# Patient Record
Sex: Male | Born: 1945
Health system: Southern US, Community
[De-identification: ages and names within clinical notes are randomized; demographics above are authoritative.]

## PROBLEM LIST (undated history)

## (undated) DIAGNOSIS — M1990 Unspecified osteoarthritis, unspecified site: Secondary | ICD-10-CM

## (undated) DIAGNOSIS — F329 Major depressive disorder, single episode, unspecified: Secondary | ICD-10-CM

## (undated) DIAGNOSIS — F32A Depression, unspecified: Secondary | ICD-10-CM

## (undated) DIAGNOSIS — G4730 Sleep apnea, unspecified: Secondary | ICD-10-CM

## (undated) DIAGNOSIS — I10 Essential (primary) hypertension: Secondary | ICD-10-CM

## (undated) HISTORY — PX: CHOLECYSTECTOMY: SHX55

---

## 2005-03-21 ENCOUNTER — Ambulatory Visit: Payer: Self-pay | Admitting: Rheumatology

## 2005-05-16 ENCOUNTER — Ambulatory Visit: Payer: Self-pay | Admitting: Orthopaedic Surgery

## 2006-12-22 ENCOUNTER — Inpatient Hospital Stay: Payer: Self-pay | Admitting: General Surgery

## 2006-12-24 ENCOUNTER — Other Ambulatory Visit: Payer: Self-pay

## 2006-12-26 ENCOUNTER — Other Ambulatory Visit: Payer: Self-pay

## 2007-01-17 ENCOUNTER — Inpatient Hospital Stay: Payer: Self-pay | Admitting: Unknown Physician Specialty

## 2007-01-19 ENCOUNTER — Other Ambulatory Visit: Payer: Self-pay

## 2007-03-26 ENCOUNTER — Ambulatory Visit: Payer: Self-pay | Admitting: Unknown Physician Specialty

## 2008-03-13 ENCOUNTER — Ambulatory Visit: Payer: Self-pay | Admitting: Psychiatry

## 2008-03-15 ENCOUNTER — Inpatient Hospital Stay: Payer: Self-pay | Admitting: Unknown Physician Specialty

## 2008-03-19 ENCOUNTER — Other Ambulatory Visit: Payer: Self-pay

## 2008-04-10 ENCOUNTER — Ambulatory Visit: Payer: Self-pay | Admitting: Internal Medicine

## 2008-04-10 ENCOUNTER — Other Ambulatory Visit: Payer: Self-pay

## 2008-04-10 ENCOUNTER — Ambulatory Visit: Payer: Self-pay | Admitting: Psychiatry

## 2008-06-09 ENCOUNTER — Encounter: Payer: Self-pay | Admitting: Psychiatry

## 2008-06-29 ENCOUNTER — Emergency Department: Payer: Self-pay | Admitting: Emergency Medicine

## 2008-07-01 ENCOUNTER — Ambulatory Visit: Payer: Self-pay | Admitting: Urology

## 2008-07-07 ENCOUNTER — Ambulatory Visit: Payer: Self-pay | Admitting: Urology

## 2008-07-08 ENCOUNTER — Encounter: Payer: Self-pay | Admitting: Psychiatry

## 2008-07-15 ENCOUNTER — Ambulatory Visit: Payer: Self-pay | Admitting: Urology

## 2008-07-16 ENCOUNTER — Ambulatory Visit: Payer: Self-pay | Admitting: Urology

## 2008-07-16 ENCOUNTER — Other Ambulatory Visit: Payer: Self-pay

## 2008-07-17 ENCOUNTER — Ambulatory Visit: Payer: Self-pay | Admitting: Urology

## 2008-07-25 ENCOUNTER — Ambulatory Visit: Payer: Self-pay | Admitting: Urology

## 2008-09-08 ENCOUNTER — Ambulatory Visit: Payer: Self-pay | Admitting: Internal Medicine

## 2008-11-10 ENCOUNTER — Ambulatory Visit: Payer: Self-pay | Admitting: Psychiatry

## 2009-04-30 ENCOUNTER — Other Ambulatory Visit: Payer: Self-pay | Admitting: Psychiatry

## 2009-06-03 ENCOUNTER — Other Ambulatory Visit: Payer: Self-pay | Admitting: Psychiatry

## 2009-06-23 ENCOUNTER — Ambulatory Visit: Payer: Self-pay | Admitting: Internal Medicine

## 2009-07-09 ENCOUNTER — Other Ambulatory Visit: Payer: Self-pay | Admitting: Psychiatry

## 2009-07-17 ENCOUNTER — Ambulatory Visit: Payer: Self-pay | Admitting: Cardiology

## 2009-08-18 ENCOUNTER — Encounter: Payer: Self-pay | Admitting: Orthopedic Surgery

## 2009-09-07 ENCOUNTER — Encounter: Payer: Self-pay | Admitting: Orthopedic Surgery

## 2009-09-15 ENCOUNTER — Ambulatory Visit: Payer: Self-pay | Admitting: Orthopedic Surgery

## 2009-10-21 ENCOUNTER — Inpatient Hospital Stay: Payer: Self-pay | Admitting: Psychiatry

## 2009-10-21 ENCOUNTER — Ambulatory Visit: Payer: Self-pay | Admitting: Cardiology

## 2010-02-03 IMAGING — CT CT STONE STUDY
1 of 2 series · 15 of 32 positions shown, 19 images · non-contrast
Comparison: none

REASON FOR EXAM: flank pain        rm 17
COMMENTS:

PROCEDURE:     CT  - CT ABDOMEN /PELVIS WO (STONE)  - June 29, 2008  [DATE]
RESULT:     Indication: Renal stone.
TECHNIQUE: A standard renal stone protocol was performed with sequential 5
mm noncontrasted axial images from the lung bases to the symphysis pubis
with the patient in a supine position.

[Series 2: stone · axial · 0.84mm/px · z∈[-921,-453]mm · 15 of 172 slices shown, 19 images]
[im 8/172  soft-tissue]
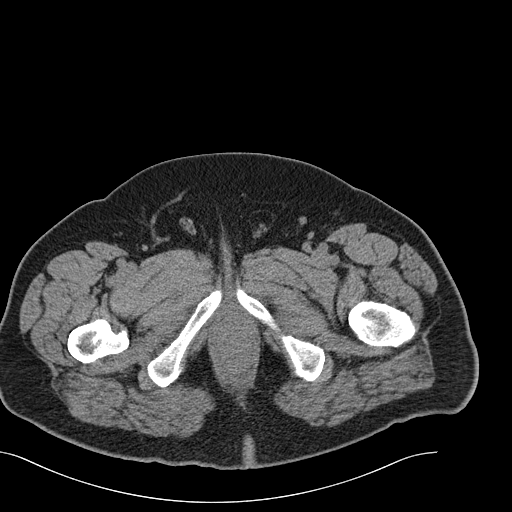
[im 8/172  bone]
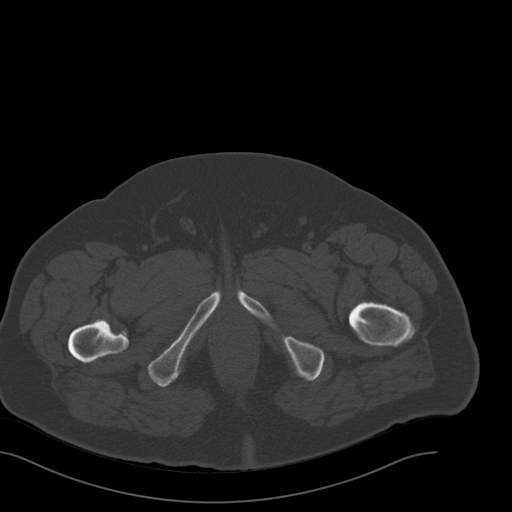
[im 22/172  soft-tissue]
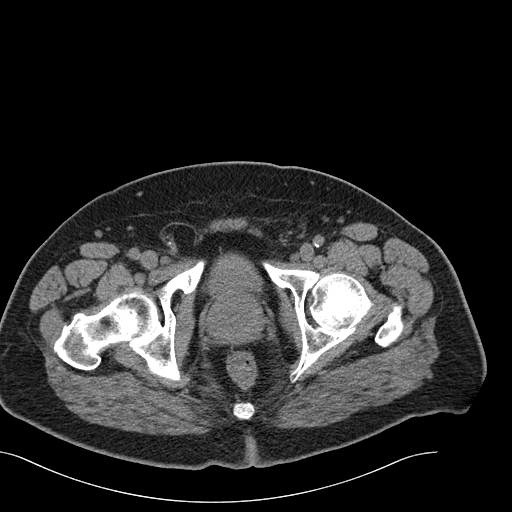
[im 36/172  soft-tissue]
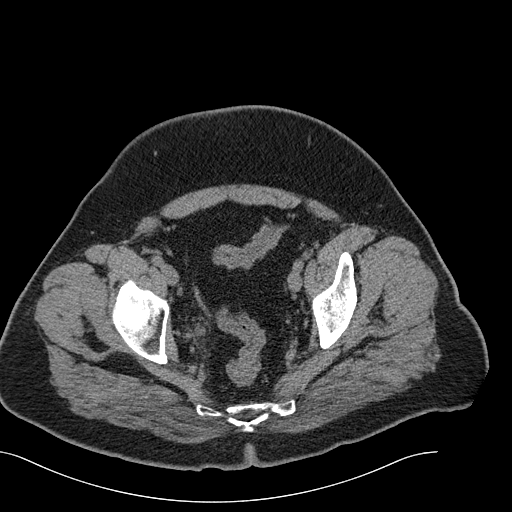
[im 50/172  soft-tissue]
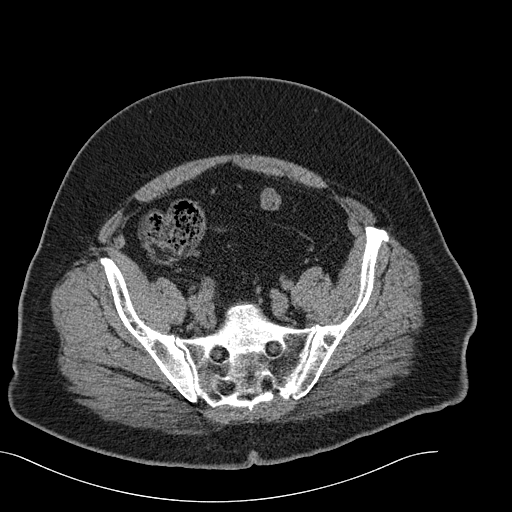
[im 58/172  soft-tissue]
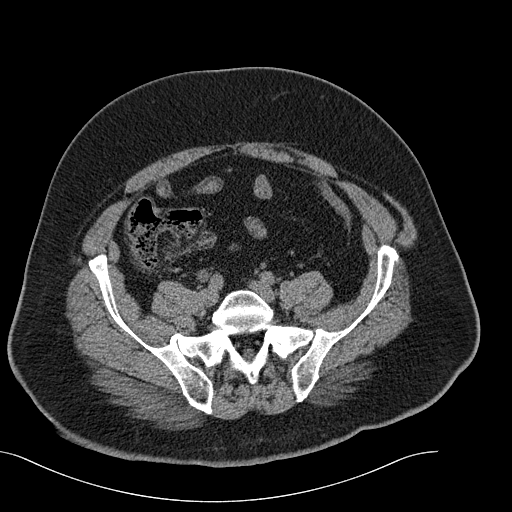
[im 72/172  soft-tissue]
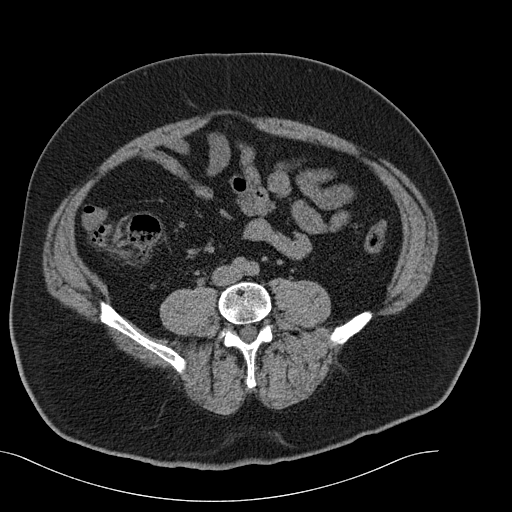
[im 86/172  soft-tissue]
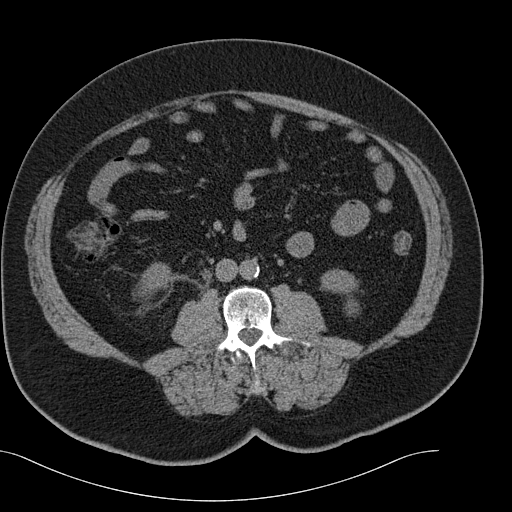
[im 100/172  soft-tissue]
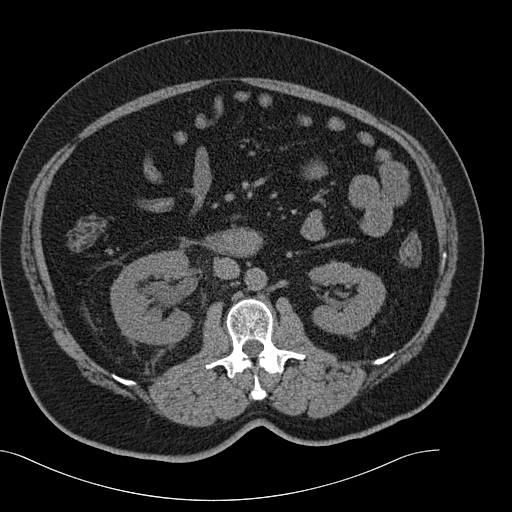
[im 115/172  soft-tissue]
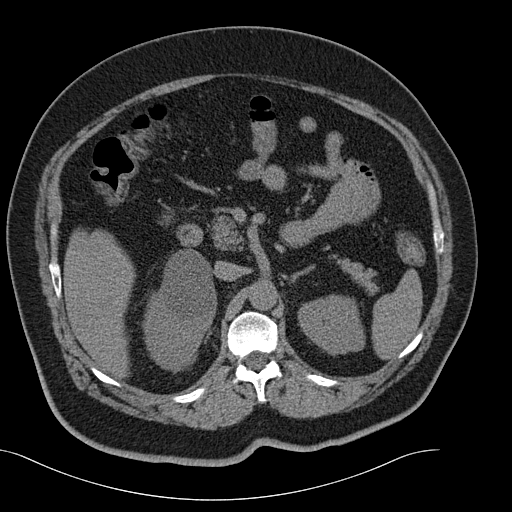
[im 115/172  bone]
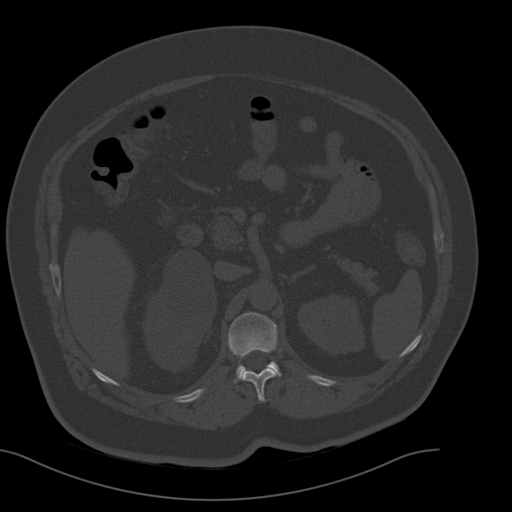
[im 122/172  soft-tissue]
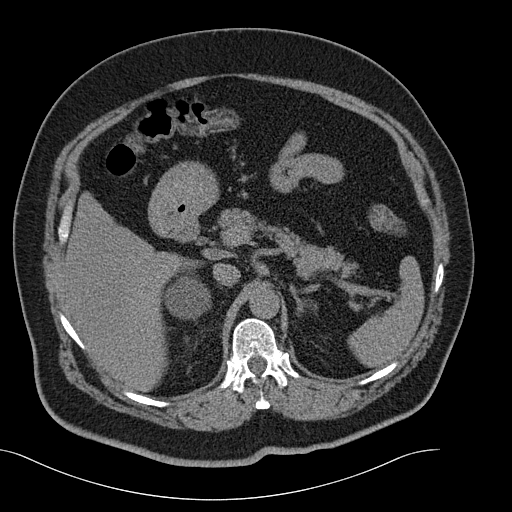
[im 136/172  soft-tissue]
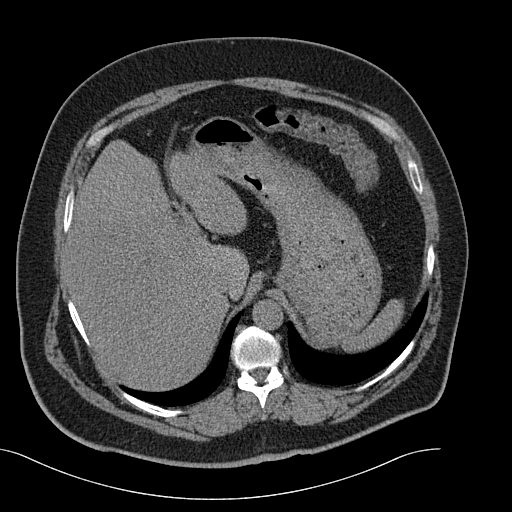
[im 143/172  lung]
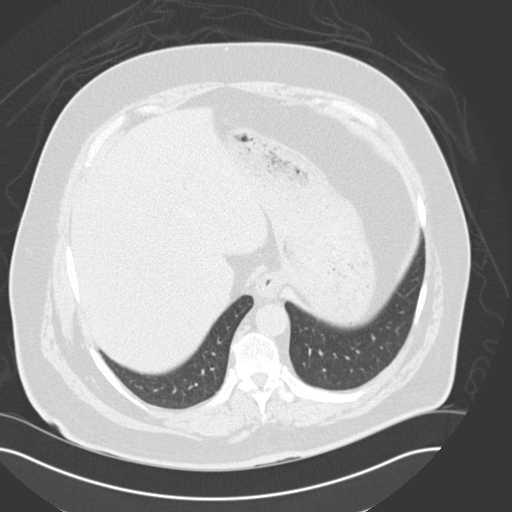
[im 150/172  soft-tissue]
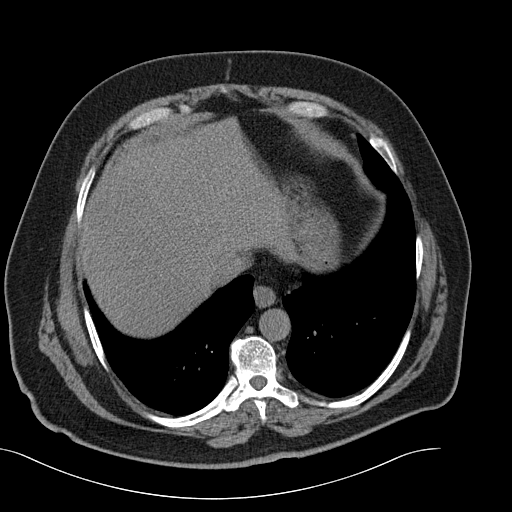
[im 150/172  lung]
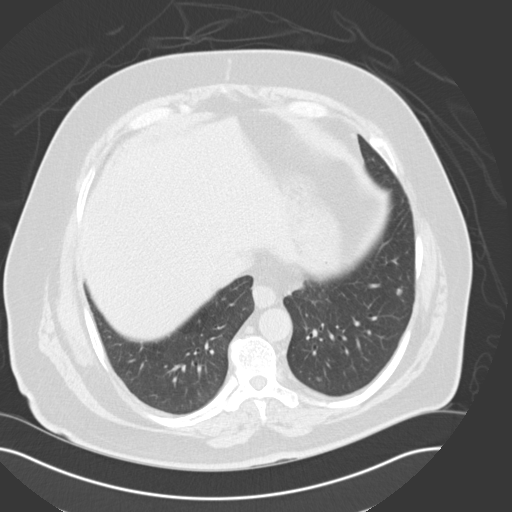
[im 157/172  lung]
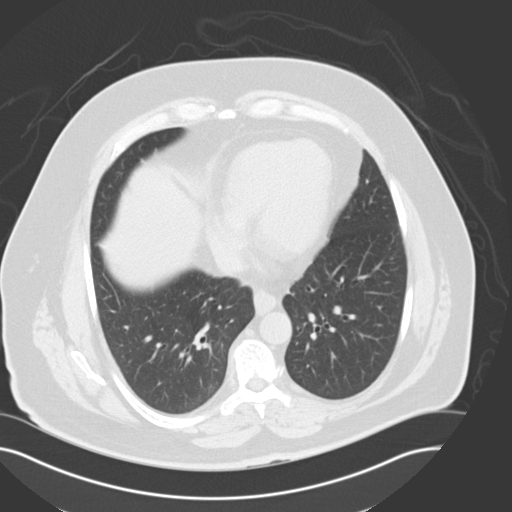
[im 164/172  soft-tissue]
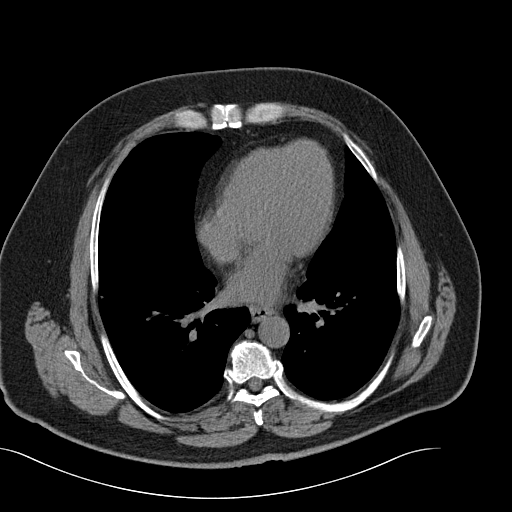
[im 164/172  lung]
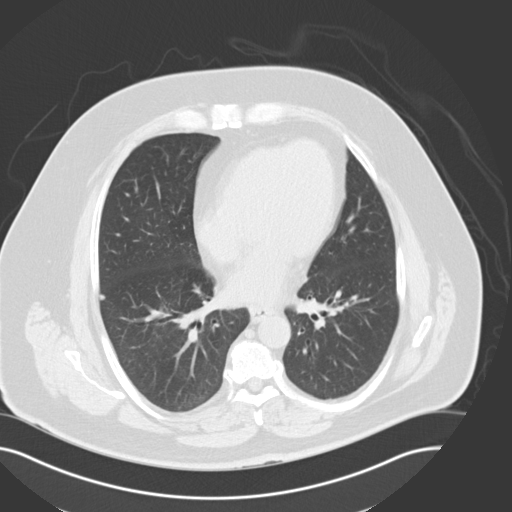

[15 of 32 positions shown; findings below may reference images not displayed]

FINDINGS: Limited views of the lung bases fail to demonstrate abnormal parenchymal
nodules or pleural or pericardial effusions.

There is a 6 mm distal right ureteral obstructing calculus with mild
hydroureter and mild hydronephrosis. The right kidney is mildly enlarged
with perinephric stranding. There are no other renal, ureteral, or bladder
calculi. There is a 6 x 4.7 cm hypodense, fluid attenuating mass arising
from the upper pole of the right kidney likely representing a cyst. There is
a hypodense, fluid attenuating, 2.3 x 2.2 cm left inferior pole renal mass
most consistent with a cyst. The bladder is unremarkable.

The liver demonstrates no focal abnormality. Prior cholecystectomy. The
spleen demonstrates no focal abnormality. The adrenal glands and pancreas
are normal. Mild prostatic enlargement.

The unopacified stomach, duodenum, small intestine, and large intestine are
unremarkable, but evaluation is limited by lack of oral contrast. There is
no pneumoperitoneum, pneumatosis, or portal venous gas. There is no
abdominal or pelvic free fluid. There is no lymphadenopathy.

The abdominal aorta is normal in caliber.

The osseous structures are unremarkable.
IMPRESSION: 1. There is a 6 mm distal right ureteral obstructing calculus with mild
hydroureter and mild hydronephrosis. The right kidney is mildly enlarged
with perinephric stranding.

2. Bilateral renal cysts.

A preliminary interpretation was provided by [REDACTED] on 06/29/2008
at 7534 hours.

## 2010-04-20 ENCOUNTER — Other Ambulatory Visit: Payer: Self-pay | Admitting: Psychiatry

## 2010-11-09 ENCOUNTER — Other Ambulatory Visit: Payer: Self-pay | Admitting: Psychiatry

## 2011-03-01 ENCOUNTER — Other Ambulatory Visit: Payer: Self-pay | Admitting: Psychiatry

## 2011-12-22 ENCOUNTER — Other Ambulatory Visit: Payer: Self-pay | Admitting: Psychiatry

## 2011-12-22 LAB — LIPID PANEL
HDL Cholesterol: 31 mg/dL — ABNORMAL LOW (ref 40–60)
Ldl Cholesterol, Calc: 118 mg/dL — ABNORMAL HIGH (ref 0–100)

## 2012-06-19 ENCOUNTER — Ambulatory Visit: Payer: Self-pay | Admitting: Cardiology

## 2012-06-28 ENCOUNTER — Other Ambulatory Visit: Payer: Self-pay | Admitting: Psychiatry

## 2012-06-28 LAB — LIPID PANEL
Cholesterol: 213 mg/dL — ABNORMAL HIGH (ref 0–200)
Triglycerides: 227 mg/dL — ABNORMAL HIGH (ref 0–200)

## 2012-06-28 LAB — GLUCOSE, RANDOM: Glucose: 111 mg/dL — ABNORMAL HIGH (ref 65–99)

## 2014-06-11 DIAGNOSIS — I10 Essential (primary) hypertension: Secondary | ICD-10-CM | POA: Insufficient documentation

## 2014-06-11 DIAGNOSIS — M1990 Unspecified osteoarthritis, unspecified site: Secondary | ICD-10-CM | POA: Insufficient documentation

## 2014-06-11 DIAGNOSIS — E785 Hyperlipidemia, unspecified: Secondary | ICD-10-CM | POA: Insufficient documentation

## 2014-07-01 DIAGNOSIS — F32A Depression, unspecified: Secondary | ICD-10-CM | POA: Insufficient documentation

## 2014-07-01 DIAGNOSIS — F329 Major depressive disorder, single episode, unspecified: Secondary | ICD-10-CM | POA: Insufficient documentation

## 2015-12-30 DIAGNOSIS — E782 Mixed hyperlipidemia: Secondary | ICD-10-CM | POA: Diagnosis not present

## 2015-12-30 DIAGNOSIS — F329 Major depressive disorder, single episode, unspecified: Secondary | ICD-10-CM | POA: Diagnosis not present

## 2015-12-30 DIAGNOSIS — Z8679 Personal history of other diseases of the circulatory system: Secondary | ICD-10-CM | POA: Diagnosis not present

## 2015-12-30 DIAGNOSIS — G4733 Obstructive sleep apnea (adult) (pediatric): Secondary | ICD-10-CM | POA: Diagnosis not present

## 2015-12-30 DIAGNOSIS — I10 Essential (primary) hypertension: Secondary | ICD-10-CM | POA: Diagnosis not present

## 2016-01-19 DIAGNOSIS — G4733 Obstructive sleep apnea (adult) (pediatric): Secondary | ICD-10-CM | POA: Diagnosis not present

## 2016-01-19 DIAGNOSIS — I10 Essential (primary) hypertension: Secondary | ICD-10-CM | POA: Diagnosis not present

## 2016-01-19 DIAGNOSIS — E6609 Other obesity due to excess calories: Secondary | ICD-10-CM | POA: Diagnosis not present

## 2016-01-19 DIAGNOSIS — F329 Major depressive disorder, single episode, unspecified: Secondary | ICD-10-CM | POA: Diagnosis not present

## 2016-01-19 DIAGNOSIS — E782 Mixed hyperlipidemia: Secondary | ICD-10-CM | POA: Diagnosis not present

## 2016-02-15 ENCOUNTER — Emergency Department
Admission: EM | Admit: 2016-02-15 | Discharge: 2016-02-16 | Disposition: A | Discharge status: Other Acute Inpatient Hospital | Payer: PPO | Attending: Emergency Medicine | Admitting: Emergency Medicine

## 2016-02-15 DIAGNOSIS — R45851 Suicidal ideations: Secondary | ICD-10-CM | POA: Insufficient documentation

## 2016-02-15 DIAGNOSIS — N400 Enlarged prostate without lower urinary tract symptoms: Secondary | ICD-10-CM

## 2016-02-15 DIAGNOSIS — F332 Major depressive disorder, recurrent severe without psychotic features: Secondary | ICD-10-CM | POA: Diagnosis not present

## 2016-02-15 DIAGNOSIS — F329 Major depressive disorder, single episode, unspecified: Secondary | ICD-10-CM | POA: Diagnosis not present

## 2016-02-15 DIAGNOSIS — F32A Depression, unspecified: Secondary | ICD-10-CM

## 2016-02-15 DIAGNOSIS — Z87891 Personal history of nicotine dependence: Secondary | ICD-10-CM | POA: Diagnosis not present

## 2016-02-15 HISTORY — DX: Major depressive disorder, single episode, unspecified: F32.9

## 2016-02-15 HISTORY — DX: Depression, unspecified: F32.A

## 2016-02-15 LAB — CBC
HEMATOCRIT: 42.4 % (ref 40.0–52.0)
Hemoglobin: 14.5 g/dL (ref 13.0–18.0)
MCH: 30.3 pg (ref 26.0–34.0)
MCHC: 34.2 g/dL (ref 32.0–36.0)
MCV: 88.7 fL (ref 80.0–100.0)
Platelets: 249 10*3/uL (ref 150–440)
RBC: 4.78 MIL/uL (ref 4.40–5.90)
RDW: 14.6 % — AB (ref 11.5–14.5)
WBC: 9.5 10*3/uL (ref 3.8–10.6)

## 2016-02-15 LAB — COMPREHENSIVE METABOLIC PANEL
ALBUMIN: 4.5 g/dL (ref 3.5–5.0)
ALK PHOS: 52 U/L (ref 38–126)
ALT: 26 U/L (ref 17–63)
AST: 21 U/L (ref 15–41)
Anion gap: 6 (ref 5–15)
BILIRUBIN TOTAL: 0.4 mg/dL (ref 0.3–1.2)
BUN: 10 mg/dL (ref 6–20)
CALCIUM: 9.2 mg/dL (ref 8.9–10.3)
CO2: 25 mmol/L (ref 22–32)
CREATININE: 0.68 mg/dL (ref 0.61–1.24)
Chloride: 105 mmol/L (ref 101–111)
GFR calc Af Amer: >60 mL/min (ref >60)
GFR calc non Af Amer: >60 mL/min (ref >60)
GLUCOSE: 94 mg/dL (ref 65–99)
Potassium: 4.1 mmol/L (ref 3.5–5.1)
Sodium: 136 mmol/L (ref 135–145)
TOTAL PROTEIN: 7.2 g/dL (ref 6.5–8.1)

## 2016-02-15 LAB — URINE DRUG SCREEN, QUALITATIVE (ARMC ONLY)
AMPHETAMINES, UR SCREEN: NOT DETECTED
BENZODIAZEPINE, UR SCRN: NOT DETECTED
Barbiturates, Ur Screen: NOT DETECTED
Cannabinoid 50 Ng, Ur ~~LOC~~: NOT DETECTED
Cocaine Metabolite,Ur ~~LOC~~: NOT DETECTED
MDMA (Ecstasy)Ur Screen: NOT DETECTED
Methadone Scn, Ur: NOT DETECTED
OPIATE, UR SCREEN: NOT DETECTED
PHENCYCLIDINE (PCP) UR S: NOT DETECTED
Tricyclic, Ur Screen: NOT DETECTED

## 2016-02-15 LAB — ACETAMINOPHEN LEVEL: Acetaminophen (Tylenol), Serum: <10 ug/mL — ABNORMAL LOW (ref 10–30)

## 2016-02-15 LAB — SALICYLATE LEVEL: Salicylate Lvl: <4 mg/dL (ref 2.8–30.0)

## 2016-02-15 LAB — ETHANOL: Alcohol, Ethyl (B): <5 mg/dL (ref <5)

## 2016-02-15 NOTE — ED Notes (Signed)
TTS consult now 

## 2016-02-15 NOTE — ED Notes (Signed)
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
## 2016-02-15 NOTE — ED Notes (Signed)
Pt is here with his wife and daughter, states he is having suicidal thoughts without intent, states he has a hx of depression and was seeing Dr. Claudie Fishermanhin, states he no longer practices and has not been able to establish care since..Marland Kitchen

## 2016-02-15 NOTE — ED Notes (Signed)
Supper provided along with an extra drink   

## 2016-02-15 NOTE — ED Provider Notes (Signed)
[facility] Emergency Department Provider Note  ____________________________________________  Time seen: ~1640  I have reviewed the triage vital signs and the nursing notes.   HISTORY  Chief Complaint Depression and Suicidal   History limited by: Not Limited   HPI Mario Knapp is a 70 y.o. male with history of depression who presents to the emergency department today because of concern for worsening depression. The patient states that he has had depression for years. He states that it has gotten worse recently. States that he is on medication for depression. Had been seeing a psychiatrist who has now retired. Denies any alcohol or drug use.     Past Medical History  Diagnosis Date  . Depression     There are no active problems to display for this patient.   Past Surgical History  Procedure Laterality Date  . Cholecystectomy      No current outpatient prescriptions on file.  Allergies Review of patient's allergies indicates no known allergies.  No family history on file.  Social History Social History  Substance Use Topics  . Smoking status: Former Games developer  . Smokeless tobacco: None  . Alcohol Use: No    Review of Systems  Constitutional: Negative for fever. Cardiovascular: Negative for chest pain. Respiratory: Negative for shortness of breath. Gastrointestinal: Negative for abdominal pain, vomiting and diarrhea. Neurological: Negative for headaches, focal weakness or numbness.  10-point ROS otherwise negative.  ____________________________________________   PHYSICAL EXAM:  VITAL SIGNS: ED Triage Vitals  Enc Vitals Group     BP 02/15/16 1526 133/76 mmHg     Pulse Rate 02/15/16 1526 79     Resp 02/15/16 1526 17     Temp 02/15/16 1526 97.7 F (36.5 C)     Temp Source 02/15/16 1526 Oral     SpO2 02/15/16 1526 95 %     Weight 02/15/16 1526 240 lb (108.863 kg)     Height 02/15/16 1526  (1.727 m)    Constitutional: Alert and oriented. Appears depressed.  Eyes: Conjunctivae are normal. PERRL. Normal extraocular movements. ENT   Head: Normocephalic and atraumatic.   Nose: No congestion/rhinnorhea.   Mouth/Throat: Mucous membranes are moist.   Neck: No stridor. Hematological/Lymphatic/Immunilogical: No cervical lymphadenopathy. Cardiovascular: Normal rate, regular rhythm.  No murmurs, rubs, or gallops. Respiratory: Normal respiratory effort without tachypnea nor retractions. Breath sounds are clear and equal bilaterally. No wheezes/rales/rhonchi. Gastrointestinal: Soft and nontender. No distention.  Genitourinary: Deferred Musculoskeletal: Normal range of motion in all extremities. No joint effusions.  No lower extremity tenderness nor edema. Neurologic:  Normal speech and language. No gross focal neurologic deficits are appreciated.  Skin:  Skin is warm, dry and intact. No rash noted. Psychiatric: Depressed. Tearful.   ____________________________________________    LABS (pertinent positives/negatives)  Labs Reviewed  ACETAMINOPHEN LEVEL - Abnormal; Notable for the following:    Acetaminophen (Tylenol), Serum <10 (*)    All other components within normal limits  CBC - Abnormal; Notable for the following:    RDW 14.6 (*)    All other components within normal limits  COMPREHENSIVE METABOLIC PANEL  ETHANOL  SALICYLATE LEVEL  URINE DRUG SCREEN, QUALITATIVE ([facility] ONLY)     ____________________________________________   EKG  None  ____________________________________________    RADIOLOGY  None  ____________________________________________   PROCEDURES  Procedure(s) performed: None  Critical Care performed: No  ____________________________________________   INITIAL IMPRESSION / ASSESSMENT AND PLAN / ED COURSE  Pertinent labs & imaging results that were available during my  care of the patient were reviewed by me and considered in my medical  decision making (see chart for details).  Patient presented to the emergency department today because of concerns for depression. Does state he has had some suicidal thoughts. Patient is here voluntarily. Patient without a plan. Did not feel that patient warranted IVC at this time. Will have psychiatry see patient.  ____________________________________________   FINAL CLINICAL IMPRESSION(S) / ED DIAGNOSES  Final diagnoses:  Depression     Phineas SemenGraydon Shayana Hornstein, MD 02/15/16 2344

## 2016-02-15 NOTE — ED Notes (Signed)
Pt watching tv.  Pt calm and cooperative.  Alert.

## 2016-02-15 NOTE — ED Notes (Signed)
BEHAVIORAL HEALTH ROUNDING Patient sleeping: No. Patient alert and oriented: yes Behavior appropriate: Yes.  ; If no, describe:  Nutrition and fluids offered: yes Toileting and hygiene offered: Yes  Sitter present: q15 minute observations and security  monitoring Law enforcement present: Yes  ODS  

## 2016-02-15 NOTE — ED Notes (Signed)
Wife visiting pt at this time.

## 2016-02-15 NOTE — ED Notes (Signed)
Pt is on cpap at home and has his machine.  Security and md aware.  Pt has now placed mask on his face to go to bed.  Security and sitter/rover aware.

## 2016-02-15 NOTE — ED Notes (Signed)
Pt observed lying in bed - watching TV   Pt visualized with NAD  No verbalized needs or concerns at this time  Continue to monitor 

## 2016-02-15 NOTE — ED Notes (Signed)
Resumed care from Sophia Cubero t rn.  Pt resting quietly   Calm and cooperative.

## 2016-02-16 ENCOUNTER — Inpatient Hospital Stay
Admission: EM | Admit: 2016-02-16 | Discharge: 2016-02-23 | DRG: 885 | Disposition: A | Discharge status: Home / Self Care | Payer: PPO | Source: Intra-hospital | Attending: Psychiatry | Admitting: Psychiatry

## 2016-02-16 DIAGNOSIS — F332 Major depressive disorder, recurrent severe without psychotic features: Secondary | ICD-10-CM | POA: Diagnosis present

## 2016-02-16 DIAGNOSIS — G4730 Sleep apnea, unspecified: Secondary | ICD-10-CM | POA: Diagnosis present

## 2016-02-16 DIAGNOSIS — R45851 Suicidal ideations: Secondary | ICD-10-CM | POA: Diagnosis present

## 2016-02-16 DIAGNOSIS — N400 Enlarged prostate without lower urinary tract symptoms: Secondary | ICD-10-CM

## 2016-02-16 DIAGNOSIS — M1990 Unspecified osteoarthritis, unspecified site: Secondary | ICD-10-CM | POA: Diagnosis not present

## 2016-02-16 DIAGNOSIS — Z9049 Acquired absence of other specified parts of digestive tract: Secondary | ICD-10-CM

## 2016-02-16 DIAGNOSIS — Z87891 Personal history of nicotine dependence: Secondary | ICD-10-CM

## 2016-02-16 DIAGNOSIS — I499 Cardiac arrhythmia, unspecified: Secondary | ICD-10-CM | POA: Diagnosis not present

## 2016-02-16 DIAGNOSIS — G4700 Insomnia, unspecified: Secondary | ICD-10-CM | POA: Diagnosis present

## 2016-02-16 DIAGNOSIS — E538 Deficiency of other specified B group vitamins: Secondary | ICD-10-CM | POA: Diagnosis not present

## 2016-02-16 DIAGNOSIS — G4733 Obstructive sleep apnea (adult) (pediatric): Secondary | ICD-10-CM | POA: Diagnosis not present

## 2016-02-16 DIAGNOSIS — F419 Anxiety disorder, unspecified: Secondary | ICD-10-CM | POA: Diagnosis not present

## 2016-02-16 DIAGNOSIS — F329 Major depressive disorder, single episode, unspecified: Secondary | ICD-10-CM | POA: Diagnosis not present

## 2016-02-16 HISTORY — DX: Unspecified osteoarthritis, unspecified site: M19.90

## 2016-02-16 HISTORY — DX: Sleep apnea, unspecified: G47.30

## 2016-02-16 MED ORDER — ADULT MULTIVITAMIN W/MINERALS CH
1.0000 | ORAL_TABLET | Freq: Every day | ORAL | Status: DC
  Administered 2016-02-17 – 2016-02-22 (×6): 1 via ORAL
  Filled 2016-02-16 (×6): qty 1

## 2016-02-16 MED ORDER — ESCITALOPRAM OXALATE 10 MG PO TABS
20.0000 mg | ORAL_TABLET | Freq: Two times a day (BID) | ORAL | Status: DC
  Administered 2016-02-16: 20 mg via ORAL
  Filled 2016-02-16: qty 2

## 2016-02-16 MED ORDER — IBUPROFEN 600 MG PO TABS
600.0000 mg | ORAL_TABLET | Freq: Once | ORAL | Status: AC
  Administered 2016-02-16: 600 mg via ORAL

## 2016-02-16 MED ORDER — ALUM & MAG HYDROXIDE-SIMETH 200-200-20 MG/5ML PO SUSP
30.0000 mL | ORAL | Status: DC | PRN
Start: 2016-02-16 — End: 2016-02-23

## 2016-02-16 MED ORDER — MIRTAZAPINE 15 MG PO TABS
45.0000 mg | ORAL_TABLET | Freq: Every day | ORAL | Status: DC
Start: 2016-02-16 — End: 2016-02-22
  Administered 2016-02-16 – 2016-02-21 (×6): 45 mg via ORAL
  Filled 2016-02-16 (×6): qty 3

## 2016-02-16 MED ORDER — MIRTAZAPINE 15 MG PO TABS: 45.0000 mg | ORAL_TABLET | Freq: Every day | ORAL | Status: DC

## 2016-02-16 MED ORDER — ACETAMINOPHEN 325 MG PO TABS
650.0000 mg | ORAL_TABLET | Freq: Four times a day (QID) | ORAL | Status: DC | PRN
  Administered 2016-02-20: 650 mg via ORAL
  Filled 2016-02-16: qty 2

## 2016-02-16 MED ORDER — MIRTAZAPINE 15 MG PO TABS: 30.0000 mg | ORAL_TABLET | Freq: Every day | ORAL | Status: DC

## 2016-02-16 MED ORDER — CLONAZEPAM 0.5 MG PO TABS: 0.5000 mg | ORAL_TABLET | Freq: Two times a day (BID) | ORAL | Status: DC | PRN

## 2016-02-16 MED ORDER — TAMSULOSIN HCL 0.4 MG PO CAPS
0.4000 mg | ORAL_CAPSULE | Freq: Every day | ORAL | Status: DC
  Administered 2016-02-16: 0.4 mg via ORAL
  Filled 2016-02-16: qty 1

## 2016-02-16 MED ORDER — TAMSULOSIN HCL 0.4 MG PO CAPS
0.4000 mg | ORAL_CAPSULE | Freq: Every day | ORAL | Status: DC
  Administered 2016-02-17 – 2016-02-22 (×6): 0.4 mg via ORAL
  Filled 2016-02-16 (×6): qty 1

## 2016-02-16 MED ORDER — ACETAMINOPHEN 325 MG PO TABS
650.0000 mg | ORAL_TABLET | Freq: Once | ORAL | Status: AC
  Administered 2016-02-16: 650 mg via ORAL

## 2016-02-16 MED ORDER — ADULT MULTIVITAMIN W/MINERALS CH
1.0000 | ORAL_TABLET | Freq: Every day | ORAL | Status: DC
  Administered 2016-02-16: 1 via ORAL
  Filled 2016-02-16: qty 1

## 2016-02-16 MED ORDER — IBUPROFEN 600 MG PO TABS
ORAL_TABLET | ORAL | Status: AC
  Administered 2016-02-16: 600 mg via ORAL
  Filled 2016-02-16: qty 1

## 2016-02-16 MED ORDER — ESCITALOPRAM OXALATE 10 MG PO TABS
20.0000 mg | ORAL_TABLET | Freq: Two times a day (BID) | ORAL | Status: DC
  Administered 2016-02-16 – 2016-02-17 (×2): 20 mg via ORAL
  Filled 2016-02-16 (×2): qty 2

## 2016-02-16 MED ORDER — CLONAZEPAM 0.5 MG PO TABS
0.5000 mg | ORAL_TABLET | Freq: Two times a day (BID) | ORAL | Status: DC | PRN
  Administered 2016-02-16: 0.5 mg via ORAL
  Filled 2016-02-16: qty 1

## 2016-02-16 MED ORDER — ACETAMINOPHEN 325 MG PO TABS
ORAL_TABLET | ORAL | Status: AC
  Administered 2016-02-16: 650 mg via ORAL
  Filled 2016-02-16: qty 2

## 2016-02-16 MED ORDER — MAGNESIUM HYDROXIDE 400 MG/5ML PO SUSP: 30.0000 mL | Freq: Every day | ORAL | Status: DC | PRN

## 2016-02-16 NOTE — ED Notes (Signed)
Pt sleeping  cpap in place.

## 2016-02-16 NOTE — Plan of Care (Signed)
Problem: Diagnosis: Increased Risk For Suicide Attempt Goal: LTG-Patient Will Show Positive Response to Medication LTG (by discharge) : Patient will show positive response to medication and will participate in the development of the discharge plan.  Outcome: Progressing AAOX3. Anxious and cooperative. Med compliant. No voiced thoughts of hurting himself. Pt remains free from injury.

## 2016-02-16 NOTE — ED Notes (Signed)
Pt switching his laying positions in bed, tv on. Pt calm and cooperative. No distress noted at this time.

## 2016-02-16 NOTE — ED Notes (Signed)
Psychiatrist at bedside

## 2016-02-16 NOTE — ED Notes (Signed)
Pt given breakfast tray with OJ and Cpap packaged by pt and removed from room. Pt is eating breakfast. [facility].

## 2016-02-16 NOTE — Consult Note (Signed)
[facility] Face-to-Face Psychiatry Consult   Reason for Consult:  Consult for this 70 year old man with a history of major depression came to the emergency room voluntarily because of suicidal threats. Referring Physician:  Cinda Quest Patient Identification: Fredy Gladu MRN:  [id number] Principal Diagnosis: Severe recurrent major depression without psychotic features [facility]) Diagnosis:   Patient Active Problem List   Diagnosis Date Noted  . Severe recurrent major depression without psychotic features ([place]) [F33.2] 02/16/2016  . Suicidal ideation [R45.851] 02/16/2016  . Benign hypertrophy of prostate [N40.0] 02/16/2016    Total Time spent with patient: 1 hour  Subjective:   Damone Fancher is a 70 y.o. male patient admitted with "I just got overwhelmed and threatened to harm myself".  HPI:  Patient interviewed. Chart reviewed. Labs reviewed case discussed with emergency room doctor and TTS. 70 year old man says that he is been feeling more and more overwhelmed recently. Probably been going on for months but it's escalating over the last several days. He feels like he is under a huge amount of stress. He has to care for his elderly mother in his home and finds that to be very stressful. He says that the last straw was this weekend when his daughter yelled at him over something. Patient then admits that he said that he was going to kill himself. He got in his car and went to drive to "talk to my son". The son in question is deceased and apparently the patient had made some comments about joining him. He also made a comment about how if he had a gun he would shoot himself. Has been compliant with his psychiatric medicine but has not had a psychiatric provider since his doctor retired a few months ago. He denies that he's been drinking or abusing any drugs.  Social history: Patient lives with his wife. Sounds like he doesn't talk to her much about what is stressing him. He has 4 children one of whom is  deceased. Sounds like the others often get on his nerves and fuss with him or at least that's how he sees it. His elderly mother lives with him. He complains of taking care of her because he says that she never cared about him when he was a child anyway.  Substance abuse history: Patient denies any alcohol or drug abuse now or in the past.  Medical history: History of enlarged prostate. Takes Flomax.. Otherwise denies any ongoing medical problems    Past Psychiatric History: Patient admits that he's had 2 prior psychiatric hospitalizations. They predate the records in our computer but he says both of them happened at [facility]. He denies that he has tried to kill himself in the past. Admits that he's made suicidal statements before. He was seeing Dr. Ulis Rias in the community for years for outpatient treatment but Dr. Bridgett Larsson retired and moved away a few months ago and the patient has not been able to find a doctor to substitute. He takes a combination of Lexapro and Remeron.  Risk to Self: Suicidal Ideation: Yes-Currently Present Suicidal Intent: Yes-Currently Present Is patient at risk for suicide?: Yes (Currently in the hospital) Suicidal Plan?: Yes-Currently Present Specify Current Suicidal Plan: vehicular suicide Access to Means: No (Currently in the hospital) What has been your use of drugs/alcohol within the last 12 months?: denied use How many times?: 0 Other Self Harm Risks: denied Triggers for Past Attempts: None known Intentional Self Injurious Behavior: None Risk to Others: Homicidal Ideation: No Thoughts of Harm  to Others: No Current Homicidal Intent: No Current Homicidal Plan: No Access to Homicidal Means: No Identified Victim: none identified History of harm to others?: No Assessment of Violence: None Noted Violent Behavior Description: denied Does patient have access to weapons?: No Criminal Charges Pending?: No Does patient have a court date:  No Prior Inpatient Therapy: Prior Inpatient Therapy: Yes Prior Therapy Dates: 2009 Prior Therapy Facilty/Provider(s): [facility] Reason for Treatment: Depression Prior Outpatient Therapy: Prior Outpatient Therapy: Yes Prior Therapy Dates: 2016 Prior Therapy Facilty/Provider(s): Dr. Bridgett Larsson Reason for Treatment: Depression Does patient have an ACCT team?: No Does patient have Intensive In-House Services?  : No Does patient have [facility] services? : No Does patient have P4CC services?: No  Past Medical History:  Past Medical History  Diagnosis Date  . Depression     Past Surgical History  Procedure Laterality Date  . Cholecystectomy     Family History: No family history on file. Family Psychiatric  History: Patient says that both of his daughters have major depression known in his family has killed themselves and there is no family history of alcohol or drug abuse Social History:  History  Alcohol Use No     History  Drug Use No    Social History   Social History  . Marital Status: Married    Spouse Name: N/A  . Number of Children: N/A  . Years of Education: N/A   Social History Main Topics  . Smoking status: Former Research scientist (life sciences)  . Smokeless tobacco: None  . Alcohol Use: No  . Drug Use: No  . Sexual Activity: Not Asked   Other Topics Concern  . None   Social History Narrative  . None   Additional Social History:    Allergies:  No Known Allergies  Labs:  Results for orders placed or performed during the hospital encounter of 02/15/16 (from the past 48 hour(s))  Comprehensive metabolic panel     Status: None   Collection Time: 02/15/16  3:33 PM  Result Value Ref Range   Sodium 136 135 - 145 mmol/L   Potassium 4.1 3.5 - 5.1 mmol/L   Chloride 105 101 - 111 mmol/L   CO2 25 22 - 32 mmol/L   Glucose, Bld 94 65 - 99 mg/dL   BUN 10 6 - 20 mg/dL   Creatinine, Ser 0.68 0.61 - 1.24 mg/dL   Calcium 9.2 8.9 - 10.3 mg/dL   Total Protein 7.2 6.5 - 8.1 g/dL   Albumin 4.5 3.5 -  5.0 g/dL   AST 21 15 - 41 U/L   ALT 26 17 - 63 U/L   Alkaline Phosphatase 52 38 - 126 U/L   Total Bilirubin 0.4 0.3 - 1.2 mg/dL   GFR calc non Af Amer >60 >60 mL/min   GFR calc Af Amer >60 >60 mL/min    Comment: (NOTE) The eGFR has been calculated using the CKD EPI equation. This calculation has not been validated in all clinical situations. eGFR's persistently <60 mL/min signify possible Chronic Kidney Disease.    Anion gap 6 5 - 15  Ethanol (ETOH)     Status: None   Collection Time: 02/15/16  3:33 PM  Result Value Ref Range   Alcohol, Ethyl (B) <5 <5 mg/dL    Comment:        LOWEST DETECTABLE LIMIT FOR SERUM ALCOHOL IS 5 mg/dL FOR MEDICAL PURPOSES ONLY   Salicylate level     Status: None   Collection Time: 02/15/16  3:33 PM  Result Value Ref Range   Salicylate Lvl <7.8 2.8 - 30.0 mg/dL  Acetaminophen level     Status: Abnormal   Collection Time: 02/15/16  3:33 PM  Result Value Ref Range   Acetaminophen (Tylenol), Serum <10 (L) 10 - 30 ug/mL    Comment:        THERAPEUTIC CONCENTRATIONS VARY SIGNIFICANTLY. A RANGE OF 10-30 ug/mL MAY BE AN EFFECTIVE CONCENTRATION FOR MANY PATIENTS. HOWEVER, SOME ARE BEST TREATED AT CONCENTRATIONS OUTSIDE THIS RANGE. ACETAMINOPHEN CONCENTRATIONS >150 ug/mL AT 4 HOURS AFTER INGESTION AND >50 ug/mL AT 12 HOURS AFTER INGESTION ARE OFTEN ASSOCIATED WITH TOXIC REACTIONS.   CBC     Status: Abnormal   Collection Time: 02/15/16  3:33 PM  Result Value Ref Range   WBC 9.5 3.8 - 10.6 K/uL   RBC 4.78 4.40 - 5.90 MIL/uL   Hemoglobin 14.5 13.0 - 18.0 g/dL   HCT 42.4 40.0 - 52.0 %   MCV 88.7 80.0 - 100.0 fL   MCH 30.3 26.0 - 34.0 pg   MCHC 34.2 32.0 - 36.0 g/dL   RDW 14.6 (H) 11.5 - 14.5 %   Platelets 249 150 - 440 K/uL  Urine Drug Screen, Qualitative ([facility] only)     Status: None   Collection Time: 02/15/16  3:33 PM  Result Value Ref Range   Tricyclic, Ur Screen NONE DETECTED NONE DETECTED   Amphetamines, Ur Screen NONE DETECTED NONE  DETECTED   MDMA (Ecstasy)Ur Screen NONE DETECTED NONE DETECTED   Cocaine Metabolite,Ur [place] NONE DETECTED NONE DETECTED   Opiate, Ur Screen NONE DETECTED NONE DETECTED   Phencyclidine (PCP) Ur S NONE DETECTED NONE DETECTED   Cannabinoid 50 Ng, Ur [place] NONE DETECTED NONE DETECTED   Barbiturates, Ur Screen NONE DETECTED NONE DETECTED   Benzodiazepine, Ur Scrn NONE DETECTED NONE DETECTED   Methadone Scn, Ur NONE DETECTED NONE DETECTED    Comment: (NOTE) 469  Tricyclics, urine               Cutoff 1000 ng/mL 200  Amphetamines, urine             Cutoff 1000 ng/mL 300  MDMA (Ecstasy), urine           Cutoff 500 ng/mL 400  Cocaine Metabolite, urine       Cutoff 300 ng/mL 500  Opiate, urine                   Cutoff 300 ng/mL 600  Phencyclidine (PCP), urine      Cutoff 25 ng/mL 700  Cannabinoid, urine              Cutoff 50 ng/mL 800  Barbiturates, urine             Cutoff 200 ng/mL 900  Benzodiazepine, urine           Cutoff 200 ng/mL 1000 Methadone, urine                Cutoff 300 ng/mL 1100 1200 The urine drug screen provides only a preliminary, unconfirmed 1300 analytical test result and should not be used for non-medical 1400 purposes. Clinical consideration and professional judgment should 1500 be applied to any positive drug screen result due to possible 1600 interfering substances. A more specific alternate chemical method 1700 must be used in order to obtain a confirmed analytical result.  1800 Gas chromato graphy / mass spectrometry (GC/MS) is the preferred 1900 confirmatory method.     Current Facility-Administered Medications  Medication Dose Route Frequency Provider Last Rate Last Dose  . clonazePAM (KLONOPIN) tablet 0.5 mg  0.5 mg Oral BID PRN Nena Polio, MD      . escitalopram (LEXAPRO) tablet 20 mg  20 mg Oral BID Nena Polio, MD   20 mg at 02/16/16 1058  . mirtazapine (REMERON) tablet 45 mg  45 mg Oral QHS Gonzella Lex, MD      . multivitamin with minerals tablet 1  tablet  1 tablet Oral Daily Nena Polio, MD   1 tablet at 02/16/16 1057  . tamsulosin (FLOMAX) capsule 0.4 mg  0.4 mg Oral QPC supper Nena Polio, MD       Current Outpatient Prescriptions  Medication Sig Dispense Refill  . clonazePAM (KLONOPIN) 0.5 MG tablet Take 0.5 mg by mouth 2 (two) times daily as needed for anxiety.    Marland Kitchen escitalopram (LEXAPRO) 20 MG tablet Take 20 mg by mouth 2 (two) times daily.    . mirtazapine (REMERON) 30 MG tablet Take 30 mg by mouth at bedtime.    . Multiple Vitamin (MULTIVITAMIN WITH MINERALS) TABS tablet Take 1 tablet by mouth daily.    . tamsulosin (FLOMAX) 0.4 MG CAPS capsule Take 0.4 mg by mouth daily after supper.      Musculoskeletal: Strength & Muscle Tone: within normal limits Gait & Station: normal Patient leans: N/A  Psychiatric Specialty Exam: Review of Systems  Constitutional: Negative.   HENT: Negative.   Eyes: Negative.   Respiratory: Negative.   Cardiovascular: Negative.   Gastrointestinal: Negative.   Musculoskeletal: Negative.   Skin: Negative.   Neurological: Negative.   Psychiatric/Behavioral: Positive for depression and suicidal ideas. Negative for hallucinations, memory loss and substance abuse. The patient is nervous/anxious and has insomnia.     Blood pressure 126/74, pulse 62, temperature 98.4 F (36.9 C), temperature source Oral, resp. rate 17, height '5\' 8"'$  (1.727 m), weight 108.863 kg (240 lb), SpO2 94 %.Body mass index is 36.5 kg/(m^2).  General Appearance: Fairly Groomed  Engineer, water::  Fair  Speech:  Slow  Volume:  Decreased  Mood:  Dysphoric  Affect:  Depressed  Thought Process:  Goal Directed  Orientation:  Full (Time, Place, and Person)  Thought Content:  Negative  Suicidal Thoughts:  Yes.  with intent/plan  Homicidal Thoughts:  No  Memory:  Immediate;   Good Recent;   Fair Remote;   Good  Judgement:  Fair  Insight:  Fair  Psychomotor Activity:  Decreased  Concentration:  Fair  Recall:  [employer]  of Knowledge:Fair  Language: Fair  Akathisia:  No  Handed:  Right  AIMS (if indicated):     Assets:  Communication Skills Desire for Improvement Financial Resources/Insurance Housing Physical Health Resilience Social Support  ADL's:  Intact  Cognition: WNL  Sleep:      Treatment Plan Summary: Daily contact with patient to assess and evaluate symptoms and progress in treatment, Medication management and Plan 70 year old man with a history of recurrent severe major depression and had made suicidal threats yesterday and today. He remains very depressed and down with poor sleep or appetite generally poor functioning. This despite the fact that he's been compliant with his medicine. Doesn't really have an outpatient provider in place. Patient is not under commitment but I think that he does require inpatient treatment for safety. Patient was agreeable to the idea of voluntarily being admitted to the hospital. Although he is 70 years old he is in relatively good  health and I think it's appropriate to admit him to our facility he is perfectly ambulatory. Patient is agreeable to the plan. Restart an antidepressant medicine. Admission orders will be completed.  Disposition: Recommend psychiatric Inpatient admission when medically cleared. Supportive therapy provided about ongoing stressors.  Alethia Berthold, MD 02/16/2016 4:34 PM

## 2016-02-16 NOTE — Progress Notes (Signed)
[facility] by Dr. Toni Amendlapacs.  Attending Physician will be Dr. Jenne CampusMcQueen.   Patient [facility] room 309[facility] Gwen.   Intake Paper Work has been signed and placed on patient chart.  ER staff is aware of the admission ( [facility].; ER MD; Corrie DandyMary Patient's Nurse & Byrd HesselbachMaria Patient Access).  Please call report after 7 PM    02/16/2016 Cheryl FlashNicole Journei Thomassen, MS, NCC, LPCA Therapeutic Triage Specialist

## 2016-02-16 NOTE — ED Notes (Signed)
Pt up to bathroom.

## 2016-02-16 NOTE — ED Notes (Signed)
Daughter in to visit.

## 2016-02-16 NOTE — ED Notes (Signed)
Pt provided water per pt request. Pt sitting on bed drinking water. Pt calm, cooperative at this time, no distress noted.

## 2016-02-16 NOTE — ED Notes (Signed)
Pt lying in bed watching tv 

## 2016-02-16 NOTE — ED Notes (Signed)
Pt lying in bed watching TV. Talked briefly about his grandchildren.

## 2016-02-16 NOTE — ED Notes (Signed)
Pt wife, Christie Beckerseggy Raulerson, states he takes clonazepam 2 times a day not 2 times a day PRN.

## 2016-02-16 NOTE — ED Notes (Signed)
Pt family into visit.

## 2016-02-16 NOTE — ED Notes (Signed)
Pt given lunch. Pt has eaten and is lying in bed watching TV.

## 2016-02-16 NOTE — ED Provider Notes (Signed)
-----------------------------------------   6:41 AM on 02/16/2016 -----------------------------------------   Blood pressure 104/54, pulse 55, temperature 97.7 F (36.5 C), temperature source Oral, resp. rate 20, height 5\' 8"  (1.727 m), weight 240 lb (108.863 kg), SpO2 96 %.  The patient had no acute events since last update.  Calm and cooperative at this time.  Disposition is pending per Psychiatry/Behavioral Medicine team recommendations.     Irean HongJade J Adeola Dennen, MD 02/16/16 [phone]

## 2016-02-16 NOTE — ED Notes (Signed)
[facility]. Will be able to call report after 7PM

## 2016-02-16 NOTE — Progress Notes (Signed)
Pt being reviewed for possible admission to [facility]. H&P and Assessment have been faxed to the BHU for the charge nurse to review and provide bed assignment.      Nicole Jams Trickett, MS, NCC, LPCA Therapeutic Triage Specialist    

## 2016-02-16 NOTE — Tx Team (Signed)
Initial Interdisciplinary Treatment Plan   PATIENT STRESSORS: Marital or family conflict Medication change or noncompliance   PATIENT STRENGTHS: Ability for insight Communication skills   PROBLEM LIST: Problem List/Patient Goals Date to be addressed Date deferred Reason deferred Estimated date of resolution  depression 04/[id number]     suicidal ideation                                                 DISCHARGE CRITERIA:  Improved stabilization in mood, thinking, and/or behavior Verbal commitment to aftercare and medication compliance  PRELIMINARY DISCHARGE PLAN: Return to previous living arrangement  PATIENT/FAMIILY INVOLVEMENT: This treatment plan has been presented to and reviewed with the patient, Windell HummingbirdJames Alvin Googe, and/or family member.  The patient and family have been given the opportunity to ask questions and make suggestions.  Foster Simpsonrina Zayveon Raschke 02/16/2016, 10:58 PM

## 2016-02-16 NOTE — ED Notes (Signed)
Pt ambulated to bathroom and ask for something for headache and wanted info about the meds he normally takes. RN notified and working on it.

## 2016-02-16 NOTE — Progress Notes (Addendum)
AAOX3. Patient is 70 year old WM admitted to the unit for depression w/SI, no plan. H/O Depression, Sleep apnea and arthritis in both hands. Reports feeling overwhelmed dt multiple family issues. States he stopped taking his medications because of the cost. Pt is anxious. cooperative. Per RN, Blenda MountsJeanina, pt takes Klonopin 0.5 mg 2 times daily instead of PRN. Reports taking Flomax daily unsure of the dosage. multiple moles across torso,back and buttock. No open areas noted. Med compliant. CPAP at bedside. Respiratory aware. Denies SI/HI/AV/H. Pt searched for contraband non found, skin checked with another nurse. Skin warm, dry and intact. No c/o pain/discomfort noted.

## 2016-02-16 NOTE — ED Notes (Signed)
Pt resting and watching TV °

## 2016-02-16 NOTE — ED Notes (Signed)
Supper tray given to pt. Pt is eating.

## 2016-02-16 NOTE — ED Notes (Signed)
Pt being discharged from ED and admitted to Behavioral Medicine.

## 2016-02-17 DIAGNOSIS — F332 Major depressive disorder, recurrent severe without psychotic features: Secondary | ICD-10-CM | POA: Diagnosis not present

## 2016-02-17 DIAGNOSIS — I499 Cardiac arrhythmia, unspecified: Secondary | ICD-10-CM | POA: Diagnosis not present

## 2016-02-17 LAB — FOLATE: Folate: 19.5 ng/mL (ref >5.9)

## 2016-02-17 LAB — TSH: TSH: 2.997 u[IU]/mL (ref 0.350–4.500)

## 2016-02-17 MED ORDER — ESCITALOPRAM OXALATE 10 MG PO TABS
20.0000 mg | ORAL_TABLET | Freq: Every day | ORAL | Status: DC
  Administered 2016-02-18 – 2016-02-23 (×6): 20 mg via ORAL
  Filled 2016-02-17 (×6): qty 2

## 2016-02-17 MED ORDER — ARIPIPRAZOLE 5 MG PO TABS
2.5000 mg | ORAL_TABLET | Freq: Every day | ORAL | Status: DC
  Administered 2016-02-17 – 2016-02-18 (×2): 2.5 mg via ORAL
  Filled 2016-02-17 (×3): qty 1

## 2016-02-17 NOTE — [facility] Group Notes (Signed)
[facility]  02/17/2016 3:54 PM  Type of Therapy:  Group Therapy  Participation Level:  Did Not Attend    Glennon MacLaws, Latha Staunton P, MSW, LCSW 02/17/2016, 3:54 PM

## 2016-02-17 NOTE — [facility] Group Notes (Signed)
[facility] Group Notes:  (Nursing/MHT/Case Management/Adjunct)  Date:  02/17/2016  Time:  3:52 PM  Type of Therapy:  Psychoeducational Skills  Participation Level:  Did Not Attend  Maybelline Kolarik C Adison Reifsteck 02/17/2016, 3:52 PM 

## 2016-02-17 NOTE — Progress Notes (Addendum)
Patient with appropriate affect, very talkative, cooperative behavior with meals, meds and plan of care. No SI/HI/AVH at this time. Patient calls wife from phone to request she bring clothes for him. Patient tells all staff that his wife works in the pharmacy. Writer informs patient of HIPPA law and how he may not want to tell everyone about his wife working here and he verbalizes understanding. Patient is excited he will have a new grandchild in August. Goal today is to attend therapy groups. Safety maintained. Reports he showered today and that his mood is improving.
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
## 2016-02-17 NOTE — [facility] Group Notes (Signed)
[facility] LCSW Group Therapy  02/17/2016 3:53 PM  Type of Therapy:  Group Therapy  Participation Level:  Minimal  Participation Quality:  Appropriate  Affect:  Appropriate  Cognitive:  Appropriate  Insight:  Limited  Engagement in Therapy:  Improving  Modes of Intervention:  Activity, Discussion, Socialization and Support  Summary of Progress/Problems:Pt attended and participated in morning activity where group discussed incongruence of the way we allow ourselves to be perceived versus the real us on the inside.  Group completed Inside/outside activity decorating outside of paper bag with other's perceptions or what we allow to show and filled the inside with images and words that describe the true inside.  Group discussed the discomfort these incongruent parts can cause and strategies to begin to make them match.  Glennon MacLaws, Antuane Eastridge P, MSW, LCSW 02/17/2016, 3:53 PM

## 2016-02-18 DIAGNOSIS — F332 Major depressive disorder, recurrent severe without psychotic features: Secondary | ICD-10-CM | POA: Diagnosis not present

## 2016-02-18 LAB — HEMOGLOBIN A1C: HEMOGLOBIN A1C: 5.4 % (ref 4.0–6.0)

## 2016-02-18 LAB — VITAMIN B12: VITAMIN B 12: 224 pg/mL (ref 180–914)

## 2016-02-18 MED ORDER — ARIPIPRAZOLE 5 MG PO TABS
5.0000 mg | ORAL_TABLET | Freq: Every day | ORAL | Status: DC
  Administered 2016-02-19 – 2016-02-23 (×5): 5 mg via ORAL
  Filled 2016-02-18 (×5): qty 1

## 2016-02-18 NOTE — Plan of Care (Signed)
Problem: Diagnosis: Increased Risk For Suicide Attempt Goal: LTG-Patient Will Show Positive Response to Medication LTG (by discharge) : Patient will show positive response to medication and will participate in the development of the discharge plan.  Outcome: Progressing Pt progressing towards accomplishing this goal aeb denying any suicide ideations.

## 2016-02-18 NOTE — Progress Notes (Signed)
Pt is alert and oriented this shift, attended HS group with good participation. Pt is in a pleasant mood; denies any SI/HI/VAH; started on Abilify 2.5mg  this HS; no sign of adverse effect noted. Pt remains complaint with medications, will monitor closely for safety.
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
## 2016-02-18 NOTE — Progress Notes (Signed)
[facility] MD Progress Note  02/18/2016 11:08 AM Mario Knapp  MRN:  [id number]   Subjective:  Pt admitted for worsening depression. Nursing notes   Today on interview, pt is resting in bed and is easily awakened. He speaks readily with this provider. He rates his depression as a 4/10 with 10 being the worst depression. He rated admission depression as 10/10 on the same scale. He also rates excessive worry as a 5-6/10 today with admission worry at 12/10.   He agreed to take aripiprazole yesterday. Pt denies side effects from this medication. He denies TD, akathisia and EPS. He did notes some issues sleeping last night with 4 nighttime awakenings but with 3 awakenings, he returned to sleep fairly quickly. The 4th awakening, he was awake for a while. Pt shared frequent nighttime awakenings are rare.   Pt shared his past therapist was [place] [facility] [place], [place]. He would like to see him again if possible.   Pt continues to endorses worthlessness. He felt hopeless at times today. He denies feeling helpless. He feels is appetite is stable. Mood is reactive. Pt denies SI, HI and AVH.   Principal Problem: Severe recurrent major depression without psychotic features (HCC) Diagnosis:   Patient Active Problem List   Diagnosis Date Noted  . Severe recurrent major depression without psychotic features (HCC) [F33.2] 02/16/2016  . Suicidal ideation [R45.851] 02/16/2016  . Benign hypertrophy of prostate [N40.0] 02/16/2016   Total Time spent with patient: 30 minutes   Past Medical History:  Past Medical History  Diagnosis Date  . Depression   . Sleep apnea   . Arthritis     Past Surgical History  Procedure Laterality Date  . Cholecystectomy     Family History: History reviewed. No pertinent family history. Social History:  History  Alcohol Use No     History  Drug Use No    Social History   Social History  . Marital Status: Married    Spouse Name: N/A  .  Number of Children: N/A  . Years of Education: N/A   Social History Main Topics  . Smoking status: Former Smoker    Types: Cigarettes  . Smokeless tobacco: Never Used  . Alcohol Use: No  . Drug Use: No  . Sexual Activity: Not Currently   Other Topics Concern  . None   Social History Narrative   Additional History:    Sleep: Fair  Appetite:  Good   Assessment:   Musculoskeletal: Strength & Muscle Tone: within normal limits Gait & Station: normal Patient leans: N/A   Psychiatric Specialty Exam: Physical Exam  ROS  Blood pressure 114/77, pulse 84, temperature 97.9 F (36.6 C), temperature source Oral, resp. rate 20, height 5\' 8"  (1.727 m), weight 108.863 kg (240 lb).Body mass index is 36.5 kg/(m^2).  General Appearance: Fairly Groomed and wearing hospital scrubs  Eye Contact::  Good  Speech:  Clear and Coherent and Normal Rate  Volume:  Normal  Mood:  Depressed and but reactive  Affect:  Constricted  Thought Process:  Coherent, Goal Directed, Intact, Linear and Logical  Orientation:  Full (Time, Place, and Person)  Thought Content:  Rumination  Suicidal Thoughts:  No  Homicidal Thoughts:  No  Memory:  Immediate;   Good Recent;   Good Remote;   Good  Judgement:  Fair  Insight:  Fair  Psychomotor Activity:  Normal  Concentration:  Good  Recall:  Good  Fund of Knowledge:Good  Language:  Good  Cognition: WNL    Akathisia:  No             Current Medications: Current Facility-Administered Medications  Medication Dose Route Frequency Provider Last Rate Last Dose  . acetaminophen (TYLENOL) tablet 650 mg  650 mg Oral Q6H PRN Audery Amel, MD      . alum & mag hydroxide-simeth (MAALOX/MYLANTA) 200-200-20 MG/5ML suspension 30 mL  30 mL Oral Q4H PRN Audery Amel, MD      . ARIPiprazole (ABILIFY) tablet 2.5 mg  2.5 mg Oral Daily Gena Fray, MD   2.5 mg at 02/18/16 0909  . clonazePAM (KLONOPIN) tablet 0.5 mg  0.5 mg Oral BID PRN Audery Amel, MD   0.5  mg at 02/16/16 2124  . escitalopram (LEXAPRO) tablet 20 mg  20 mg Oral Daily Gena Fray, MD   20 mg at 02/18/16 0909  . magnesium hydroxide (MILK OF MAGNESIA) suspension 30 mL  30 mL Oral Daily PRN Audery Amel, MD      . mirtazapine (REMERON) tablet 45 mg  45 mg Oral QHS Audery Amel, MD   45 mg at 02/17/16 2144  . multivitamin with minerals tablet 1 tablet  1 tablet Oral Daily Audery Amel, MD   1 tablet at 02/18/16 0909  . tamsulosin (FLOMAX) capsule 0.4 mg  0.4 mg Oral QPC supper Audery Amel, MD   0.4 mg at 02/17/16 1800    Lab Results:  Results for orders placed or performed during the hospital encounter of 02/16/16 (from the past 48 hour(s))  TSH     Status: None   Collection Time: 02/17/16  9:14 PM  Result Value Ref Range   TSH 2.997 0.350 - 4.500 uIU/mL  Folate     Status: None   Collection Time: 02/17/16  9:14 PM  Result Value Ref Range   Folate 19.5 >5.9 ng/mL  Hemoglobin A1c     Status: None   Collection Time: 02/17/16  9:14 PM  Result Value Ref Range   Hgb A1c MFr Bld 5.4 4.0 - 6.0 %    Physical Findings: AIMS: Facial and Oral Movements Muscles of Facial Expression: None, normal Lips and Perioral Area: None, normal Jaw: None, normal Tongue: None, normal,Extremity Movements Upper (arms, wrists, hands, fingers): None, normal Lower (legs, knees, ankles, toes): None, normal, Trunk Movements Neck, shoulders, hips: None, normal, Overall Severity Severity of abnormal movements (highest score from questions above): None, normal Incapacitation due to abnormal movements: None, normal Patient's awareness of abnormal movements (rate only patient's report): No Awareness, Dental Status Current problems with teeth and/or dentures?: No Does patient usually wear dentures?: No  CIWA:  CIWA-Ar Total: 0  Treatment Plan Summary: Daily contact with patient to assess and evaluate symptoms and progress in treatment and Medication management  1. MDD, recurrent, severe and  unspecified anxiety disorder: - Continue mirtazapine  po QHS. If it causes interruption in sleep, will change dosing to Qam.  - Continue escitalopram to  po QD - increase aripiprazole to  po QD on 02-19-16.  - Pt will need psychotherapy and psychiatric appts at discharge.   2. Diet: regular  3. Observation: 15 minute checks  4. Precautions: Suicide  Medical Decision Making:  Established Problem, Stable/Improving (1), Review of Psycho-Social Stressors (1), Review or order clinical lab tests (1), Review of Medication Regimen & Side Effects (2) and Review of New Medication or Change in Dosage (2)   Gena Fray 02/18/2016, 11:08  AM

## 2016-02-18 NOTE — Plan of Care (Signed)
Problem: Diagnosis: Increased Risk For Suicide Attempt Goal: LTG-Patient Will Show Positive Response to Medication LTG (by discharge) : Patient will show positive response to medication and will participate in the development of the discharge plan.  Outcome: Progressing Patient complient with medications and showing less depression since medications started. [place] RN

## 2016-02-18 NOTE — [facility] Group Notes (Signed)
[facility] Group Notes:  (Nursing/MHT/Case Management/Adjunct)  Date:  02/18/2016  Time:  4:52 PM  Type of Therapy:  Psychoeducational Skills  Participation Level:  Did Not Attend  Twanna Hymanda C Deniel Mcquiston 02/18/2016, 4:52 PM

## 2016-02-18 NOTE — Plan of Care (Signed)
Problem: Diagnosis: Increased Risk For Suicide Attempt Goal: STG-Patient Will Report Suicidal Feelings to Staff Outcome: Progressing Denies SI and contracts for safety.

## 2016-02-18 NOTE — Progress Notes (Signed)
Patient described his mood as "fair." He stated that he was depressed but denied SI. He contracted for safety. He denied AH/VH. He remained medication compliant and attended some groups. He however did state to Clinical research associatewriter that he did not like groups because he couldn't write. He said that he is embarrassed that he cannot write. Patient encouraged to attend even if it was just to listen. He said he would try.

## 2016-02-18 NOTE — Progress Notes (Signed)
D: Patient is alert and oriented on the unit this shift. Patient attended and actively participated in groups today. Patient denies suicidal ideation, homicidal ideation, auditory or visual hallucinations at the present time.  A: Scheduled medications are administered to patient as per MD orders. Emotional support and encouragement are provided. Patient is maintained on q.15 minute safety checks. Patient is informed to notify staff with questions or concerns. R: No adverse medication reactions are noted. Patient is cooperative with medication administration and treatment plan today. Patient is receptive, some depression and cooperative on the unit at this time. Patient interacts well with others on the unit this shift. Patient contracts for safety at this time. Patient remains safe at this time.

## 2016-02-18 NOTE — [facility] Group Notes (Signed)
[facility] LCSW Group Therapy  02/18/2016 9:17 AM  Type of Therapy:  Group Therapy  Participation Level:  Did Not Attend  Modes of Intervention:  Discussion, Education, Socialization and Support  Summary of Progress/Problems: Balance in life: Patients will discuss the concept of balance and how it looks and feels to be unbalanced. Pt will identify areas in their life that is unbalanced and ways to become more balanced.    Mirna Sutcliffe L Tametha Banning MSW, LCSWA  02/18/2016, 9:17 AM  

## 2016-02-19 DIAGNOSIS — F332 Major depressive disorder, recurrent severe without psychotic features: Secondary | ICD-10-CM | POA: Diagnosis not present

## 2016-02-19 LAB — CALCITRIOL (1,25 DI-OH VIT D): VIT D 1 25 DIHYDROXY: 43.5 pg/mL (ref 19.9–79.3)

## 2016-02-19 NOTE — Plan of Care (Signed)
Problem: Alteration in mood Goal: STG-Patient is able to discuss feelings and issues (Patient is able to discuss feelings and issues leading to depression)  Outcome: Progressing Patient able to express feelings when asked questions

## 2016-02-19 NOTE — Plan of Care (Signed)
Problem: Diagnosis: Increased Risk For Suicide Attempt Goal: LTG-Patient Will Show Positive Response to Medication LTG (by discharge) : Patient will show positive response to medication and will participate in the development of the discharge plan.  Outcome: Progressing Patient progressing by verbalizing medication is effective as he has no SI/HI/AVH

## 2016-02-19 NOTE — [facility] Group Notes (Signed)
[facility] Group Notes:  (Nursing/MHT/Case Management/Adjunct)  Date:  02/19/2016  Time:  2:26 AM  Type of Therapy:  Group Therapy  Participation Level:  Did Not Attend    Summary of Progress/Problems:  Veva Holesshley Imani Gisell Buehrle 02/19/2016, 2:26 AM

## 2016-02-19 NOTE — [facility] Suicide Risk Assessment (Signed)
[facility] INPATIENT:  Family/Significant Other Suicide Prevention Education  Suicide Prevention Education:  Patient Refusal for Family/Significant Other Suicide Prevention Education: The patient Mario Knapp has refused to provide written consent for family/significant other to be provided Family/Significant Other Suicide Prevention Education during admission and/or prior to discharge.  Physician notified.  CSW refused SPE from the pt.  Dorothe PeaJonathan F Michalle Rademaker 02/19/2016, 9:37 AM

## 2016-02-19 NOTE — Progress Notes (Signed)
D: Patient is alert and oriented on the unit this shift. Patient attended and actively participated in groups today. Patient denies suicidal ideation, homicidal ideation, auditory or visual hallucinations at the present time.  A: Scheduled medications are administered to patient as per MD orders. Emotional support and encouragement are provided. Patient is maintained on q.15 minute safety checks. Patient is informed to notify staff with questions or concerns. R: No adverse medication reactions are noted. Patient is cooperative with medication administration and treatment plan today. Patient is receptive, calm,sullen at times and cooperative on the unit at this time. Patient interacts well with others on the unit this shift. Patient contracts for safety at this time. Patient remains safe at this time.

## 2016-02-19 NOTE — Plan of Care (Signed)
Problem: Diagnosis: Increased Risk For Suicide Attempt Goal: STG-Patient Will Attend All Groups On The Unit Outcome: Progressing Patient progressing well and interacting well in group.

## 2016-02-19 NOTE — [facility] Group Notes (Addendum)
[facility]   02/19/2016 11:51 AM  Participation Quality:  Active   Mood/Affect:  Appropriate  Depression Rating:  4  Anxiety Rating:  0  Thoughts of Suicide:  No Will you contract for safety?   NA  Current AVH:  NA  Plan for Discharge/Comments: Pt plans to return home and follow up with outpatient. Pt reports he needs a outpatient psychiatrist.   Transportation Means: Wife   Supports: family   Garment/textile technologistCandace L Alizeh Madril MSW, [address]CSWA

## 2016-02-19 NOTE — Plan of Care (Signed)
Problem: Diagnosis: Increased Risk For Suicide Attempt Goal: STG-Patient Will Report Suicidal Feelings to Staff Outcome: Progressing Patient is able to verbalize whether he is having SI

## 2016-02-19 NOTE — Progress Notes (Signed)
[facility]  02/19/2016 11:49 AM Mario Knapp  MRN:  [id number]   Subjective:  Pt admitted for worsening depression. Nursing notes   Today on interview, pt is up and about the unit. He is attending groups and enjoyed attending groups. He feels he is learning a lot. Pt feels medication is helpful. He denies medication side effects including TD, EPS and akathisia. He is hopeful aripiprazole will help to improve mood.   Pt visited with his wife yesterday which went well. He is sleeping and eating well. Appears sullen at times. Denies SI, HI and AVH.   Principal Problem: Severe recurrent major depression without psychotic features (HCC) Diagnosis:   Patient Active Problem List   Diagnosis Date Noted  . Severe recurrent major depression without psychotic features (HCC) [F33.2] 02/16/2016  . Suicidal ideation [R45.851] 02/16/2016  . Benign hypertrophy of prostate [N40.0] 02/16/2016   Total Time spent with patient: 15 minutes   Past Medical History:  Past Medical History  Diagnosis Date  . Depression   . Sleep apnea   . Arthritis     Past Surgical History  Procedure Laterality Date  . Cholecystectomy     Family History: History reviewed. No pertinent family history. Social History:  History  Alcohol Use No     History  Drug Use No    Social History   Social History  . Marital Status: Married    Spouse Name: N/A  . Number of Children: N/A  . Years of Education: N/A   Social History Main Topics  . Smoking status: Former Smoker    Types: Cigarettes  . Smokeless tobacco: Never Used  . Alcohol Use: No  . Drug Use: No  . Sexual Activity: Not Currently   Other Topics Concern  . None   Social History Narrative   Additional History:    Sleep: Fair  Appetite:  Good   Assessment:   Musculoskeletal: Strength & Muscle Tone: within normal limits Gait & Station: normal Patient leans: N/A   Psychiatric Specialty Exam: Physical Exam  ROS  Blood  pressure 114/77, pulse 84, temperature 97.9 F (36.6 C), temperature source Oral, resp. rate 20, height 5\' 8"  (1.727 m), weight 108.863 kg (240 lb).Body mass index is 36.5 kg/(m^2).  General Appearance: Fairly Groomed and wearing hospital scrubs  Eye Contact::  Good  Speech:  Clear and Coherent and Normal Rate  Volume:  Normal  Mood:  Depressed and but reactive  Affect:  Constricted  Thought Process:  Coherent, Goal Directed, Intact, Linear and Logical  Orientation:  Full (Time, Place, and Person)  Thought Content:  Rumination  Suicidal Thoughts:  No  Homicidal Thoughts:  No  Memory:  Immediate;   Good Recent;   Good Remote;   Good  Judgement:  Fair  Insight:  Fair  Psychomotor Activity:  Normal  Concentration:  Good  Recall:  Good  Fund of Knowledge:Good  Language: Good  Cognition: WNL    Akathisia:  No             Current Medications: Current Facility-Administered Medications  Medication Dose Route Frequency Provider Last Rate Last Dose  . acetaminophen (TYLENOL) tablet 650 mg  650 mg Oral Q6H PRN Audery AmelJohn T Clapacs, MD      . alum & mag hydroxide-simeth (MAALOX/MYLANTA) 200-200-20 MG/5ML suspension 30 mL  30 mL Oral Q4H PRN Audery AmelJohn T Clapacs, MD      . ARIPiprazole (ABILIFY) tablet 5 mg  5 mg Oral Daily Johnella Moloneyyan G  Jenne Campus, MD   5 mg at 02/19/16 0850  . clonazePAM (KLONOPIN) tablet 0.5 mg  0.5 mg Oral BID PRN Audery Amel, MD   0.5 mg at 02/16/16 2124  . escitalopram (LEXAPRO) tablet 20 mg  20 mg Oral Daily Gena Fray, MD   20 mg at 02/19/16 0850  . magnesium hydroxide (MILK OF MAGNESIA) suspension 30 mL  30 mL Oral Daily PRN Audery Amel, MD      . mirtazapine (REMERON) tablet 45 mg  45 mg Oral QHS Audery Amel, MD   45 mg at 02/18/16 2155  . multivitamin with minerals tablet 1 tablet  1 tablet Oral Daily Audery Amel, MD   1 tablet at 02/19/16 0850  . tamsulosin (FLOMAX) capsule 0.4 mg  0.4 mg Oral QPC supper Audery Amel, MD   0.4 mg at 02/18/16 1901    Lab  Results:  Results for orders placed or performed during the hospital encounter of 02/16/16 (from the past 48 hour(s))  TSH     Status: None   Collection Time: 02/17/16  9:14 PM  Result Value Ref Range   TSH 2.997 0.350 - 4.500 uIU/mL  Folate     Status: None   Collection Time: 02/17/16  9:14 PM  Result Value Ref Range   Folate 19.5 >5.9 ng/mL  Vitamin B12     Status: None   Collection Time: 02/17/16  9:14 PM  Result Value Ref Range   Vitamin B-12 224 180 - 914 pg/mL    Comment: (NOTE) This assay is not validated for testing neonatal or myeloproliferative syndrome specimens for Vitamin B12 levels. Performed at [facility]   Hemoglobin A1c     Status: None   Collection Time: 02/17/16  9:14 PM  Result Value Ref Range   Hgb A1c MFr Bld 5.4 4.0 - 6.0 %    Physical Findings: AIMS: Facial and Oral Movements Muscles of Facial Expression: None, normal Lips and Perioral Area: None, normal Jaw: None, normal Tongue: None, normal,Extremity Movements Upper (arms, wrists, hands, fingers): None, normal Lower (legs, knees, ankles, toes): None, normal, Trunk Movements Neck, shoulders, hips: None, normal, Overall Severity Severity of abnormal movements (highest score from questions above): None, normal Incapacitation due to abnormal movements: None, normal Patient's awareness of abnormal movements (rate only patient's report): No Awareness, Dental Status Current problems with teeth and/or dentures?: No Does patient usually wear dentures?: No  CIWA:  CIWA-Ar Total: 0  Treatment Plan Summary: Daily contact with patient to assess and evaluate symptoms and progress in treatment and Medication management  1. MDD, recurrent, severe and unspecified anxiety disorder: - Continue mirtazapine  po QHS. If it causes interruption in sleep, will change dosing to Qam.  - Continue escitalopram to  po QD - Continue aripiprazole  po QD.  -  Pt will need psychotherapy and psychiatric appts at discharge.   2. Diet: regular  3. Observation: 15 minute checks  4. Precautions: Suicide  Medical Decision Making:  Established Problem, Stable/Improving (1), Review of Psycho-Social Stressors (1), Review or order clinical lab tests (1), Review of Medication Regimen & Side Effects (2) and Review of New Medication or Change in Dosage (2)   Gena Fray 02/19/2016, 11:49 AM

## 2016-02-19 NOTE — Tx Team (Signed)
Interdisciplinary Treatment Plan Update (Adult)         Date: 02/19/2016   Time Reviewed: 9:30 AM   Progress in Treatment: Improving Attending groups: Yes  Participating in groups: Yes  Taking medication as prescribed: Yes  Tolerating medication: Yes  Family/Significant other contact made: No, CSW still assessing for appropriate contacts Patient understands diagnosis: Yes  Discussing patient identified problems/goals with staff: Yes  Medical problems stabilized or resolved: Yes  Denies suicidal/homicidal ideation: Yes  Issues/concerns per patient self-inventory: Yes  Other:   New problem(s) identified: N/A   Discharge Plan or Barriers: CSW still assessing for appropriate contacts   Reason for Continuation of Hospitalization:   Depression   Anxiety   Medication Stabilization   Comments: N/A   Estimated length of stay: 3-5 days    Pt is a 70 year old male who was admitted for . Marland Kitchen The pt shared he cares for his 64yo mother who is not ill but unable to remain in the home by herself. His sisters are not much help in caring for his mother but they want to be in charge of issues.  On 12/22/03, the pt's 85yo son died in a motor vehicle accident. The pt initially had reunion fantasies of being with his son but denies these fantasies currently. He denies having these fantasies for some time. He shared this time of year is usually difficult for him. Pt also shared "I have always wanted to be dead since I was a child."  He reports episodes of tearfulness and hopelessness. He reports variable worthlessness. He denies symptoms consistent with depression, mania and psychosis at the time of this interview. He denies helplessness and unintentional weight loss.  He is currently taking mirtazapine at home. He is not certain if he is currently taking escitalopram at home. He was taking armodafanil at one time but had to discontinue the medication due to insurance issues. He is not certain  antidepressant therapy is currently helpful. He does not believe he has taken aripiprazole or fluoxetine in the past.  He endorses SI but feels this is improving. He denies a plan to kill himself. He denies HI and AVH.  Pt provided me permission to speak with his wife Mario Knapp at [phone]. Other information:  02/17/2016 4:32 PM:  I contacted the pt's wife who noted the pt is taking escitalopram 88m BID; clonazepam 0.57mBID/prn; mirtazapine 45103mo QHS; daily MVI and tamsulosin 0.4mg39m QHS.  Mrs. BrooLangilleed the pt was doing well until about 1 month ago then declined more over the past week. She shared this is normally a difficult time of year for the patient due to the death of their son. She also noted holidays are difficult for the patient as well.  [place] will benefit from crisis stabilization, medication evaluation, group therapy, and psycho education in addition to case management for discharge planning. Patient and CSW reviewed pt's identified goals and treatment plan. Pt verbalized understanding and agreed to treatment plan.    Review of initial/current patient goals per problem list:  1. Goal(s): Patient will participate in aftercare plan   Met: No  Target date: 3-5 days post admission date   As evidenced by: Patient will participate within aftercare plan AEB aftercare provider and housing plan at discharge being identified.   4/14: CSW still assessing for appropriate contacts    2. Goal (s): Patient will exhibit decreased depressive symptoms and suicidal ideations.   Met:  No  Target date: 3-5 days post admission date   As evidenced by: Patient will utilize self-rating of depression at 3 or below and demonstrate decreased signs of depression or be deemed stable for discharge by MD.   4/14: Goal progressing.    3. Goal(s): Patient will demonstrate decreased signs and symptoms of anxiety.   Met: No  Target date: 3-5 days post admission date    As evidenced by: Patient will utilize self-rating of anxiety at 3 or below and demonstrated decreased signs of anxiety, or be deemed stable for discharge by MD  4/14: Goal progressing.     Attendees:  Patient: Mario Knapp. Rolena Infante Family:  Physician: Dr. Tami Ribas, MD    02/19/2016 9:30 AM  Nursing: Elige Radon, RN     02/19/2016 9:30 AM  Clinical Social Worker: Marylou Flesher, [place]  02/19/2016 9:30 AM  Clinical Social Worker: [place], [place]  02/19/2016 9:30 AM  Psychologist: Consuella Lose   02/19/2016 9:30 AM  Nursing: Floyde Parkins, RN    02/19/2016 9:30 AM  Other:        02/19/2016 9:30 AM   Alphonse Guild. Aleece Loyd, LCSWA, LCAS  02/22/16

## 2016-02-19 NOTE — Progress Notes (Signed)
Pt pleasant and cooperative with care. Denies SI, HI, AVH. Endorses depression, but reports feeling better. Med and group compliant. Encouragement and support offered. Pt receptive and remains safe on unit with q 15 min checks
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
## 2016-02-20 DIAGNOSIS — F332 Major depressive disorder, recurrent severe without psychotic features: Principal | ICD-10-CM

## 2016-02-20 NOTE — Progress Notes (Signed)
Patient ID: Mario Knapp, male   DOB: 05/03/1946, 70 y.o.   MRN: [id number] [facility]  02/20/2016 10:19 AM Mario HummingbirdJames Alvin Knapp  MRN:  [id number]   Subjective:  Pt admitted for worsening depression.   Today on interview, patient is ambulating comfortably on the unit. Reports doing ok, denies suicidal thoughts. Presents with flat affect. He is attending groups and enjoyed attending groups. He feels he is learning a lot. Pt feels medication is helpful. He denies medication side effects including TD, EPS and akathisia.   Per staff, he has been cooperative on the unit.  Principal Problem: Severe recurrent major depression without psychotic features (HCC) Diagnosis:   Patient Active Problem List   Diagnosis Date Noted  . Severe recurrent major depression without psychotic features (HCC) [F33.2] 02/16/2016  . Suicidal ideation [R45.851] 02/16/2016  . Benign hypertrophy of prostate [N40.0] 02/16/2016   Total Time spent with patient: 15 minutes   Past Medical History:  Past Medical History  Diagnosis Date  . Depression   . Sleep apnea   . Arthritis     Past Surgical History  Procedure Laterality Date  . Cholecystectomy     Family History: History reviewed. No pertinent family history. Social History:  History  Alcohol Use No     History  Drug Use No    Social History   Social History  . Marital Status: Married    Spouse Name: N/A  . Number of Children: N/A  . Years of Education: N/A   Social History Main Topics  . Smoking status: Former Smoker    Types: Cigarettes  . Smokeless tobacco: Never Used  . Alcohol Use: No  . Drug Use: No  . Sexual Activity: Not Currently   Other Topics Concern  . None   Social History Narrative   Additional History:    Sleep: Fair  Appetite:  Good   Assessment:   Musculoskeletal: Strength & Muscle Tone: within normal limits Gait & Station: normal Patient leans: N/A   Psychiatric Specialty Exam: Physical Exam   ROS  Blood pressure 132/72, pulse 89, temperature 98 F (36.7 C), temperature source Oral, resp. rate 20, height 5\' 8"  (1.727 m), weight 240 lb (108.863 kg).Body mass index is 36.5 kg/(m^2).  General Appearance: Fairly Groomed and wearing hospital scrubs  Eye Contact::  Good  Speech:  Clear and Coherent and Normal Rate  Volume:  Normal  Mood:  Depressed and but reactive  Affect:  Constricted  Thought Process:  Coherent, Goal Directed, Intact, Linear and Logical  Orientation:  Full (Time, Place, and Person)  Thought Content:  Rumination  Suicidal Thoughts:  No  Homicidal Thoughts:  No  Memory:  Immediate;   Good Recent;   Good Remote;   Good  Judgement:  Fair  Insight:  Fair  Psychomotor Activity:  Normal  Concentration:  Good  Recall:  Good  Fund of Knowledge:Good  Language: Good  Cognition: WNL    Akathisia:  No             Current Medications: Current Facility-Administered Medications  Medication Dose Route Frequency Provider Last Rate Last Dose  . acetaminophen (TYLENOL) tablet 650 mg  650 mg Oral Q6H PRN Audery AmelJohn T Clapacs, MD      . alum & mag hydroxide-simeth (MAALOX/MYLANTA) 200-200-20 MG/5ML suspension 30 mL  30 mL Oral Q4H PRN Audery AmelJohn T Clapacs, MD      . ARIPiprazole (ABILIFY) tablet 5 mg  5 mg Oral Daily Gena Frayyan G McQueen,  MD   5 mg at 02/20/16 1001  . clonazePAM (KLONOPIN) tablet 0.5 mg  0.5 mg Oral BID PRN Audery Amel, MD   0.5 mg at 02/16/16 2124  . escitalopram (LEXAPRO) tablet 20 mg  20 mg Oral Daily Gena Fray, MD   20 mg at 02/20/16 1000  . magnesium hydroxide (MILK OF MAGNESIA) suspension 30 mL  30 mL Oral Daily PRN Audery Amel, MD      . mirtazapine (REMERON) tablet 45 mg  45 mg Oral QHS Audery Amel, MD   45 mg at 02/19/16 2142  . multivitamin with minerals tablet 1 tablet  1 tablet Oral Daily Audery Amel, MD   1 tablet at 02/20/16 1000  . tamsulosin (FLOMAX) capsule 0.4 mg  0.4 mg Oral QPC supper Audery Amel, MD   0.4 mg at 02/19/16 1642     Lab Results:  No results found for this or any previous visit (from the past 48 hour(s)).  Physical Findings: AIMS: Facial and Oral Movements Muscles of Facial Expression: None, normal Lips and Perioral Area: None, normal Jaw: None, normal Tongue: None, normal,Extremity Movements Upper (arms, wrists, hands, fingers): None, normal Lower (legs, knees, ankles, toes): None, normal, Trunk Movements Neck, shoulders, hips: None, normal, Overall Severity Severity of abnormal movements (highest score from questions above): None, normal Incapacitation due to abnormal movements: None, normal Patient's awareness of abnormal movements (rate only patient's report): No Awareness, Dental Status Current problems with teeth and/or dentures?: No Does patient usually wear dentures?: No  CIWA:  CIWA-Ar Total: 0  Treatment Plan Summary: Daily contact with patient to assess and evaluate symptoms and progress in treatment and Medication management  1. MDD, recurrent, severe and unspecified anxiety disorder: - Continue mirtazapine  po QHS. If it causes interruption in sleep, will change dosing to Qam.  - Continue escitalopram to  po QD - Continue aripiprazole  po QD.  - Pt will need psychotherapy and psychiatric appts at discharge.   2. Diet: regular  3. Observation: 15 minute checks  4. Precautions: Suicide  Medical Decision Making:  Established Problem, Stable/Improving (1), Review of Psycho-Social Stressors (1), Review or order clinical lab tests (1), Review of Medication Regimen & Side Effects (2) and Review of New Medication or Change in Dosage (2)   Minor Iden 02/20/2016, 10:19 AM

## 2016-02-20 NOTE — [facility] Group Notes (Signed)
[facility] LCSW Group Therapy  02/20/2016 2:57 PM  Type of Therapy:  Group Therapy  Participation Level:  Active  Participation Quality:  Attentive  Affect:  Appropriate  Cognitive:  Alert  Insight:  Limited  Engagement in Therapy:  Limited  Modes of Intervention:  Discussion, Education, Socialization and Support  Summary of Progress/Problems: Boundaries: Patients defined boundaries and discussed the importance of them. Patients identified their own boundaries and how they feel when they are crossed. Patients discussed ways to create and/ or improve their personal boundaries. Pt attended group and stayed the entire time. Pt sat quietly and listened to other group members share.   Nghia Mcentee L Ivelis Norgard MSW, LCSWA  02/20/2016, 2:57 PM  

## 2016-02-20 NOTE — Progress Notes (Signed)
Patient with appropriate affect, cooperative behavior with meals, meds and plan of care. No SI/HI at this time. Good adls, attends am therapy group outside and social with peers. Verbalizes needs appropriately with peers. Safety maintained.

## 2016-02-20 NOTE — Progress Notes (Signed)
Patient with sad affect, cooperative behavior with meals, meds and plan of care. No SI/HI a this time Quiet with peers, verbalizes needs appropriately to staff. Remains on falls protocol, ambulates with walker and slow and steady gait. Safety maintained.

## 2016-02-20 NOTE — Plan of Care (Signed)
Problem: Alteration in mood Goal: LTG-Patient reports reduction in suicidal thoughts (Patient reports reduction in suicidal thoughts and is able to verbalize a safety plan for whenever patient is feeling suicidal)  Outcome: Progressing No SI/HI at this time.      

## 2016-02-21 DIAGNOSIS — F332 Major depressive disorder, recurrent severe without psychotic features: Secondary | ICD-10-CM | POA: Diagnosis not present

## 2016-02-21 NOTE — Progress Notes (Signed)
Patient calm, pleasant and interacting with peers and staff. Lightened up a lot when he talked about his grandson and Easter. Joked that he was promised to be discharged before his birthday. No concerns verbalized by patient. Attended groups.

## 2016-02-21 NOTE — [facility] Group Notes (Signed)
[facility] LCSW Group Therapy  02/21/2016 11:04 AM  Type of Therapy:  Group Therapy  Participation Level:  Active  Participation Quality:  Attentive  Affect:  Appropriate  Cognitive:  Alert  Insight:  Limited  Engagement in Therapy:  Limited  Modes of Intervention:  Discussion, Education, Socialization and Support  Summary of Progress/Problems: Mindfulness: Patient discussed mindfulness and relaxing techniques and why they are beneficial. Pt discussed ways to incorporate mindfulness in their lives. Pt practiced a mindfulness techique and discussed how it made them feel. Pt attended group and stayed the entire time. Pt participated in activity well.   Demetrias Goodbar L Dian Laprade MSW, LCSWA  02/21/2016, 11:04 AM   

## 2016-02-21 NOTE — Progress Notes (Signed)
Patient ID: Mario Knapp, male   DOB: 05/24/1946, 70 y.o.   MRN: [id number]  [facility]  02/21/2016 11:06 AM Mario Knapp  MRN:  [id number]   Subjective:  Pt admitted for worsening depression.   He shouldn't seen in his room this morning. He is lying in bed and states that he is not sleeping. States he slept quite well last night and the medications he is on are preventing him from taking daytime naps. Overall reports that his mood has significantly improved since being in the hospital. Denies any side effects from the medication. He is attending groups and enjoyed attending groups. Denies medication side effects including TD, EPS and akathisia.   Per staff, he has been cooperative on the unit.  Principal Problem: Severe recurrent major depression without psychotic features (HCC) Diagnosis:   Patient Active Problem List   Diagnosis Date Noted  . Severe recurrent major depression without psychotic features (HCC) [F33.2] 02/16/2016  . Suicidal ideation [R45.851] 02/16/2016  . Benign hypertrophy of prostate [N40.0] 02/16/2016   Total Time spent with patient: 15 minutes   Past Medical History:  Past Medical History  Diagnosis Date  . Depression   . Sleep apnea   . Arthritis     Past Surgical History  Procedure Laterality Date  . Cholecystectomy     Family History: History reviewed. No pertinent family history. Social History:  History  Alcohol Use No     History  Drug Use No    Social History   Social History  . Marital Status: Married    Spouse Name: N/A  . Number of Children: N/A  . Years of Education: N/A   Social History Main Topics  . Smoking status: Former Smoker    Types: Cigarettes  . Smokeless tobacco: Never Used  . Alcohol Use: No  . Drug Use: No  . Sexual Activity: Not Currently   Other Topics Concern  . None   Social History Narrative   Additional History:    Sleep: Fair  Appetite:  Good   Assessment:    Musculoskeletal: Strength & Muscle Tone: within normal limits Gait & Station: normal Patient leans: N/A   Psychiatric Specialty Exam: Physical Exam  ROS  Blood pressure 137/75, pulse 91, temperature 98.2 F (36.8 C), temperature source Oral, resp. rate 18, height 5\' 8"  (1.727 m), weight 240 lb (108.863 kg).Body mass index is 36.5 kg/(m^2).  General Appearance: Fairly Groomed and wearing hospital scrubs  Eye Contact::  Good  Speech:  Clear and Coherent and Normal Rate  Volume:  Normal  Mood:  Improved   Affect: Pleasant today   Thought Process:  Coherent, Goal Directed, Intact, Linear and Logical  Orientation:  Full (Time, Place, and Person)  Thought Content:  Normal   Suicidal Thoughts:  No  Homicidal Thoughts:  No  Memory:  Immediate;   Good Recent;   Good Remote;   Good  Judgement:  Fair  Insight:  Fair  Psychomotor Activity:  Normal  Concentration:  Good  Recall:  Good  Fund of Knowledge:Good  Language: Good  Cognition: WNL    Akathisia:  No             Current Medications: Current Facility-Administered Medications  Medication Dose Route Frequency Provider Last Rate Last Dose  . acetaminophen (TYLENOL) tablet 650 mg  650 mg Oral Q6H PRN Audery AmelJohn T Clapacs, MD   650 mg at 02/20/16 2212  . alum & mag hydroxide-simeth (MAALOX/MYLANTA) 200-200-20 MG/5ML suspension  30 mL  30 mL Oral Q4H PRN Audery Amel, MD      . ARIPiprazole (ABILIFY) tablet 5 mg  5 mg Oral Daily Gena Fray, MD   5 mg at 02/21/16 1610  . clonazePAM (KLONOPIN) tablet 0.5 mg  0.5 mg Oral BID PRN Audery Amel, MD   0.5 mg at 02/16/16 2124  . escitalopram (LEXAPRO) tablet 20 mg  20 mg Oral Daily Gena Fray, MD   20 mg at 02/21/16 0829  . magnesium hydroxide (MILK OF MAGNESIA) suspension 30 mL  30 mL Oral Daily PRN Audery Amel, MD      . mirtazapine (REMERON) tablet 45 mg  45 mg Oral QHS Audery Amel, MD   45 mg at 02/20/16 2213  . multivitamin with minerals tablet 1 tablet  1 tablet  Oral Daily Audery Amel, MD   1 tablet at 02/21/16 [id number]  . tamsulosin (FLOMAX) capsule 0.4 mg  0.4 mg Oral QPC supper Audery Amel, MD   0.4 mg at 02/20/16 1800    Lab Results:  No results found for this or any previous visit (from the past 48 hour(s)).  Physical Findings: AIMS: Facial and Oral Movements Muscles of Facial Expression: None, normal Lips and Perioral Area: None, normal Jaw: None, normal Tongue: None, normal,Extremity Movements Upper (arms, wrists, hands, fingers): None, normal Lower (legs, knees, ankles, toes): None, normal, Trunk Movements Neck, shoulders, hips: None, normal, Overall Severity Severity of abnormal movements (highest score from questions above): None, normal Incapacitation due to abnormal movements: None, normal Patient's awareness of abnormal movements (rate only patient's report): No Awareness, Dental Status Current problems with teeth and/or dentures?: No Does patient usually wear dentures?: No  CIWA:  CIWA-Ar Total: 0  Treatment Plan Summary: Daily contact with patient to assess and evaluate symptoms and progress in treatment and Medication management  1. MDD, recurrent, severe and unspecified anxiety disorder: - Continue mirtazapine  po QHS. If it causes interruption in sleep, will change dosing to Qam.  - Continue escitalopram to  po QD - Continue aripiprazole  po QD.  - Pt will need psychotherapy and psychiatric appts at discharge.   2. Diet: regular  3. Observation: 15 minute checks  4. Precautions: Suicide  Medical Decision Making:  Established Problem, Stable/Improving (1), Review of Psycho-Social Stressors (1), Review or order clinical lab tests (1), Review of Medication Regimen & Side Effects (2) and Review of New Medication or Change in Dosage (2)   Dareon Nunziato 02/21/2016, 11:06 AM

## 2016-02-21 NOTE — Progress Notes (Signed)
D: Pt denies SI. Remained in room majority of shift as Pt report of headache. Cooperative and pleasant during interaction.  A: Encouragement and support provided. Q15 minute checks maintained for safety. Medications given as prescribed. PRN tylenol for headache, effective.  R: Remains safe on unit. Voices no additional concerns at this time.

## 2016-02-21 NOTE — Plan of Care (Signed)
Problem: Alteration in mood Goal: STG-Patient is able to discuss feelings and issues (Patient is able to discuss feelings and issues leading to depression)  Outcome: Progressing Patient quite interactive with peers and staff. Able to adequately verbalize feelings/concerns to nursing staff.

## 2016-02-22 DIAGNOSIS — F332 Major depressive disorder, recurrent severe without psychotic features: Secondary | ICD-10-CM | POA: Diagnosis not present

## 2016-02-22 MED ORDER — MIRTAZAPINE 15 MG PO TABS
30.0000 mg | ORAL_TABLET | Freq: Every day | ORAL | Status: DC
  Administered 2016-02-22: 30 mg via ORAL
  Filled 2016-02-22: qty 2

## 2016-02-22 MED ORDER — CYANOCOBALAMIN 1000 MCG/ML IJ SOLN
1000.0000 ug | Freq: Every day | INTRAMUSCULAR | Status: DC
  Administered 2016-02-22 – 2016-02-23 (×2): 1000 ug via INTRAMUSCULAR
  Filled 2016-02-22 (×3): qty 1

## 2016-02-22 MED ORDER — CLONAZEPAM 0.5 MG PO TABS
0.5000 mg | ORAL_TABLET | Freq: Two times a day (BID) | ORAL | Status: DC
  Administered 2016-02-22 – 2016-02-23 (×2): 0.5 mg via ORAL
  Filled 2016-02-22 (×2): qty 1

## 2016-02-22 NOTE — Progress Notes (Signed)
Patient is pleasant & cooperative.Stated that his depression is better now.Denies suicidal ideations.Appropriate with staff & peers.Attended groups.Visible in the milieu.Compliant with medications.Appetite & energy level good.

## 2016-02-22 NOTE — [facility] Group Notes (Signed)
[facility] Group Notes:  (Nursing/MHT/Case Management/Adjunct)  Date:  02/22/2016  Time:  9:17 PM  Type of Therapy:  Group Therapy  Participation Level:  Active  Participation Quality:  Appropriate  Affect:  Appropriate  Cognitive:  Appropriate  Insight:  Appropriate  Engagement in Group:  Engaged  Modes of Intervention:  Discussion  Summary of Progress/Problems:  Burt EkJanice Marie Amias Hutchinson 02/22/2016, 9:17 PM

## 2016-02-22 NOTE — Progress Notes (Signed)
D: Pt denies SI. Bright and pleasant during interaction. Visible in milieu. Denies pain. States " Today was a good day. I am feeling better".  Med compliant.  A: Encouragement and support provided. Medications given as prescribed. Q15 minute checks maintained for safety. R: Remains safe on unit. Voices no additional concerns at this time.

## 2016-02-22 NOTE — Progress Notes (Signed)
Patient ID: Mario Knapp, male   DOB: 01-08-1946, 70 y.o.   MRN: [id number]  [facility] MD Progress Note  02/22/2016 3:40 PM Mario Knapp  MRN:  [id number]   Subjective:  Pt admitted for worsening depression.   Patient reports doing better since he was restarted on Abilify. His major concern denies that he is not sleeping well in the evening patient tells me he has a long history of depression. He was treated by one of the local psychiatrists onto the psychiatrist retired. Since then the patient has been without psychiatric care. He has attempted to follow up and find a new psychiatrist without success.  Patient also reported he is not receiving any therapy.    I spoke with the patient's wife reports that the patient seems to be better since the Abilify was added. His prior psychiatrist had used individual in order to augment his antidepressants but the patient never took it because of cost.    I reviewed test results patient has vitamin B12 low levels which will need to be replaced.   Per nursing: D: Pt denies SI. Bright and pleasant during interaction. Visible in milieu. Denies pain. States " Today was a good day. I am feeling better". Med compliant.  A: Encouragement and support provided. Medications given as prescribed. Q15 minute checks maintained for safety. R: Remains safe on unit. Voices no additional concerns at this time.   Principal Problem: Severe recurrent major depression without psychotic features (HCC) Diagnosis:   Patient Active Problem List   Diagnosis Date Noted  . Severe recurrent major depression without psychotic features (HCC) [F33.2] 02/16/2016  . Benign hypertrophy of prostate [N40.0] 02/16/2016   Total Time spent with patient: 30 minutes   Past Medical History:  Past Medical History  Diagnosis Date  . Depression   . Sleep apnea   . Arthritis     Past Surgical History  Procedure Laterality Date  . Cholecystectomy     Family History: History  reviewed. No pertinent family history. Social History:  History  Alcohol Use No     History  Drug Use No    Social History   Social History  . Marital Status: Married    Spouse Name: N/A  . Number of Children: N/A  . Years of Education: N/A   Social History Main Topics  . Smoking status: Former Smoker    Types: Cigarettes  . Smokeless tobacco: Never Used  . Alcohol Use: No  . Drug Use: No  . Sexual Activity: Not Currently   Other Topics Concern  . None   Social History Narrative   Additional History:    Sleep: Fair  Appetite:  Good   Assessment:   Musculoskeletal: Strength & Muscle Tone: within normal limits Gait & Station: normal Patient leans: N/A   Psychiatric Specialty Exam: Physical Exam  Review of Systems  Constitutional: Negative.   HENT: Negative.   Eyes: Negative.   Respiratory: Negative.   Cardiovascular: Negative.   Gastrointestinal: Negative.   Genitourinary: Negative.   Musculoskeletal: Negative.   Skin: Negative.   Neurological: Negative.   Endo/Heme/Allergies: Negative.   Psychiatric/Behavioral: Positive for depression. Negative for suicidal ideas. The patient has insomnia.     Blood pressure 150/82, pulse 74, temperature 97.8 F (36.6 C), temperature source Oral, resp. rate 18, height  (1.727 m), weight 108.863 kg (240 lb).Body mass index is 36.5 kg/(m^2).  General Appearance: Fairly Groomed and wearing hospital scrubs  Eye Contact::  Good  Speech:  Clear and Coherent and Normal Rate  Volume:  Normal  Mood:  Improved   Affect: Pleasant today   Thought Process:  Coherent, Goal Directed, Intact, Linear and Logical  Orientation:  Full (Time, Place, and Person)  Thought Content:  Normal   Suicidal Thoughts:  No  Homicidal Thoughts:  No  Memory:  Immediate;   Good Recent;   Good Remote;   Good  Judgement:  Fair  Insight:  Fair  Psychomotor Activity:  Normal  Concentration:  Good  Recall:  Good  Fund of Knowledge:Good   Language: Good  Cognition: WNL    Akathisia:  No             Current Medications: Current Facility-Administered Medications  Medication Dose Route Frequency Provider Last Rate Last Dose  . acetaminophen (TYLENOL) tablet 650 mg  650 mg Oral Q6H PRN Audery AmelJohn T Clapacs, MD   650 mg at 02/20/16 2212  . alum & mag hydroxide-simeth (MAALOX/MYLANTA) 200-200-20 MG/5ML suspension 30 mL  30 mL Oral Q4H PRN Audery AmelJohn T Clapacs, MD      . ARIPiprazole (ABILIFY) tablet 5 mg  5 mg Oral Daily Gena Frayyan G McQueen, MD   5 mg at 02/22/16 1020  . clonazePAM (KLONOPIN) tablet 0.5 mg  0.5 mg Oral BID Jimmy FootmanAndrea Hernandez-Gonzalez, MD      . cyanocobalamin ((VITAMIN B-12)) injection 1,000 mcg  1,000 mcg Intramuscular Daily Jimmy FootmanAndrea Hernandez-Gonzalez, MD      . escitalopram (LEXAPRO) tablet 20 mg  20 mg Oral Daily Gena Frayyan G McQueen, MD   20 mg at 02/22/16 1020  . magnesium hydroxide (MILK OF MAGNESIA) suspension 30 mL  30 mL Oral Daily PRN Audery AmelJohn T Clapacs, MD      . mirtazapine (REMERON) tablet 30 mg  30 mg Oral QHS Jimmy FootmanAndrea Hernandez-Gonzalez, MD      . tamsulosin (FLOMAX) capsule 0.4 mg  0.4 mg Oral QPC supper Audery AmelJohn T Clapacs, MD   0.4 mg at 02/21/16 1742    Lab Results:  No results found for this or any previous visit (from the past 48 hour(s)).  Physical Findings: AIMS: Facial and Oral Movements Muscles of Facial Expression: None, normal Lips and Perioral Area: None, normal Jaw: None, normal Tongue: None, normal,Extremity Movements Upper (arms, wrists, hands, fingers): None, normal Lower (legs, knees, ankles, toes): None, normal, Trunk Movements Neck, shoulders, hips: None, normal, Overall Severity Severity of abnormal movements (highest score from questions above): None, normal Incapacitation due to abnormal movements: None, normal Patient's awareness of abnormal movements (rate only patient's report): No Awareness, Dental Status Current problems with teeth and/or dentures?: No Does patient usually wear dentures?: No   CIWA:  CIWA-Ar Total: 0  Treatment Plan Summary: Daily contact with patient to assess and evaluate symptoms and progress in treatment and Medication management  MDD, recurrent, severe and unspecified anxiety disorder: -Continue Lexapro 20 mg daily  -Continue mirtazapine however I will decrease the dose from 45 mg to 30 mg at bedtime as patient is not sleeping well. Lower dose of Remeron are more sedating than higher doses.-patient will be continued on Abilify 5 mg by mouth daily in order to augment his antidepressant  Vitamin B12 deficiency patient will be started on vitamin B12 injections  Anxiety the patient will be continued on clonazepam however I will change it from when necessary to schedule as he reports that he is having difficulties falling asleep  BPH continue Flomax daily  Precautions every 15 minute checks  Diet regular  Hospitalization status continue with  voluntary hospitalization  Disposition the patient will be discharged home once stable  Discharge follow-up: Social worker plans to refer the patient to our outpatient clinic. We'll also plan to schedule the patient with a therapist  Potential discharge in the next 24-48 hours.   Medical Decision Making:  Established Problem, Stable/Improving (1), Review of Psycho-Social Stressors (1), Review or order clinical lab tests (1), Review of Medication Regimen & Side Effects (2) and Review of New Medication or Change in Dosage (2)   Jimmy Footman 02/22/2016, 3:40 PM

## 2016-02-22 NOTE — Progress Notes (Signed)
Recreation Therapy Notes  Date: 04.17.17 Time: 1:00 pm Location: Craft Room  Group Topic: Self-expression  Goal Area(s) Addresses:  Patient will be able to identify a color that represents each emotion. Patient will verbalize benefit of using art as a means of self-expression. Patient will verbalize one emotion experienced while participating in activity.  Behavioral Response: Attentive, Interactive  Intervention: The Colors Within Me  Activity: Patients were given a blank face worksheet and instructed to pick a color for each emotion they were experiencing and to show on the face how much of that emotion they are experiencing.  Education: LRT educated patients on other forms of self-expression.  Education Outcome: Acknowledges education/In group clarification offered  Clinical Observations/Feedback: Patient completed activity by picking a color for each emotion he was experiencing and showing on the face worksheet how much of that emotion he was experiencing. Patient contributed to group discussion by stating that how his emotions affect his treatment in the hospital, he thought his emotions were dynamic, some things that can make his emotions change, how he sees his emotions changing once he is about to d/c, and that it was helpful to see his emotions on paper and why.  Jacquelynn CreeGreene,Damonique Brunelle M, LRT/CTRS 02/22/2016 2:28 PM

## 2016-02-22 NOTE — Plan of Care (Signed)
Problem: Alteration in mood Goal: LTG-Patient reports reduction in suicidal thoughts (Patient reports reduction in suicidal thoughts and is able to verbalize a safety plan for whenever patient is feeling suicidal)  Outcome: Progressing Denies suicidal ideations     

## 2016-02-23 DIAGNOSIS — G4733 Obstructive sleep apnea (adult) (pediatric): Secondary | ICD-10-CM

## 2016-02-23 DIAGNOSIS — F332 Major depressive disorder, recurrent severe without psychotic features: Secondary | ICD-10-CM | POA: Diagnosis not present

## 2016-02-23 MED ORDER — MIRTAZAPINE 30 MG PO TABS: 30.0000 mg | ORAL_TABLET | Freq: Every day | ORAL | Status: DC

## 2016-02-23 MED ORDER — ARIPIPRAZOLE 5 MG PO TABS: 5.0000 mg | ORAL_TABLET | Freq: Every day | ORAL | Status: DC

## 2016-02-23 MED ORDER — CYANOCOBALAMIN 1000 MCG/ML IJ SOLN: 1000.0000 ug | Freq: Every day | INTRAMUSCULAR | Status: DC

## 2016-02-23 MED ORDER — CLONAZEPAM 0.5 MG PO TABS: 0.5000 mg | ORAL_TABLET | Freq: Two times a day (BID) | ORAL | Status: DC

## 2016-02-23 MED ORDER — ESCITALOPRAM OXALATE 20 MG PO TABS: 20.0000 mg | ORAL_TABLET | Freq: Every day | ORAL | Status: DC

## 2016-02-23 NOTE — [facility] Suicide Risk Assessment (Signed)
[facility]   Principal Problem: Severe recurrent major depression without psychotic features [facility](HCC) Discharge Diagnoses:  Patient Active Problem List   Diagnosis Date Noted  . Severe recurrent major depression without psychotic features (HCC) [F33.2] 02/16/2016  . Benign hypertrophy of prostate [N40.0] 02/16/2016       Psychiatric Specialty Exam: ROS                                                         Mental Status Per Nursing Assessment::   On Admission:     Demographic Factors:  Male and Caucasian  Loss Factors: NA  Historical Factors: NA  Risk Reduction Factors:   Sense of responsibility to family, Living with another person, especially a relative, Positive social support and Positive coping skills or problem solving skills  Continued Clinical Symptoms:  Previous Psychiatric Diagnoses and Treatments  Cognitive Features That Contribute To Risk:  None    Suicide Risk:  Minimal: No identifiable suicidal ideation.  Patients presenting with no risk factors but with morbid ruminations; may be classified as minimal risk based on the severity of the depressive symptoms  Follow-up Information    Follow up with [facility].   Why:  Please arrive on Tuesday April 25th at 1pm too see Coolidge BreezeMary Bowman for a therapy assessment and to see Dr. Garnetta BuddyFaheem for medication managment.  Please arrive 30 minutes early and bring your hospital discharge summary paperwork with you.   Contact information:   [address] [place], [place] [address] Ph: [phone] Fax: [phone]      Schedule an appointment as soon as possible [facility]   Why:  Please arrive for your hospital follow up on April 21st Friday at 10:30am with your primary care physician Dr. Clydie Braunavid Fitzgerald,   Contact information:   [address] [place] [address] Ph: [phone] Fax: [phone]  Attn: Gearldine BienenstockBrandy       Please follow up.   Why:  Eddie DibblesJames Brookes      Hernandez-Gonzalez,  Alvah Lagrow, MD 02/23/2016, 10:23 AM

## 2016-02-23 NOTE — Progress Notes (Signed)
D:Patient aware of discharge this shift . Patient returning home . Patient received all belonging locked up . Patient denies  Suicidal  And homicidal ideations  .  A: Writer instructed on discharge criteria  . and prescriptions  Given  To patient . Aware  Of follow up appointment . Patient received CPap  Machine thatwas brought in  R: Patient left unit with no questions  Or concerns  With wife

## 2016-02-23 NOTE — Progress Notes (Addendum)
  [facility] :  Will you be returning to the same living situation after discharge:  Yes,  pt will be returning home to [place] to live with his wife At discharge, do you have transportation home?: Yes,  pt will be picked up by his wife Do you have the ability to pay for your medications: Yes,  pt will be provided with prescriptions at discharge  Release of information consent forms completed and in the chart;  Patient's signature needed at discharge.  Patient to Follow up at: Follow-up Information    [facility]   Why:  Please arrive on Tuesday April 25th at 1pm too see Mario BreezeMary Knapp for a therapy assessment and to see Dr. Garnetta Knapp for medication managment.  Please arrive 30 minutes early and bring your hospital discharge summary paperwork with you.   Contact information:   [address] [place], [place] [address] Ph: [phone] Fax: [phone]      [facility]   Why:  Please arrive for your hospital follow up on April 21st Friday at 10:30am with your primary care physician Dr. Clydie Braunavid Knapp,   Contact information:   [address] [place] [address] Ph: [phone] Fax: [phone] Attn: Mario BienenstockBrandy       Next level of care provider has access to [facility] Link:no  Safety Planning and Suicide Prevention discussed? No, pt refused SPE from the CSW.,  Have you used any form of tobacco in the last 30 days? (Cigarettes, Smokeless Tobacco, Cigars, and/or Pipes): No  Has patient been referred to the Quitline?: N/A patient is not a smoker  Patient has been referred for addiction treatment: N/A  Mercy RidingJonathan Knapp Mario Knapp 02/23/2016, 11:49 AM
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
## 2016-02-23 NOTE — Tx Team (Signed)
Interdisciplinary Treatment Plan Update (Adult)         Date: 02/23/2016   Time Reviewed: 9:30 AM   Progress in Treatment: Improving Attending groups: Yes  Participating in groups: Yes  Taking medication as prescribed: Yes  Tolerating medication: Yes  Family/Significant other contact made: No, CSW attempted to contact the pt's wife Patient understands diagnosis: Yes  Discussing patient identified problems/goals with staff: Yes  Medical problems stabilized or resolved: Yes  Denies suicidal/homicidal ideation: Yes  Issues/concerns per patient self-inventory: Yes  Other:   New problem(s) identified: N/A   Discharge Plan or Barriers: Pt will discharge home to [place] to live with his wife and follow up with [facility] for medication management and therapy and with [facility] for his primary care hospital follow up  Reason for Continuation of Hospitalization:   Depression   Anxiety   Medication Stabilization   Comments: N/A   Estimated date of discharge: 02/23/16    Pt is a 70 year old male who was admitted for . Marland Kitchen The pt shared he cares for his 38yo mother who is not ill but unable to remain in the home by herself. His sisters are not much help in caring for his mother but they want to be in charge of issues.  On 01/08/2004, the pt's 59yo son died in a motor vehicle accident. The pt initially had reunion fantasies of being with his son but denies these fantasies currently. He denies having these fantasies for some time. He shared this time of year is usually difficult for him. Pt also shared "I have always wanted to be dead since I was a child."  He reports episodes of tearfulness and hopelessness. He reports variable worthlessness. He denies symptoms consistent with depression, mania and psychosis at the time of this interview. He denies helplessness and unintentional weight loss.  He is currently taking mirtazapine at home. He is not certain if he is  currently taking escitalopram at home. He was taking armodafanil at one time but had to discontinue the medication due to insurance issues. He is not certain antidepressant therapy is currently helpful. He does not believe he has taken aripiprazole or fluoxetine in the past.  He endorses SI but feels this is improving. He denies a plan to kill himself. He denies HI and AVH.  Pt provided me permission to speak with his wife Clenton Esper at [phone]. Other information:  02/17/2016 4:32 PM:  I contacted the pt's wife who noted the pt is taking escitalopram 18m BID; clonazepam 0.580mBID/prn; mirtazapine 4558mo QHS; daily MVI and tamsulosin 0.4mg51m QHS.  Mrs. BrooPasseroed the pt was doing well until about 1 month ago then declined more over the past week. She shared this is normally a difficult time of year for the patient due to the death of their son. She also noted holidays are difficult for the patient as well.  Patient [place] in [place] will benefit from crisis stabilization, medication evaluation, group therapy, and psycho education in addition to case management for discharge planning. Patient and CSW reviewed pt's identified goals and treatment plan. Pt verbalized understanding and agreed to treatment plan.   [place] and will follow up with [facility] medication management and therapy and [facility] his primary care medical treatment.  Review of initial/current patient goals per problem list:  1. Goal(s): Patient will participate in aftercare plan  Met: Yes  Target date: 3-5 days post admission date   As evidenced by: Patient will participate within aftercare plan AEB aftercare provider and housing plan at discharge being identified.   4/14: CSW still assessing for appropriate contacts  4/18: Pt will discharge home to [place] to live with his wife and follow up with [facility] for medication management and therapy and with [facility] for his primary care hospital follow up   2. Goal (s): Patient will exhibit decreased depressive symptoms and suicidal ideations.   Met:  N.adequ  Target date: 3-5 days post admission date   As evidenced by: Patient will utilize self-rating of depression at 3 or below and demonstrate decreased signs of depression or be deemed stable for discharge by MD.   4/14: Goal progressing.  4/18: Adequate for discharge per MD.    3. Goal(s): Patient will demonstrate decreased signs and symptoms of anxiety.   Met: Adequate for discharge per MD.  Target date: 3-5 days post admission date   As evidenced by: Patient will utilize self-rating of anxiety at 3 or below and demonstrated decreased signs of anxiety, or be deemed stable for discharge by MD  4/14: Goal progressing.  4/18: Adequate for discharge per MD.  Pt reports baseline symptoms of anxiety     Attendees:  Patient: Family:  Physician: Dr. Jerilee Hoh, MD    02/23/2016 9:30 AM  Nursing: Polly Cobia, RN     02/23/2016 9:30 AM  Clinical Social Worker: Marylou Flesher, [facility]  02/23/2016 9:30 AM  Clinical Social Worker:  LCSWA    02/23/2016 9:30 AM  Nursing:      02/23/2016 9:30 AM  Nursing: Geanie Berlin, RN    02/23/2016 9:30 AM  Other:        02/23/2016 9:30 AM   Alphonse Guild. Rexton Greulich, LCSWA, LCAS  02/22/16

## 2016-02-23 NOTE — Progress Notes (Signed)
D: Patient denies SI/HI/AVH.  Patient affect and mood are depressed.  Patient did attend evening group. Patient visible on the milieu. No distress noted. A: Support and encouragement offered. Scheduled medications given to pt. Q 15 min checks continued for patient safety. R: Patient receptive. Patient remains safe on the unit.   

## 2016-02-26 DIAGNOSIS — F332 Major depressive disorder, recurrent severe without psychotic features: Secondary | ICD-10-CM | POA: Diagnosis not present

## 2016-02-26 DIAGNOSIS — E538 Deficiency of other specified B group vitamins: Secondary | ICD-10-CM | POA: Diagnosis not present

## 2016-03-01 ENCOUNTER — Ambulatory Visit (INDEPENDENT_AMBULATORY_CARE_PROVIDER_SITE_OTHER): Payer: PPO | Admitting: Licensed Clinical Social Worker

## 2016-03-01 ENCOUNTER — Encounter: Payer: Self-pay | Admitting: Licensed Clinical Social Worker

## 2016-03-01 DIAGNOSIS — F4010 Social phobia, unspecified: Secondary | ICD-10-CM | POA: Diagnosis not present

## 2016-03-01 DIAGNOSIS — F332 Major depressive disorder, recurrent severe without psychotic features: Secondary | ICD-10-CM

## 2016-03-01 DIAGNOSIS — F339 Major depressive disorder, recurrent, unspecified: Secondary | ICD-10-CM | POA: Insufficient documentation
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
## 2016-03-17 ENCOUNTER — Ambulatory Visit: Payer: PPO | Admitting: Psychiatry

## 2016-03-21 ENCOUNTER — Ambulatory Visit: Payer: PPO | Admitting: Psychiatry

## 2016-03-23 ENCOUNTER — Other Ambulatory Visit: Payer: Self-pay | Admitting: Psychiatry

## 2016-03-23 ENCOUNTER — Other Ambulatory Visit: Payer: Self-pay | Admitting: *Deleted

## 2016-03-23 DIAGNOSIS — E669 Obesity, unspecified: Secondary | ICD-10-CM | POA: Diagnosis not present

## 2016-03-23 DIAGNOSIS — G4733 Obstructive sleep apnea (adult) (pediatric): Secondary | ICD-10-CM | POA: Diagnosis not present

## 2016-03-23 MED ORDER — ARIPIPRAZOLE 5 MG PO TABS: 5.0000 mg | ORAL_TABLET | Freq: Every day | ORAL | Status: DC

## 2016-03-23 NOTE — Patient Outreach (Signed)
[facility]) Care Management  03/23/2016  Mario Knapp 05/06/1946 [id number]  Subjective: Telephone call to patient's home number, no answer, left HIPAA compliant voice mail message, and requested call back.  Objective: Per chart review: Patient hospitalized  02/16/16 - 02/23/16 for Sucidial Ideations and worsening depression.    Patient also has a history of OSA, BPH, and depression.   Assessment: Received [facility] referral on 03/21/16.   Referral source: Mario Knapp.   Referral reason: Major depressive disorder recurrent severe without psych features, length of stay is 7, no prior history.   Disease and symptom management.  Patient refused LCSW follow up due to history with psychologist and upcoming appointment with psychiatrist on 03/22/16.    Services [facility].    Plan: RNCM will call patient for 2nd telephone outreach attempt, within 10 business days if no return call from patient.   Mario Befort H. Gardiner Barefootooper RN, BSN, CCM [facility] Telephonic CM Phone: [phone] Fax: [phone]

## 2016-03-28 DIAGNOSIS — G4733 Obstructive sleep apnea (adult) (pediatric): Secondary | ICD-10-CM | POA: Diagnosis not present

## 2016-03-28 DIAGNOSIS — E669 Obesity, unspecified: Secondary | ICD-10-CM | POA: Diagnosis not present

## 2016-03-29 ENCOUNTER — Encounter: Payer: Self-pay | Admitting: Psychiatry

## 2016-03-29 ENCOUNTER — Ambulatory Visit (INDEPENDENT_AMBULATORY_CARE_PROVIDER_SITE_OTHER): Payer: PPO | Admitting: Psychiatry

## 2016-03-29 VITALS — BP 122/64 | HR 100 | Temp 97.4°F | Ht 68.0 in | Wt 245.0 lb

## 2016-03-29 DIAGNOSIS — F332 Major depressive disorder, recurrent severe without psychotic features: Secondary | ICD-10-CM

## 2016-03-29 DIAGNOSIS — E669 Obesity, unspecified: Secondary | ICD-10-CM | POA: Insufficient documentation

## 2016-03-29 MED ORDER — CLONAZEPAM 0.5 MG PO TABS: 0.5000 mg | ORAL_TABLET | Freq: Every day | ORAL | Status: DC

## 2016-03-29 MED ORDER — ESCITALOPRAM OXALATE 20 MG PO TABS: 20.0000 mg | ORAL_TABLET | Freq: Every day | ORAL | Status: DC

## 2016-03-29 MED ORDER — TRAZODONE HCL 100 MG PO TABS: 100.0000 mg | ORAL_TABLET | Freq: Every day | ORAL | Status: DC

## 2016-03-29 MED ORDER — ARIPIPRAZOLE 10 MG PO TABS: 10.0000 mg | ORAL_TABLET | Freq: Every day | ORAL | Status: DC
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
## 2016-04-05 ENCOUNTER — Encounter: Payer: Self-pay | Admitting: *Deleted

## 2016-04-05 ENCOUNTER — Other Ambulatory Visit: Payer: Self-pay | Admitting: *Deleted

## 2016-04-05 NOTE — Patient Outreach (Addendum)
[facility] Care Management  04/05/2016  Mario Knapp 09/11/1946 [id number]  Subjective: Telephone call to patient's home number, no answer, left HIPAA compliant voice mail message, and requested call back. Telephone call to patient's 2nd home number [phone]), male answering phone, states this is the wrong number.   Objective: Per chart review: Patient hospitalized 02/16/16 - 02/23/16 for Sucidial Ideations and worsening depression. Patient also has a history of OSA, BPH, and depression.   Assessment: Received [facility] referral on 03/21/16. Referral source: Mario Knapp. Referral reason: Major depressive disorder recurrent severe without psych features, length of stay is 7, no prior history. Disease and symptom management. Patient refused LCSW follow up due to history with psychologist and upcoming appointment with psychiatrist on 03/22/16. Services [facility] RN[facility].   Plan: RNCM will send patient unsuccessful outreach letter, pamphlet, and will proceed with case closure if no return call within 10 business days. RNCM will send request to Mario Knapp at [facility] to remove 2nd home number [phone])  from patient's demographic information.     Mario Knapp H. Gardiner Barefootooper RN, BSN, CCM [facility] Telephonic CM Phone: [phone] Fax: [phone]

## 2016-04-12 ENCOUNTER — Telehealth: Payer: Self-pay

## 2016-04-12 DIAGNOSIS — R1310 Dysphagia, unspecified: Secondary | ICD-10-CM | POA: Diagnosis not present

## 2016-04-12 NOTE — Telephone Encounter (Signed)
pt called states the sleeping medication is not working, still not able to sleep

## 2016-04-13 NOTE — Telephone Encounter (Signed)
Advise pt to make a follow up appointment to discuss meds.

## 2016-04-15 ENCOUNTER — Emergency Department: Payer: PPO

## 2016-04-15 ENCOUNTER — Encounter: Payer: Self-pay | Admitting: *Deleted

## 2016-04-15 ENCOUNTER — Emergency Department: Payer: PPO | Admitting: Registered Nurse

## 2016-04-15 ENCOUNTER — Encounter
Admission: EM | Disposition: A | Discharge status: Home / Self Care | Payer: Self-pay | Source: Home / Self Care | Attending: Emergency Medicine

## 2016-04-15 ENCOUNTER — Emergency Department
Admission: EM | Admit: 2016-04-15 | Discharge: 2016-04-15 | Disposition: A | Discharge status: Home / Self Care | Payer: PPO | Attending: Emergency Medicine | Admitting: Emergency Medicine

## 2016-04-15 DIAGNOSIS — T18128A Food in esophagus causing other injury, initial encounter: Secondary | ICD-10-CM | POA: Insufficient documentation

## 2016-04-15 DIAGNOSIS — K222 Esophageal obstruction: Secondary | ICD-10-CM | POA: Diagnosis not present

## 2016-04-15 DIAGNOSIS — X58XXXA Exposure to other specified factors, initial encounter: Secondary | ICD-10-CM | POA: Diagnosis not present

## 2016-04-15 DIAGNOSIS — N400 Enlarged prostate without lower urinary tract symptoms: Secondary | ICD-10-CM | POA: Diagnosis not present

## 2016-04-15 DIAGNOSIS — F329 Major depressive disorder, single episode, unspecified: Secondary | ICD-10-CM | POA: Insufficient documentation

## 2016-04-15 DIAGNOSIS — E669 Obesity, unspecified: Secondary | ICD-10-CM | POA: Diagnosis not present

## 2016-04-15 DIAGNOSIS — Z6834 Body mass index (BMI) 34.0-34.9, adult: Secondary | ICD-10-CM | POA: Insufficient documentation

## 2016-04-15 DIAGNOSIS — G4733 Obstructive sleep apnea (adult) (pediatric): Secondary | ICD-10-CM | POA: Insufficient documentation

## 2016-04-15 DIAGNOSIS — E538 Deficiency of other specified B group vitamins: Secondary | ICD-10-CM | POA: Insufficient documentation

## 2016-04-15 DIAGNOSIS — T18108A Unspecified foreign body in esophagus causing other injury, initial encounter: Secondary | ICD-10-CM | POA: Diagnosis not present

## 2016-04-15 DIAGNOSIS — Z791 Long term (current) use of non-steroidal anti-inflammatories (NSAID): Secondary | ICD-10-CM | POA: Diagnosis not present

## 2016-04-15 DIAGNOSIS — Z87891 Personal history of nicotine dependence: Secondary | ICD-10-CM | POA: Insufficient documentation

## 2016-04-15 DIAGNOSIS — M1990 Unspecified osteoarthritis, unspecified site: Secondary | ICD-10-CM | POA: Diagnosis not present

## 2016-04-15 DIAGNOSIS — Z79899 Other long term (current) drug therapy: Secondary | ICD-10-CM | POA: Insufficient documentation

## 2016-04-15 DIAGNOSIS — R0989 Other specified symptoms and signs involving the circulatory and respiratory systems: Secondary | ICD-10-CM | POA: Diagnosis not present

## 2016-04-15 HISTORY — PX: ESOPHAGOGASTRODUODENOSCOPY: SHX5428

## 2016-04-15 LAB — CBC
HEMATOCRIT: 43.4 % (ref 40.0–52.0)
HEMOGLOBIN: 14.5 g/dL (ref 13.0–18.0)
MCH: 30.1 pg (ref 26.0–34.0)
MCHC: 33.5 g/dL (ref 32.0–36.0)
MCV: 90 fL (ref 80.0–100.0)
Platelets: 250 10*3/uL (ref 150–440)
RBC: 4.83 MIL/uL (ref 4.40–5.90)
RDW: 14.3 % (ref 11.5–14.5)
WBC: 9.9 10*3/uL (ref 3.8–10.6)

## 2016-04-15 LAB — COMPREHENSIVE METABOLIC PANEL
ALBUMIN: 4.3 g/dL (ref 3.5–5.0)
ALT: 27 U/L (ref 17–63)
AST: 29 U/L (ref 15–41)
Alkaline Phosphatase: 52 U/L (ref 38–126)
Anion gap: 10 (ref 5–15)
BILIRUBIN TOTAL: 0.8 mg/dL (ref 0.3–1.2)
BUN: 14 mg/dL (ref 6–20)
CHLORIDE: 105 mmol/L (ref 101–111)
CO2: 22 mmol/L (ref 22–32)
CREATININE: 0.71 mg/dL (ref 0.61–1.24)
Calcium: 8.7 mg/dL — ABNORMAL LOW (ref 8.9–10.3)
GFR calc Af Amer: >60 mL/min (ref >60)
GLUCOSE: 117 mg/dL — AB (ref 65–99)
Potassium: 4.1 mmol/L (ref 3.5–5.1)
Sodium: 137 mmol/L (ref 135–145)
Total Protein: 7.3 g/dL (ref 6.5–8.1)

## 2016-04-15 LAB — PROTIME-INR
INR: 0.92
Prothrombin Time: 12.6 seconds (ref 11.4–15.0)

## 2016-04-15 SURGERY — EGD (ESOPHAGOGASTRODUODENOSCOPY)
Anesthesia: General | Site: Esophagus | Wound class: Clean Contaminated

## 2016-04-15 MED ORDER — HYDROMORPHONE HCL 1 MG/ML IJ SOLN: 0.5000 mg | INTRAMUSCULAR | Status: DC

## 2016-04-15 MED ORDER — LACTATED RINGERS IV SOLN
INTRAVENOUS | Status: DC | PRN
  Administered 2016-04-15: 20:00:00 via INTRAVENOUS

## 2016-04-15 MED ORDER — MORPHINE SULFATE (PF) 4 MG/ML IV SOLN
4.0000 mg | Freq: Once | INTRAVENOUS | Status: AC
  Administered 2016-04-15: 4 mg via INTRAVENOUS
  Filled 2016-04-15: qty 1

## 2016-04-15 MED ORDER — NITROGLYCERIN 0.4 MG SL SUBL
SUBLINGUAL_TABLET | SUBLINGUAL | Status: AC
Start: 2016-04-15 — End: 2016-04-15
  Administered 2016-04-15: 0.4 mg via SUBLINGUAL
  Filled 2016-04-15: qty 1

## 2016-04-15 MED ORDER — ONDANSETRON HCL 4 MG/2ML IJ SOLN: 4.0000 mg | Freq: Once | INTRAMUSCULAR | Status: DC | PRN

## 2016-04-15 MED ORDER — FENTANYL CITRATE (PF) 100 MCG/2ML IJ SOLN: 25.0000 ug | INTRAMUSCULAR | Status: DC | PRN

## 2016-04-15 MED ORDER — ONDANSETRON HCL 4 MG/2ML IJ SOLN
4.0000 mg | Freq: Once | INTRAMUSCULAR | Status: AC
  Administered 2016-04-15: 4 mg via INTRAVENOUS
  Filled 2016-04-15: qty 2

## 2016-04-15 MED ORDER — NITROGLYCERIN 0.4 MG SL SUBL
0.4000 mg | SUBLINGUAL_TABLET | SUBLINGUAL | Status: DC | PRN
  Administered 2016-04-15: 0.4 mg via SUBLINGUAL

## 2016-04-15 NOTE — Op Note (Signed)
[facility] Patient Name: Mario Knapp Procedure Date: 04/15/2016 8:03 PM MRN: [id number] Account #: [id number] Date of Birth: 1946/06/09 Admit Type: Outpatient Age: 70 Room: [facility] ENDO ROOM 4 Gender: Male Note Status: Finalized Procedure:            Upper GI endoscopy Indications:          Foreign body in the esophagus Providers:            Midge Minium, MD Medicines:            General Anesthesia Complications:        No immediate complications. Procedure:            Pre-Anesthesia Assessment:                       - Prior to the procedure, a History and Physical was                        performed, and patient medications and allergies were                        reviewed. The patient's tolerance of previous                        anesthesia was also reviewed. The risks and benefits of                        the procedure and the sedation options and risks were                        discussed with the patient. All questions were                        answered, and informed consent was obtained. Prior                        Anticoagulants: The patient has taken no previous                        anticoagulant or antiplatelet agents. ASA Grade                        Assessment: II - A patient with mild systemic disease.                        After reviewing the risks and benefits, the patient was                        deemed in satisfactory condition to undergo the                        procedure.                       After obtaining informed consent, the endoscope was                        passed under direct vision. Throughout the procedure,                        the patient's blood pressure,  pulse, and oxygen                        saturations were monitored continuously. The Endoscope                        was introduced through the mouth, and advanced to the                        second part of duodenum. The upper GI endoscopy was                       accomplished without difficulty. The patient tolerated                        the procedure well. Findings:      One severe benign-appearing, intrinsic stenosis was found at the       gastroesophageal junction. And was traversed.      Food was found at the gastroesophageal junction. Removal of food was       accomplished.      The stomach was normal.      The examined duodenum was normal. Impression:           - Benign-appearing esophageal stenosis.                       - Food at the gastroesophageal junction. Removal was                        successful.                       - Normal stomach.                       - Normal examined duodenum. Recommendation:       - Repeat upper endoscopy for retreatment. Procedure Code(s):    --- Professional ---                       [phone], Esophagogastroduodenoscopy, flexible, transoral;                        with removal of foreign body(s) Diagnosis Code(s):    --- Professional ---                       T18.108A, Unspecified foreign body in esophagus causing                        other injury, initial encounter                       K22.2, Esophageal obstruction                       U04.540J, Food in esophagus causing other injury,                        initial encounter CPT copyright 2016 American Medical Association. All rights reserved. The codes documented in this report are preliminary and upon coder review may  be revised to meet current compliance requirements. Midge Minium, MD 04/15/2016 8:56:39 PM This report has been signed electronically. Number of  Addenda: 0 Note Initiated On: 04/15/2016 8:03 PM      [facility]

## 2016-04-15 NOTE — ED Notes (Signed)
Pt ambulatory to room 5.  Pt ate roast beef 1 hour ago and states it is hung in my throat.  Pt spitting saliva and unable to drink fluids.  Pt alert and talking in complete sentences.

## 2016-04-15 NOTE — ED Provider Notes (Signed)
[facility] Emergency Department Provider Note  ____________________________________________  Time seen: Approximately 7:11 PM  I have reviewed the triage vital signs and the nursing notes.   HISTORY  Chief Complaint Foreign Body  HPI Mario Knapp is a 70 y.o. male presents for evaluation of inability to swallow. Patient was eating beef stew today when he states a piece got stuck. He is a feeling like something is stuck in the lower part of his chest. He is not having any radiating pain, he has been trying to belch but cannot. He is not able to keep anything down because he cannot swallow it. No trouble with his neck, or feeling of sensation stuck in his throat.  He is supposed to see and have an esophagealdilation in about a month.  Reports a severe stuck sensation in the lower part of these chest and upper stomach.   Past Medical History  Diagnosis Date  . Depression   . Sleep apnea   . Arthritis     Patient Active Problem List   Diagnosis Date Noted  . Adiposity 03/29/2016  . Major depressive disorder, recurrent episode (HCC) 03/01/2016  . Social anxiety disorder 03/01/2016  . B12 deficiency 02/26/2016  . OSA (obstructive sleep apnea) 02/23/2016  . Severe recurrent major depression without psychotic features (HCC) 02/16/2016  . Benign hypertrophy of prostate 02/16/2016    Past Surgical History  Procedure Laterality Date  . Cholecystectomy      Current Outpatient Rx  Name  Route  Sig  Dispense  Refill  . ARIPiprazole (ABILIFY) 10 MG tablet   Oral   Take 1 tablet (10 mg total) by mouth daily.   30 tablet   0   . clonazePAM (KLONOPIN) 0.5 MG tablet   Oral   Take 1 tablet (0.5 mg total) by mouth at bedtime.   30 tablet   0   . cyanocobalamin (,VITAMIN B-12,) 1000 MCG/ML injection   Intramuscular   Inject 1 mL (1,000 mcg total) into the muscle daily.   4 mL   0   . escitalopram (LEXAPRO) 20 MG tablet   Oral   Take 1 tablet  (20 mg total) by mouth daily.   30 tablet   0   . Multiple Vitamin (MULTIVITAMIN WITH MINERALS) TABS tablet   Oral   Take 1 tablet by mouth daily.         . tamsulosin (FLOMAX) 0.4 MG CAPS capsule   Oral   Take 0.4 mg by mouth daily after supper.         . traZODone (DESYREL) 100 MG tablet   Oral   Take 1 tablet (100 mg total) by mouth at bedtime.   30 tablet   0     Allergies Review of patient's allergies indicates no known allergies.  Family History  Problem Relation Age of Onset  . Depression Mother   . Thyroid disease Mother   . Alcohol abuse Father   . Emphysema Father   . Breast cancer Sister     two times  . Depression Sister   . Alcohol abuse Brother   . Depression Sister     Social History Social History  Substance Use Topics  . Smoking status: Former Smoker    Types: Cigarettes    Quit date: 02/25/1981  . Smokeless tobacco: Former Neurosurgeon    Quit date: 02/25/1981  . Alcohol Use: No    Review of Systems Constitutional: No fever/chills Eyes: No visual changes. ENT: No  sore throat. Cardiovascular: Denies chest pain.Stuck sensation in the lower chest. States he's had this many times, but usually food as passed on its own after a while particularly breads and meats. Respiratory: Denies shortness of breath. Gastrointestinal: No abdominal pain.  No nausea, no vomiting.  No diarrhea.  No constipation. Genitourinary: Negative for dysuria. Musculoskeletal: Negative for back pain. Skin: Negative for rash. Neurological: Negative for headaches, focal weakness or numbness.  10-point ROS otherwise negative.  ____________________________________________   PHYSICAL EXAM:  VITAL SIGNS: ED Triage Vitals  Enc Vitals Group     BP 04/15/16 1815 152/87 mmHg     Pulse Rate 04/15/16 1812 92     Resp 04/15/16 1812 22     Temp 04/15/16 1812 99.3 F (37.4 C)     Temp Source 04/15/16 1812 Oral     SpO2 04/15/16 1812 95 %     Weight 04/15/16 1812 247 lb  (112.038 kg)     Height 04/15/16 1812 5\' 11"  (1.803 m)     Head Cir --      Peak Flow --      Pain Score 04/15/16 1814 3     Pain Loc --      Pain Edu? --      Excl. in GC? --    Constitutional: Alert and oriented. Well appearing and in no acute distress. Eyes: Conjunctivae are normal. PERRL. EOMI. Head: Atraumatic. Nose: No congestion/rhinnorhea. Mouth/Throat: Mucous membranes are moist.  Oropharynx non-erythematous.Patient is spitting up clear secretions into emesis basin. Neck: No stridor.   Cardiovascular: Normal rate, regular rhythm. Grossly normal heart sounds.  Good peripheral circulation. Respiratory: Normal respiratory effort.  No retractions. Lungs CTAB. Gastrointestinal: Soft and nontender. No distention. No abdominal bruits. No CVA tenderness. Musculoskeletal: No lower extremity tenderness nor edema.  No joint effusions. Neurologic:  Normal speech and language. No gross focal neurologic deficits are appreciated. No gait instability. Skin:  Skin is warm, dry and intact. No rash noted. Psychiatric: Mood and affect are normal. Speech and behavior are normal.  ____________________________________________   LABS (all labs ordered are listed, but only abnormal results are displayed)  Labs Reviewed  COMPREHENSIVE METABOLIC PANEL - Abnormal; Notable for the following:    Glucose, Bld 117 (*)    Calcium 8.7 (*)    All other components within normal limits  CBC  PROTIME-INR   ____________________________________________  EKG  ED ECG REPORT I, QUALE, MARK, the attending physician, personally viewed and interpreted this ECG.  Date: 04/15/2016 EKG Time: 1900 Rate: 75 Rhythm: normal sinus rhythm QRS Axis: normal Intervals: normal ST/T Wave abnormalities: normal Conduction Disturbances: none Narrative Interpretation: unremarkable  ____________________________________________  RADIOLOGY  US Transvaginal Non-OB (Final result) Result time: 04/15/16 17:21:23    Final result by Rad Results In Interface (04/15/16 17:21:23)   Narrative:   CLINICAL DATA: 70 year old G4 P2 AB2, prior hysterectomy approximately 10 years ago, presenting with acute onset of right lower quadrant abdominal pain and right pelvic pain 2 days ago.  EXAM: TRANSABDOMINAL AND TRANSVAGINAL ULTRASOUND OF PELVIS  DOPPLER ULTRASOUND OF OVARIES  TECHNIQUE: Both transabdominal and transvaginal ultrasound examinations of the pelvis were performed. Transabdominal technique was performed for global imaging of the pelvis including uterus, ovaries, adnexal regions, and pelvic cul-de-sac.  It was necessary to proceed with endovaginal exam following the transabdominal exam to visualize the ovaries. Color and duplex Doppler ultrasound was utilized to evaluate blood flow to the ovaries.  COMPARISON: CT abdomen and pelvis 04/13/2016. Pelvic ultrasound 04/29/2008.  FINDINGS:  Uterus  Surgically absent.  Endometrium  Surgically absent.  Right ovary  Measurements: Approximately 2.3 x 2.6 x 2.6 cm. Small follicular cysts. Collapsing functional cyst with surrounding hyperemia as noted on the CT 2 days ago. No other dominant cyst or solid mass. Normal color Doppler flow within the ovary.  Left ovary  Not visualized either transabdominally or transvaginally. The left ovary had a normal appearance on the CT 2 days ago.  Pulsed Doppler evaluation of the visualized right ovary demonstrates normal low-resistance arterial and venous waveforms.  Other findings  No abnormal free fluid.  IMPRESSION: 1. Normal-appearing right ovary. 2. Nonvisualization of the left ovary. The left ovary had a normal appearance on the CT two days 3. Surgically absent uterus. No adnexal masses or free pelvic fluid.   Electronically Signed By: Hulan Saas M.D. On: 04/15/2016 17:21          US Pelvis Complete (Final result) Result time: 04/15/16 17:21:23   Final result by Rad  Results In Interface (04/15/16 17:21:23)   Narrative:   CLINICAL DATA: 69 year old G4 P2 AB2, prior hysterectomy approximately 10 years ago, presenting with acute onset of right lower quadrant abdominal pain and right pelvic pain 2 days ago.  EXAM: TRANSABDOMINAL AND TRANSVAGINAL ULTRASOUND OF PELVIS  DOPPLER ULTRASOUND OF OVARIES  TECHNIQUE: Both transabdominal and transvaginal ultrasound examinations of the pelvis were performed. Transabdominal technique was performed for global imaging of the pelvis including uterus, ovaries, adnexal regions, and pelvic cul-de-sac.  It was necessary to proceed with endovaginal exam following the transabdominal exam to visualize the ovaries. Color and duplex Doppler ultrasound was utilized to evaluate blood flow to the ovaries.  COMPARISON: CT abdomen and pelvis 04/13/2016. Pelvic ultrasound 04/29/2008.  FINDINGS: Uterus  Surgically absent.  Endometrium  Surgically absent.  Right ovary  Measurements: Approximately 2.3 x 2.6 x 2.6 cm. Small follicular cysts. Collapsing functional cyst with surrounding hyperemia as noted on the CT 2 days ago. No other dominant cyst or solid mass. Normal color Doppler flow within the ovary.  Left ovary  Not visualized either transabdominally or transvaginally. The left ovary had a normal appearance on the CT 2 days ago.  Pulsed Doppler evaluation of the visualized right ovary demonstrates normal low-resistance arterial and venous waveforms.  Other findings  No abnormal free fluid.  IMPRESSION: 1. Normal-appearing right ovary. 2. Nonvisualization of the left ovary. The left ovary had a normal appearance on the CT two days 3. Surgically absent uterus. No adnexal masses or free pelvic fluid.   Electronically Signed By: Hulan Saas M.D. On: 04/15/2016 17:21          [place] Art/Ven Flow Abd Pelv Doppler (Final result) Result time: 04/15/16 17:21:23   Final result by Rad  Results In Interface (04/15/16 17:21:23)   Narrative:   CLINICAL DATA: 70 year old G4 P2 AB2, prior hysterectomy approximately 10 years ago, presenting with acute onset of right lower quadrant abdominal pain and right pelvic pain 2 days ago.  EXAM: TRANSABDOMINAL AND TRANSVAGINAL ULTRASOUND OF PELVIS  DOPPLER ULTRASOUND OF OVARIES  TECHNIQUE: Both transabdominal and transvaginal ultrasound examinations of the pelvis were performed. Transabdominal technique was performed for global imaging of the pelvis including uterus, ovaries, adnexal regions, and pelvic cul-de-sac.  It was necessary to proceed with endovaginal exam following the transabdominal exam to visualize the ovaries. Color and duplex Doppler ultrasound was utilized to evaluate blood flow to the ovaries.  COMPARISON: CT abdomen and pelvis 04/13/2016. Pelvic ultrasound 04/29/2008.  FINDINGS: Uterus  Surgically absent.  Endometrium  Surgically absent.  Right ovary  Measurements: Approximately 2.3 x 2.6 x 2.6 cm. Small follicular cysts. Collapsing functional cyst with surrounding hyperemia as noted on the CT 2 days ago. No other dominant cyst or solid mass. Normal color Doppler flow within the ovary.  Left ovary  Not visualized either transabdominally or transvaginally. The left ovary had a normal appearance on the CT 2 days ago.  Pulsed Doppler evaluation of the visualized right ovary demonstrates normal low-resistance arterial and venous waveforms.  Other findings  No abnormal free fluid.  IMPRESSION: 1. Normal-appearing right ovary. 2. Nonvisualization of the left ovary. The left ovary had a normal appearance on the CT two days 3. Surgically absent uterus. No adnexal masses or free pelvic fluid.   Electronically Signed By: Hulan Saas M.D. On: 04/15/2016 17:21          DG Abd 2 Views (Final result) Result time: 04/15/16 16:44:25   Final result by Rad Results In Interface  (04/15/16 16:44:25)   Narrative:   CLINICAL DATA: Right-sided abdominal pain, vomiting, and diarrhea for 2 days. Irritable bowel syndrome. Diverticulosis.  EXAM: ABDOMEN - 2 VIEW  COMPARISON: 02/05/2014  FINDINGS: The bowel gas pattern is normal. There is no evidence of free air. Contrast is seen within the colon from recent CT. No definite radiopaque calculi identified. Right upper quadrant surgical clips seen from prior cholecystectomy.  IMPRESSION: Normal bowel gas pattern. No acute findings.   Electronically Signed By: Myles Rosenthal M.D. On: 04/15/2016 16:44       ____________________________________________   PROCEDURES  Procedure(s) performed: None  Critical Care performed: No  ____________________________________________   INITIAL IMPRESSION / ASSESSMENT AND PLAN / ED COURSE  Pertinent labs & imaging results that were available during my care of the patient were reviewed by me and considered in my medical decision making (see chart for details).  Patient presents for what appears to be an esophageal foreign body, unable to pass after morphine and nitroglycerin. Also attempted carbonate beverage unable. Discussed with Dr. Servando Snare of gastroenterology was coming to evaluate. The patient does not have any cardiac symptoms, reassuring and normal EKG. No evidence of esophageal perforation, and the patient reports a history of similar symptoms in the past hours occurring with eating but having only spelled pass this on his own.   ____________________________________________   FINAL CLINICAL IMPRESSION(S) / ED DIAGNOSES  Final diagnoses:  Esophageal foreign body, initial encounter      Sharyn Creamer, MD 04/15/16 1919

## 2016-04-15 NOTE — Anesthesia Procedure Notes (Signed)
Procedure Name: Intubation Date/Time: 04/15/2016 8:36 PM Performed by: RUEAVWUKILDUFF, Aniken Monestime Pre-anesthesia Checklist: Timeout performed, Patient being monitored, Suction available, Emergency Drugs available and Patient identified Patient Re-evaluated:Patient Re-evaluated prior to inductionOxygen Delivery Method: Circle system utilized Preoxygenation: Pre-oxygenation with 100% oxygen Intubation Type: IV induction, Cricoid Pressure applied and Rapid sequence Laryngoscope Size: Mac and 3 Grade View: Grade II Tube type: Oral Placement Confirmation: ETT inserted through vocal cords under direct vision,  CO2 detector,  breath sounds checked- equal and bilateral and positive ETCO2 Secured at: 22 cm Tube secured with: Tape

## 2016-04-15 NOTE — ED Notes (Signed)
Report from bill, rn.  

## 2016-04-15 NOTE — Transfer of Care (Signed)
Immediate Anesthesia Transfer of Care Note  Patient: Mario Knapp  Procedure(s) Performed: Procedure(s): ESOPHAGOGASTRODUODENOSCOPY (EGD) with removal of foreign bodu (N/A)  Patient Location: PACU  Anesthesia Type:General  Level of Consciousness: awake, alert  and oriented  Airway & Oxygen Therapy: Patient Spontanous Breathing and Patient connected to face mask oxygen  Post-op Assessment: Report given to RN  Post vital signs: Reviewed and stable  Last Vitals:  Filed Vitals:   04/15/16 1945 04/15/16 2107  BP:  141/70  Pulse: 88 84  Temp:  36.6 C  Resp: 18 20    Last Pain:  Filed Vitals:   04/15/16 2109  PainSc: 6          Complications: No apparent anesthesia complications

## 2016-04-15 NOTE — H&P (Signed)
  [name], MD [facility] [address] [place], [place] [address] Phone: [phone] Fax : [phone]  Primary Care Physician:  Mick SellFITZGERALD, DAVID P, MD Primary Gastroenterologist:  Dr. Servando SnareWohl  Pre-Procedure History & Physical: HPI:  Mario Knapp is a 70 y.o. male is here for an endoscopy.   Past Medical History  Diagnosis Date  . Depression   . Sleep apnea   . Arthritis     Past Surgical History  Procedure Laterality Date  . Cholecystectomy      Prior to Admission medications   Medication Sig Start Date End Date Taking? Authorizing Provider  ARIPiprazole (ABILIFY) 10 MG tablet Take 10 mg by mouth at bedtime.   Yes Historical Provider, MD  clonazePAM (KLONOPIN) 0.5 MG tablet Take 1 tablet (0.5 mg total) by mouth at bedtime. 03/29/16  Yes Brandy HaleUzma Faheem, MD  escitalopram (LEXAPRO) 20 MG tablet Take 1 tablet (20 mg total) by mouth daily. 03/29/16  Yes Brandy HaleUzma Faheem, MD  ibuprofen (ADVIL,MOTRIN) 200 MG tablet Take 400 mg by mouth at bedtime.   Yes Historical Provider, MD  Multiple Vitamin (MULTIVITAMIN WITH MINERALS) TABS tablet Take 1 tablet by mouth daily.   Yes Historical Provider, MD  tamsulosin (FLOMAX) 0.4 MG CAPS capsule Take 0.4 mg by mouth daily after supper.   Yes Historical Provider, MD  vitamin B-12 (CYANOCOBALAMIN) 1000 MCG tablet Take 1,000 mcg by mouth daily.   Yes Historical Provider, MD    Allergies as of 04/15/2016  . (No Known Allergies)    Family History  Problem Relation Age of Onset  . Depression Mother   . Thyroid disease Mother   . Alcohol abuse Father   . Emphysema Father   . Breast cancer Sister     two times  . Depression Sister   . Alcohol abuse Brother   . Depression Sister     Social History   Social History  . Marital Status: Married    Spouse Name: N/A  . Number of Children: N/A  . Years of Education: N/A   Occupational History  . Not on file.   Social History Main Topics  . Smoking status: Former Smoker    Types:  Cigarettes    Quit date: 02/25/1981  . Smokeless tobacco: Former NeurosurgeonUser    Quit date: 02/25/1981  . Alcohol Use: No  . Drug Use: No  . Sexual Activity: Yes   Other Topics Concern  . Not on file   Social History Narrative    Review of Systems: See HPI, otherwise negative ROS  Physical Exam: BP 165/72 mmHg  Pulse 88  Temp(Src) 99.3 F (37.4 C) (Oral)  Resp 18  Ht 5\' 11"  (1.803 m)  Wt 247 lb (112.038 kg)  BMI 34.46 kg/m2  SpO2 94% General:   Alert,  pleasant and cooperative in NAD Head:  Normocephalic and atraumatic. Neck:  Supple; no masses or thyromegaly. Lungs:  Clear throughout to auscultation.    Heart:  Regular rate and rhythm. Abdomen:  Soft, nontender and nondistended. Normal bowel sounds, without guarding, and without rebound.   Neurologic:  Alert and  oriented x4;  grossly normal neurologically.  Impression/Plan: Mario Knapp is here for an endoscopy to be performed for food impaction  Risks, benefits, limitations, and alternatives regarding  endoscopy have been reviewed with the patient.  Questions have been answered.  All parties agreeable.   Midge Miniumarren Robinette Esters, MD  04/15/2016, 8:29 PM

## 2016-04-15 NOTE — Anesthesia Preprocedure Evaluation (Signed)
Anesthesia Evaluation  Patient identified by MRN, date of birth, ID band Patient awake    Reviewed: Allergy & Precautions, NPO status , Patient's Chart, lab work & pertinent test results  Airway Mallampati: III  TM Distance: >3 FB Neck ROM: Full    Dental  (+) Chipped, Caps   Pulmonary sleep apnea and Continuous Positive Airway Pressure Ventilation , former smoker,    Pulmonary exam normal        Cardiovascular negative cardio ROS Normal cardiovascular exam     Neuro/Psych PSYCHIATRIC DISORDERS Anxiety Depression negative neurological ROS     GI/Hepatic Neg liver ROS, Foreign body in esophagus   Endo/Other  negative endocrine ROS  Renal/GU negative Renal ROS  negative genitourinary   Musculoskeletal  (+) Arthritis , Osteoarthritis,    Abdominal (+) + obese,   Peds negative pediatric ROS (+)  Hematology negative hematology ROS (+)   Anesthesia Other Findings HTN in the past, but off meds now. obesity  Reproductive/Obstetrics                             Anesthesia Physical Anesthesia Plan  ASA: II and emergent  Anesthesia Plan: General   Post-op Pain Management:    Induction: Intravenous, Rapid sequence and Cricoid pressure planned  Airway Management Planned: Oral ETT  Additional Equipment:   Intra-op Plan:   Post-operative Plan: Extubation in OR  Informed Consent: I have reviewed the patients History and Physical, chart, labs and discussed the procedure including the risks, benefits and alternatives for the proposed anesthesia with the patient or authorized representative who has indicated his/her understanding and acceptance.   Dental advisory given  Plan Discussed with: CRNA and Surgeon  Anesthesia Plan Comments:         Anesthesia Quick Evaluation

## 2016-04-15 NOTE — ED Notes (Signed)
Report to [place], rn.

## 2016-04-18 ENCOUNTER — Encounter: Payer: Self-pay | Admitting: Gastroenterology

## 2016-04-18 NOTE — Anesthesia Postprocedure Evaluation (Signed)
Anesthesia Post Note  Patient: Windell HummingbirdJames Alvin Cassel  Procedure(s) Performed: Procedure(s) (LRB): ESOPHAGOGASTRODUODENOSCOPY (EGD) with removal of foreign bodu (N/A)  Patient location during evaluation: PACU Anesthesia Type: General Level of consciousness: awake and alert and oriented Pain management: pain level controlled Vital Signs Assessment: post-procedure vital signs reviewed and stable Respiratory status: spontaneous breathing Cardiovascular status: blood pressure returned to baseline Anesthetic complications: no    Last Vitals:  Filed Vitals:   04/15/16 1945 04/15/16 2107  BP:  141/70  Pulse: 88 84  Temp:  37.1 C  Resp: 18 20    Last Pain:  Filed Vitals:   04/15/16 2124  PainSc: Asleep                 Candie Gintz

## 2016-04-19 ENCOUNTER — Encounter: Payer: Self-pay | Admitting: *Deleted

## 2016-04-19 ENCOUNTER — Encounter: Payer: Self-pay | Admitting: Psychiatry

## 2016-04-19 ENCOUNTER — Ambulatory Visit (INDEPENDENT_AMBULATORY_CARE_PROVIDER_SITE_OTHER): Payer: PPO | Admitting: Psychiatry

## 2016-04-19 ENCOUNTER — Other Ambulatory Visit: Payer: Self-pay | Admitting: *Deleted

## 2016-04-19 VITALS — BP 118/72 | HR 101 | Temp 97.3°F | Ht 71.0 in | Wt 246.4 lb

## 2016-04-19 DIAGNOSIS — F332 Major depressive disorder, recurrent severe without psychotic features: Secondary | ICD-10-CM | POA: Diagnosis not present

## 2016-04-19 DIAGNOSIS — F4010 Social phobia, unspecified: Secondary | ICD-10-CM

## 2016-04-19 MED ORDER — ARIPIPRAZOLE 10 MG PO TABS: ORAL_TABLET | ORAL | Status: DC

## 2016-04-19 MED ORDER — CLONAZEPAM 0.5 MG PO TABS: ORAL_TABLET | ORAL | Status: DC

## 2016-04-19 NOTE — Patient Outreach (Signed)
[facility] Care Management  04/19/2016  Mario Knapp 06/20/1946 [id number]   No response from patient outreach attempts, will proceed with case closure.  Objective: Per chart review: Patient hospitalized 02/16/16 - 02/23/16 for Sucidial Ideations and worsening depression. Patient also has a history of OSA, BPH, and depression.   Assessment: Received [facility] referral on 03/21/16. Referral source: Mario Knapp. Referral reason: Major depressive disorder recurrent severe without psych features, length of stay is 7, no prior history. Disease and symptom management. Patient refused LCSW follow up due to history with psychologist and upcoming appointment with psychiatrist on 03/22/16. [facility].   Plan: RNCM will send patient's primary MD case closure letter due to unable to reach.  RNCM will send case closure due to unable to reach [facility].   Vylet Maffia H. Gardiner Barefootooper RN, BSN, CCM [facility] Telephonic CM Phone: [phone] Fax: [phone]
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
## 2016-04-25 ENCOUNTER — Other Ambulatory Visit: Payer: Self-pay | Admitting: Psychiatry

## 2016-05-03 ENCOUNTER — Encounter: Payer: Self-pay | Admitting: Psychiatry

## 2016-05-03 ENCOUNTER — Ambulatory Visit (INDEPENDENT_AMBULATORY_CARE_PROVIDER_SITE_OTHER): Payer: PPO | Admitting: Psychiatry

## 2016-05-03 VITALS — BP 124/76 | HR 100 | Temp 97.1°F | Ht 71.0 in | Wt 245.8 lb

## 2016-05-03 DIAGNOSIS — F332 Major depressive disorder, recurrent severe without psychotic features: Secondary | ICD-10-CM | POA: Diagnosis not present

## 2016-05-03 DIAGNOSIS — F4010 Social phobia, unspecified: Secondary | ICD-10-CM | POA: Diagnosis not present

## 2016-05-03 MED ORDER — ARIPIPRAZOLE 5 MG PO TABS: 5.0000 mg | ORAL_TABLET | Freq: Every day | ORAL | Status: DC

## 2016-05-03 MED ORDER — ESCITALOPRAM OXALATE 20 MG PO TABS: 20.0000 mg | ORAL_TABLET | Freq: Every day | ORAL | Status: DC

## 2016-05-03 MED ORDER — METHYLPHENIDATE HCL 5 MG PO TABS: 5.0000 mg | ORAL_TABLET | Freq: Every morning | ORAL | Status: DC
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
## 2016-05-09 ENCOUNTER — Encounter: Payer: Self-pay | Admitting: *Deleted

## 2016-05-11 ENCOUNTER — Encounter
Admission: RE | Disposition: A | Discharge status: Home / Self Care | Payer: Self-pay | Source: Ambulatory Visit | Attending: Unknown Physician Specialty

## 2016-05-11 ENCOUNTER — Ambulatory Visit
Admission: RE | Admit: 2016-05-11 | Discharge: 2016-05-11 | Disposition: A | Discharge status: Home / Self Care | Payer: PPO | Source: Ambulatory Visit | Attending: Unknown Physician Specialty | Admitting: Unknown Physician Specialty

## 2016-05-11 ENCOUNTER — Ambulatory Visit: Payer: PPO | Admitting: Anesthesiology

## 2016-05-11 DIAGNOSIS — G4730 Sleep apnea, unspecified: Secondary | ICD-10-CM | POA: Diagnosis not present

## 2016-05-11 DIAGNOSIS — M1990 Unspecified osteoarthritis, unspecified site: Secondary | ICD-10-CM | POA: Insufficient documentation

## 2016-05-11 DIAGNOSIS — K2960 Other gastritis without bleeding: Secondary | ICD-10-CM | POA: Diagnosis not present

## 2016-05-11 DIAGNOSIS — F329 Major depressive disorder, single episode, unspecified: Secondary | ICD-10-CM | POA: Diagnosis not present

## 2016-05-11 DIAGNOSIS — K2980 Duodenitis without bleeding: Secondary | ICD-10-CM | POA: Insufficient documentation

## 2016-05-11 DIAGNOSIS — Z79899 Other long term (current) drug therapy: Secondary | ICD-10-CM | POA: Diagnosis not present

## 2016-05-11 DIAGNOSIS — K2950 Unspecified chronic gastritis without bleeding: Secondary | ICD-10-CM | POA: Diagnosis not present

## 2016-05-11 DIAGNOSIS — Z87891 Personal history of nicotine dependence: Secondary | ICD-10-CM | POA: Insufficient documentation

## 2016-05-11 DIAGNOSIS — K319 Disease of stomach and duodenum, unspecified: Secondary | ICD-10-CM | POA: Diagnosis not present

## 2016-05-11 DIAGNOSIS — K222 Esophageal obstruction: Secondary | ICD-10-CM | POA: Diagnosis not present

## 2016-05-11 DIAGNOSIS — K2990 Gastroduodenitis, unspecified, without bleeding: Secondary | ICD-10-CM | POA: Diagnosis not present

## 2016-05-11 DIAGNOSIS — R1310 Dysphagia, unspecified: Secondary | ICD-10-CM | POA: Diagnosis not present

## 2016-05-11 DIAGNOSIS — K2900 Acute gastritis without bleeding: Secondary | ICD-10-CM | POA: Diagnosis not present

## 2016-05-11 HISTORY — PX: ESOPHAGOGASTRODUODENOSCOPY (EGD) WITH PROPOFOL: SHX5813

## 2016-05-11 HISTORY — PX: BALLOON DILATION: SHX5330

## 2016-05-11 SURGERY — ESOPHAGOGASTRODUODENOSCOPY (EGD) WITH PROPOFOL
Anesthesia: General

## 2016-05-11 MED ORDER — PROPOFOL 500 MG/50ML IV EMUL
INTRAVENOUS | Status: DC | PRN
  Administered 2016-05-11: 100 ug/kg/min via INTRAVENOUS

## 2016-05-11 MED ORDER — MIDAZOLAM HCL 2 MG/2ML IJ SOLN
INTRAMUSCULAR | Status: DC | PRN
  Administered 2016-05-11: 1 mg via INTRAVENOUS

## 2016-05-11 MED ORDER — LIDOCAINE HCL (CARDIAC) 20 MG/ML IV SOLN
INTRAVENOUS | Status: DC | PRN
  Administered 2016-05-11: 30 mg via INTRAVENOUS

## 2016-05-11 MED ORDER — SODIUM CHLORIDE 0.9 % IV SOLN
INTRAVENOUS | Status: DC
Start: 2016-05-11 — End: 2016-05-11

## 2016-05-11 MED ORDER — SODIUM CHLORIDE 0.9 % IV SOLN
INTRAVENOUS | Status: DC
  Administered 2016-05-11: 1000 mL via INTRAVENOUS

## 2016-05-11 MED ORDER — FENTANYL CITRATE (PF) 100 MCG/2ML IJ SOLN
INTRAMUSCULAR | Status: DC | PRN
  Administered 2016-05-11: 50 ug via INTRAVENOUS

## 2016-05-11 NOTE — Anesthesia Postprocedure Evaluation (Signed)
Anesthesia Post Note  Patient: Mario Knapp  Procedure(s) Performed: Procedure(s) (LRB): ESOPHAGOGASTRODUODENOSCOPY (EGD) WITH PROPOFOL (N/A) BALLOON DILATION (N/A)  Patient location during evaluation: PACU Anesthesia Type: General Level of consciousness: awake Pain management: pain level controlled Vital Signs Assessment: post-procedure vital signs reviewed and stable Respiratory status: spontaneous breathing Cardiovascular status: stable Anesthetic complications: no    Last Vitals:  Filed Vitals:   05/11/16 1151 05/11/16 1201  BP: 89/61 123/73  Pulse: 62 59  Temp: 35.6 C   Resp: 16 16    Last Pain: There were no vitals filed for this visit.               VAN STAVEREN,Hillarie Harrigan

## 2016-05-11 NOTE — Anesthesia Preprocedure Evaluation (Signed)
Anesthesia Evaluation  Patient identified by MRN, date of birth, ID band Patient awake    Reviewed: Allergy & Precautions, NPO status , Patient's Chart, lab work & pertinent test results  History of Anesthesia Complications Negative for: history of anesthetic complications  Airway Mallampati: II       Dental   Pulmonary sleep apnea and Continuous Positive Airway Pressure Ventilation , former smoker,           Cardiovascular negative cardio ROS       Neuro/Psych Depression negative neurological ROS     GI/Hepatic negative GI ROS, Neg liver ROS,   Endo/Other  negative endocrine ROS  Renal/GU negative Renal ROS     Musculoskeletal  (+) Arthritis , Osteoarthritis,    Abdominal   Peds  Hematology negative hematology ROS (+)   Anesthesia Other Findings   Reproductive/Obstetrics                             Anesthesia Physical Anesthesia Plan  ASA: III  Anesthesia Plan: General   Post-op Pain Management:    Induction: Intravenous  Airway Management Planned: Nasal Cannula  Additional Equipment:   Intra-op Plan:   Post-operative Plan:   Informed Consent: I have reviewed the patients History and Physical, chart, labs and discussed the procedure including the risks, benefits and alternatives for the proposed anesthesia with the patient or authorized representative who has indicated his/her understanding and acceptance.     Plan Discussed with:   Anesthesia Plan Comments:         Anesthesia Quick Evaluation

## 2016-05-11 NOTE — H&P (Signed)
   Primary Care Physician:  Mick SellFITZGERALD, DAVID P, MD Primary Gastroenterologist:  Dr. Mechele CollinElliott  Pre-Procedure History & Physical: HPI:  Mario Knapp is a 70 y.o. male is here for an endoscopy.   Past Medical History  Diagnosis Date  . Depression   . Sleep apnea   . Arthritis     Past Surgical History  Procedure Laterality Date  . Cholecystectomy    . Esophagogastroduodenoscopy N/A 04/15/2016    Procedure: ESOPHAGOGASTRODUODENOSCOPY (EGD) with removal of foreign bodu;  Surgeon: Midge Miniumarren Wohl, MD;  Location: [facility] ENDOSCOPY;  Service: Endoscopy;  Laterality: N/A;    Prior to Admission medications   Medication Sig Start Date End Date Taking? Authorizing Provider  ARIPiprazole (ABILIFY) 5 MG tablet Take 1 tablet (5 mg total) by mouth daily. 05/03/16  Yes Brandy HaleUzma Faheem, MD  escitalopram (LEXAPRO) 20 MG tablet Take 1 tablet (20 mg total) by mouth daily. 05/03/16  Yes Brandy HaleUzma Faheem, MD  Multiple Vitamin (MULTIVITAMIN WITH MINERALS) TABS tablet Take 1 tablet by mouth daily.   Yes Historical Provider, MD  ibuprofen (ADVIL,MOTRIN) 200 MG tablet Take 400 mg by mouth at bedtime.    Historical Provider, MD  methylphenidate (RITALIN) 5 MG tablet Take 1 tablet (5 mg total) by mouth every morning. 05/03/16   Brandy HaleUzma Faheem, MD  tamsulosin (FLOMAX) 0.4 MG CAPS capsule Take 0.4 mg by mouth daily after supper.    Historical Provider, MD  vitamin B-12 (CYANOCOBALAMIN) 1000 MCG tablet Take 1,000 mcg by mouth daily.    Historical Provider, MD    Allergies as of 05/04/2016  . (No Known Allergies)    Family History  Problem Relation Age of Onset  . Depression Mother   . Thyroid disease Mother   . Alcohol abuse Father   . Emphysema Father   . Breast cancer Sister     two times  . Depression Sister   . Alcohol abuse Brother   . Depression Sister     Social History   Social History  . Marital Status: Married    Spouse Name: N/A  . Number of Children: N/A  . Years of Education: N/A   Occupational  History  . Not on file.   Social History Main Topics  . Smoking status: Former Smoker    Types: Cigarettes    Quit date: 02/25/1981  . Smokeless tobacco: Former NeurosurgeonUser    Quit date: 02/25/1981  . Alcohol Use: No  . Drug Use: No  . Sexual Activity: Yes   Other Topics Concern  . Not on file   Social History Narrative    Review of Systems: See HPI, otherwise negative ROS  Physical Exam: BP 125/72 mmHg  Pulse 68  Temp(Src) 98 F (36.7 C) (Tympanic)  Resp 18  SpO2 98% General:   Alert,  pleasant and cooperative in NAD Head:  Normocephalic and atraumatic. Neck:  Supple; no masses or thyromegaly. Lungs:  Clear throughout to auscultation.    Heart:  Regular rate and rhythm. Abdomen:  Soft, nontender and nondistended. Normal bowel sounds, without guarding, and without rebound.   Neurologic:  Alert and  oriented x4;  grossly normal neurologically.  Impression/Plan: Mario Knapp is here for an endoscopy to be performed for dysphagia with previous food impaction  Risks, benefits, limitations, and alternatives regarding  endoscopy have been reviewed with the patient.  Questions have been answered.  All parties agreeable.   Lynnae PrudeELLIOTT, ROBERT, MD  05/11/2016, 11:28 AM

## 2016-05-11 NOTE — Op Note (Signed)
[facility] Gastroenterology Patient Name: Mario JuneauJames Knapp Procedure Date: 05/11/2016 11:30 AM MRN: [id number] Account #: [id number] Date of Birth: 05/11/1946 Admit Type: Outpatient Age: [age] Room: [facility] ROOM 4 Gender: Male Note Status: Finalized Procedure:            Upper GI endoscopy Indications:          Dysphagia Providers:            Scot Junobert T. Elliott, MD Referring MD:         Clydie Braunavid Fitzgerald (Referring MD) Medicines:            Propofol per Anesthesia Complications:        No immediate complications. Procedure:            Pre-Anesthesia Assessment:                       - After reviewing the risks and benefits, the patient                        was deemed in satisfactory condition to undergo the                        procedure.                       After obtaining informed consent, the endoscope was                        passed under direct vision. Throughout the procedure,                        the patient's blood pressure, pulse, and oxygen                        saturations were monitored continuously. The                        Colonoscope was introduced through the mouth, and                        advanced to the second part of duodenum. The upper GI                        endoscopy was accomplished without difficulty. The                        patient tolerated the procedure well. Findings:      A mild Schatzki ring (acquired) was found at the gastroesophageal       junction. A guidewire was placed and the scope was withdrawn. Dilation       was performed with a Savary dilator with mild resistance at 17 mm.      Multiple dispersed, diminutive non-bleeding erosions were found in the       gastric antrum. There were no stigmata of recent bleeding. Biopsies were       taken with a cold forceps for histology. Biopsies were taken with a cold       forceps for Helicobacter pylori testing.      Diffuse mucosal flattening was found in the second  portion of the       duodenum. Biopsies were taken with a cold forceps  for histology.      Localized mild inflammation characterized by erythema and granularity       was found at the gastroesophageal junction. Biopsies were taken with a       cold forceps for histology. Impression:           - Mild Schatzki ring. Dilated.                       - Non-bleeding erosive gastropathy. Biopsied.                       - Flattened mucosa was found in the duodenum,                        suspicious for celiac disease. Biopsied.                       - Gastritis. Biopsied. Recommendation:       - Await pathology results.                       - soft food for 3 days, eat slowly, chew well, take                        small bites. Take usual medicine. Scot Junobert T Elliott, MD 05/11/2016 11:47:17 AM This report has been signed electronically. Number of Addenda: 0 Note Initiated On: 05/11/2016 11:30 AM      [facility]

## 2016-05-11 NOTE — Anesthesia Procedure Notes (Signed)
Performed by: COOK-MARTIN, Maclean Foister Pre-anesthesia Checklist: Patient identified, Emergency Drugs available, Suction available, Patient being monitored and Timeout performed Patient Re-evaluated:Patient Re-evaluated prior to inductionOxygen Delivery Method: Nasal cannula Preoxygenation: Pre-oxygenation with 100% oxygen Intubation Type: IV induction Airway Equipment and Method: Bite block Placement Confirmation: CO2 detector and positive ETCO2     

## 2016-05-11 NOTE — Transfer of Care (Signed)
Immediate Anesthesia Transfer of Care Note  Patient: Mario HummingbirdJames Alvin Lerew  Procedure(s) Performed: Procedure(s): ESOPHAGOGASTRODUODENOSCOPY (EGD) WITH PROPOFOL (N/A) BALLOON DILATION (N/A)  Patient Location: PACU  Anesthesia Type:General  Level of Consciousness: awake and sedated  Airway & Oxygen Therapy: Patient Spontanous Breathing and Patient connected to nasal cannula oxygen  Post-op Assessment: Report given to RN and Post -op Vital signs reviewed and stable  Post vital signs: Reviewed and stable  Last Vitals:  Filed Vitals:   05/11/16 1008  BP: 125/72  Pulse: 68  Temp: 36.7 C  Resp: 18    Last Pain: There were no vitals filed for this visit.       Complications: No apparent anesthesia complications

## 2016-05-12 ENCOUNTER — Encounter: Payer: Self-pay | Admitting: Unknown Physician Specialty

## 2016-05-15 LAB — SURGICAL PATHOLOGY

## 2016-05-26 DIAGNOSIS — G4733 Obstructive sleep apnea (adult) (pediatric): Secondary | ICD-10-CM | POA: Diagnosis not present

## 2016-05-26 DIAGNOSIS — E669 Obesity, unspecified: Secondary | ICD-10-CM | POA: Diagnosis not present

## 2016-05-31 ENCOUNTER — Encounter: Payer: Self-pay | Admitting: Psychiatry

## 2016-05-31 ENCOUNTER — Ambulatory Visit (INDEPENDENT_AMBULATORY_CARE_PROVIDER_SITE_OTHER): Payer: PPO | Admitting: Psychiatry

## 2016-05-31 VITALS — BP 149/76 | HR 102 | Temp 98.4°F | Ht 71.0 in | Wt 246.6 lb

## 2016-05-31 DIAGNOSIS — F4010 Social phobia, unspecified: Secondary | ICD-10-CM

## 2016-05-31 DIAGNOSIS — F332 Major depressive disorder, recurrent severe without psychotic features: Secondary | ICD-10-CM

## 2016-05-31 MED ORDER — BENZTROPINE MESYLATE 0.5 MG PO TABS: 0.5000 mg | ORAL_TABLET | Freq: Every day | ORAL | 0 refills | Status: DC

## 2016-05-31 MED ORDER — BENZTROPINE MESYLATE 0.5 MG PO TABS: 0.5000 mg | ORAL_TABLET | Freq: Every day | ORAL | Status: DC

## 2016-05-31 MED ORDER — ARIPIPRAZOLE 5 MG PO TABS: 5.0000 mg | ORAL_TABLET | Freq: Every day | ORAL | 1 refills | Status: DC

## 2016-05-31 MED ORDER — ZALEPLON 5 MG PO CAPS: 5.0000 mg | ORAL_CAPSULE | Freq: Every evening | ORAL | 0 refills | Status: DC | PRN

## 2016-05-31 MED ORDER — ESCITALOPRAM OXALATE 20 MG PO TABS: 20.0000 mg | ORAL_TABLET | Freq: Every day | ORAL | 0 refills | Status: DC
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
## 2016-06-14 ENCOUNTER — Telehealth: Payer: Self-pay

## 2016-06-14 NOTE — Telephone Encounter (Signed)
sent in a request for prior auth for zalepton 

## 2016-06-14 NOTE — Telephone Encounter (Signed)
approval on medication.  "there is an existing approval on file for this medication, this is covered until 11-06-16"  Medication approved

## 2016-06-22 ENCOUNTER — Encounter: Payer: Self-pay | Admitting: Psychiatry

## 2016-06-22 ENCOUNTER — Ambulatory Visit (INDEPENDENT_AMBULATORY_CARE_PROVIDER_SITE_OTHER): Payer: PPO | Admitting: Psychiatry

## 2016-06-22 VITALS — BP 144/81 | HR 93 | Temp 97.7°F | Ht 71.0 in | Wt 249.0 lb

## 2016-06-22 DIAGNOSIS — F332 Major depressive disorder, recurrent severe without psychotic features: Secondary | ICD-10-CM

## 2016-06-22 DIAGNOSIS — F4010 Social phobia, unspecified: Secondary | ICD-10-CM | POA: Diagnosis not present

## 2016-06-22 MED ORDER — ESCITALOPRAM OXALATE 20 MG PO TABS: 20.0000 mg | ORAL_TABLET | Freq: Every day | ORAL | 1 refills | Status: DC

## 2016-06-22 MED ORDER — ARIPIPRAZOLE 5 MG PO TABS: 5.0000 mg | ORAL_TABLET | Freq: Every day | ORAL | 1 refills | Status: DC

## 2016-06-22 MED ORDER — BENZTROPINE MESYLATE 0.5 MG PO TABS: 0.5000 mg | ORAL_TABLET | Freq: Every day | ORAL | 1 refills | Status: DC

## 2016-06-22 MED ORDER — ZALEPLON 5 MG PO CAPS: 5.0000 mg | ORAL_CAPSULE | Freq: Every evening | ORAL | 1 refills | Status: DC | PRN
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
## 2016-07-21 DIAGNOSIS — E6609 Other obesity due to excess calories: Secondary | ICD-10-CM | POA: Diagnosis not present

## 2016-07-21 DIAGNOSIS — E782 Mixed hyperlipidemia: Secondary | ICD-10-CM | POA: Diagnosis not present

## 2016-07-21 DIAGNOSIS — I10 Essential (primary) hypertension: Secondary | ICD-10-CM | POA: Diagnosis not present

## 2016-07-21 DIAGNOSIS — G4733 Obstructive sleep apnea (adult) (pediatric): Secondary | ICD-10-CM | POA: Diagnosis not present

## 2016-07-25 ENCOUNTER — Telehealth: Payer: Self-pay

## 2016-07-25 DIAGNOSIS — I10 Essential (primary) hypertension: Secondary | ICD-10-CM | POA: Diagnosis not present

## 2016-07-25 DIAGNOSIS — E6609 Other obesity due to excess calories: Secondary | ICD-10-CM | POA: Diagnosis not present

## 2016-07-25 DIAGNOSIS — Z0000 Encounter for general adult medical examination without abnormal findings: Secondary | ICD-10-CM | POA: Diagnosis not present

## 2016-07-25 DIAGNOSIS — G4733 Obstructive sleep apnea (adult) (pediatric): Secondary | ICD-10-CM | POA: Diagnosis not present

## 2016-07-25 DIAGNOSIS — F332 Major depressive disorder, recurrent severe without psychotic features: Secondary | ICD-10-CM | POA: Diagnosis not present

## 2016-07-25 DIAGNOSIS — E782 Mixed hyperlipidemia: Secondary | ICD-10-CM | POA: Diagnosis not present

## 2016-07-25 NOTE — Telephone Encounter (Signed)
Received a fax requested 90 day supply on his medications.

## 2016-07-25 NOTE — Telephone Encounter (Signed)
called in ok for his medications to be a 90 day supply.  called in abilify, lexapro, and cogentin.  90 days witlh no additional refills.

## 2016-07-28 DIAGNOSIS — G4733 Obstructive sleep apnea (adult) (pediatric): Secondary | ICD-10-CM | POA: Diagnosis not present

## 2016-07-28 DIAGNOSIS — E669 Obesity, unspecified: Secondary | ICD-10-CM | POA: Diagnosis not present

## 2016-08-18 ENCOUNTER — Telehealth: Payer: Self-pay

## 2016-08-18 NOTE — Telephone Encounter (Signed)
DX: Drug induced tremors - EPS

## 2016-08-18 NOTE — Telephone Encounter (Signed)
patient prior auth for benztropine mes .5mg  was denied.  patiient needs a history of parkinson disease or drug induced extrapyramidal symptoms (except tardive dyskinesia) in order for this medication to be approved.  Medical records was sent but because it was not in the office notes it was denied.

## 2016-08-18 NOTE — Telephone Encounter (Signed)
appeal form was completed and  faxed pending review.

## 2016-08-22 ENCOUNTER — Ambulatory Visit: Payer: PPO | Admitting: Psychiatry

## 2016-08-22 NOTE — Telephone Encounter (Signed)
the appeal decision is favorable

## 2016-08-24 ENCOUNTER — Encounter: Payer: Self-pay | Admitting: Psychiatry

## 2016-08-24 ENCOUNTER — Ambulatory Visit (INDEPENDENT_AMBULATORY_CARE_PROVIDER_SITE_OTHER): Payer: PPO | Admitting: Psychiatry

## 2016-08-24 VITALS — BP 124/80 | HR 106 | Temp 97.6°F | Wt 243.4 lb

## 2016-08-24 DIAGNOSIS — F4010 Social phobia, unspecified: Secondary | ICD-10-CM | POA: Diagnosis not present

## 2016-08-24 DIAGNOSIS — F332 Major depressive disorder, recurrent severe without psychotic features: Secondary | ICD-10-CM

## 2016-08-24 MED ORDER — ESCITALOPRAM OXALATE 10 MG PO TABS: 10.0000 mg | ORAL_TABLET | Freq: Every day | ORAL | 0 refills | Status: DC

## 2016-08-24 MED ORDER — MIRTAZAPINE 15 MG PO TABS: 15.0000 mg | ORAL_TABLET | Freq: Every day | ORAL | 0 refills | Status: DC

## 2016-08-24 MED ORDER — LITHIUM CARBONATE ER 300 MG PO TBCR: 300.0000 mg | EXTENDED_RELEASE_TABLET | Freq: Every morning | ORAL | 0 refills | Status: DC
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
## 2016-09-07 ENCOUNTER — Ambulatory Visit (INDEPENDENT_AMBULATORY_CARE_PROVIDER_SITE_OTHER): Payer: PPO | Admitting: Psychiatry

## 2016-09-07 ENCOUNTER — Encounter: Payer: Self-pay | Admitting: Psychiatry

## 2016-09-07 VITALS — BP 139/72 | HR 69 | Temp 98.3°F | Wt 249.0 lb

## 2016-09-07 DIAGNOSIS — F4010 Social phobia, unspecified: Secondary | ICD-10-CM

## 2016-09-07 DIAGNOSIS — F332 Major depressive disorder, recurrent severe without psychotic features: Secondary | ICD-10-CM

## 2016-09-07 MED ORDER — LITHIUM CARBONATE ER 300 MG PO TBCR: 300.0000 mg | EXTENDED_RELEASE_TABLET | Freq: Every morning | ORAL | 1 refills | Status: DC

## 2016-09-07 MED ORDER — ZALEPLON 5 MG PO CAPS: 5.0000 mg | ORAL_CAPSULE | Freq: Every evening | ORAL | 1 refills | Status: DC | PRN

## 2016-09-07 MED ORDER — MIRTAZAPINE 15 MG PO TABS
15.0000 mg | ORAL_TABLET | Freq: Every day | ORAL | 1 refills | Status: DC
Start: 2016-09-07 — End: 2016-11-18

## 2016-09-07 MED ORDER — ESCITALOPRAM OXALATE 10 MG PO TABS: 10.0000 mg | ORAL_TABLET | Freq: Every day | ORAL | 1 refills | Status: DC
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
## 2016-10-07 DIAGNOSIS — G589 Mononeuropathy, unspecified: Secondary | ICD-10-CM | POA: Diagnosis not present

## 2016-10-07 DIAGNOSIS — R202 Paresthesia of skin: Secondary | ICD-10-CM | POA: Diagnosis not present

## 2016-10-26 ENCOUNTER — Telehealth: Payer: Self-pay

## 2016-10-26 NOTE — Telephone Encounter (Signed)
received a fax requesting a 90 day supply of the lithobid 300mg  cr.

## 2016-10-26 NOTE — Telephone Encounter (Signed)
left message that it was ok to do as a 90 day supply on the lithobid.  pt was last seen on 09-07-16. n ext appt  11-08-16

## 2016-10-28 ENCOUNTER — Telehealth: Payer: Self-pay

## 2016-10-28 NOTE — Telephone Encounter (Signed)
left message on dr's line that it was ok to do a 90 day  supply.  and for theleft message on dr's line that it was ok to do a 90 day  supply.  and for them to also void  the 30 day rx m to also void  the 30 day rx

## 2016-10-28 NOTE — Telephone Encounter (Signed)
received a request for a 90 day supply on mirtazapine 15mg .

## 2016-11-04 ENCOUNTER — Other Ambulatory Visit: Payer: Self-pay | Admitting: Psychiatry

## 2016-11-04 DIAGNOSIS — G4733 Obstructive sleep apnea (adult) (pediatric): Secondary | ICD-10-CM | POA: Diagnosis not present

## 2016-11-04 DIAGNOSIS — E669 Obesity, unspecified: Secondary | ICD-10-CM | POA: Diagnosis not present

## 2016-11-08 ENCOUNTER — Ambulatory Visit: Payer: PPO | Admitting: Psychiatry

## 2016-11-12 ENCOUNTER — Other Ambulatory Visit: Payer: Self-pay | Admitting: Psychiatry

## 2016-11-18 ENCOUNTER — Ambulatory Visit (INDEPENDENT_AMBULATORY_CARE_PROVIDER_SITE_OTHER): Payer: PPO | Admitting: Psychiatry

## 2016-11-18 ENCOUNTER — Encounter: Payer: Self-pay | Admitting: Psychiatry

## 2016-11-18 VITALS — BP 116/70 | HR 60 | Temp 98.0°F | Wt 249.2 lb

## 2016-11-18 DIAGNOSIS — F331 Major depressive disorder, recurrent, moderate: Secondary | ICD-10-CM | POA: Diagnosis not present

## 2016-11-18 DIAGNOSIS — F4010 Social phobia, unspecified: Secondary | ICD-10-CM

## 2016-11-18 MED ORDER — MIRTAZAPINE 15 MG PO TABS: 15.0000 mg | ORAL_TABLET | Freq: Every day | ORAL | 1 refills | Status: DC

## 2016-11-18 MED ORDER — ESCITALOPRAM OXALATE 10 MG PO TABS: 10.0000 mg | ORAL_TABLET | Freq: Every day | ORAL | 1 refills | Status: DC

## 2016-11-18 MED ORDER — ZALEPLON 5 MG PO CAPS: 5.0000 mg | ORAL_CAPSULE | Freq: Every evening | ORAL | 2 refills | Status: DC | PRN

## 2016-11-18 MED ORDER — LITHIUM CARBONATE ER 300 MG PO TBCR: 300.0000 mg | EXTENDED_RELEASE_TABLET | Freq: Every morning | ORAL | 1 refills | Status: DC
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
## 2016-11-29 DIAGNOSIS — H2513 Age-related nuclear cataract, bilateral: Secondary | ICD-10-CM | POA: Diagnosis not present

## 2016-12-28 DIAGNOSIS — M5136 Other intervertebral disc degeneration, lumbar region: Secondary | ICD-10-CM | POA: Diagnosis not present

## 2016-12-28 DIAGNOSIS — M5416 Radiculopathy, lumbar region: Secondary | ICD-10-CM | POA: Diagnosis not present

## 2017-02-09 ENCOUNTER — Other Ambulatory Visit: Payer: Self-pay | Admitting: Physical Medicine and Rehabilitation

## 2017-02-09 DIAGNOSIS — M5416 Radiculopathy, lumbar region: Secondary | ICD-10-CM

## 2017-02-15 ENCOUNTER — Ambulatory Visit: Payer: PPO | Admitting: Psychiatry

## 2017-02-20 ENCOUNTER — Ambulatory Visit
Admission: RE | Admit: 2017-02-20 | Discharge: 2017-02-20 | Disposition: A | Discharge status: Home / Self Care | Payer: PPO | Source: Ambulatory Visit | Attending: Physical Medicine and Rehabilitation | Admitting: Physical Medicine and Rehabilitation

## 2017-02-20 DIAGNOSIS — M5416 Radiculopathy, lumbar region: Secondary | ICD-10-CM

## 2017-02-20 DIAGNOSIS — M545 Low back pain: Secondary | ICD-10-CM | POA: Diagnosis not present

## 2017-02-20 DIAGNOSIS — N281 Cyst of kidney, acquired: Secondary | ICD-10-CM | POA: Insufficient documentation

## 2017-02-20 DIAGNOSIS — M5126 Other intervertebral disc displacement, lumbar region: Secondary | ICD-10-CM | POA: Insufficient documentation

## 2017-02-20 DIAGNOSIS — M5127 Other intervertebral disc displacement, lumbosacral region: Secondary | ICD-10-CM | POA: Diagnosis not present

## 2017-02-20 DIAGNOSIS — M5116 Intervertebral disc disorders with radiculopathy, lumbar region: Secondary | ICD-10-CM | POA: Diagnosis not present

## 2017-02-21 DIAGNOSIS — J0100 Acute maxillary sinusitis, unspecified: Secondary | ICD-10-CM | POA: Diagnosis not present

## 2017-02-24 DIAGNOSIS — M5416 Radiculopathy, lumbar region: Secondary | ICD-10-CM | POA: Diagnosis not present

## 2017-02-24 DIAGNOSIS — M5136 Other intervertebral disc degeneration, lumbar region: Secondary | ICD-10-CM | POA: Diagnosis not present

## 2017-03-06 ENCOUNTER — Ambulatory Visit (INDEPENDENT_AMBULATORY_CARE_PROVIDER_SITE_OTHER): Payer: PPO | Admitting: Psychiatry

## 2017-03-06 ENCOUNTER — Encounter: Payer: Self-pay | Admitting: Psychiatry

## 2017-03-06 VITALS — BP 148/80 | HR 71 | Temp 97.6°F | Wt 254.8 lb

## 2017-03-06 DIAGNOSIS — F39 Unspecified mood [affective] disorder: Secondary | ICD-10-CM

## 2017-03-06 DIAGNOSIS — F4010 Social phobia, unspecified: Secondary | ICD-10-CM | POA: Diagnosis not present

## 2017-03-06 MED ORDER — LITHIUM CARBONATE ER 300 MG PO TBCR: 300.0000 mg | EXTENDED_RELEASE_TABLET | Freq: Every morning | ORAL | 1 refills | Status: DC

## 2017-03-06 MED ORDER — MIRTAZAPINE 15 MG PO TABS: 15.0000 mg | ORAL_TABLET | Freq: Every day | ORAL | 1 refills | Status: DC

## 2017-03-06 MED ORDER — ZALEPLON 5 MG PO CAPS: 5.0000 mg | ORAL_CAPSULE | Freq: Every evening | ORAL | 2 refills | Status: DC | PRN

## 2017-03-06 MED ORDER — ESCITALOPRAM OXALATE 10 MG PO TABS: 10.0000 mg | ORAL_TABLET | Freq: Every day | ORAL | 1 refills | Status: DC
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
## 2017-03-31 DIAGNOSIS — E669 Obesity, unspecified: Secondary | ICD-10-CM | POA: Diagnosis not present

## 2017-03-31 DIAGNOSIS — G4733 Obstructive sleep apnea (adult) (pediatric): Secondary | ICD-10-CM | POA: Diagnosis not present

## 2017-04-14 DIAGNOSIS — M5136 Other intervertebral disc degeneration, lumbar region: Secondary | ICD-10-CM | POA: Diagnosis not present

## 2017-04-14 DIAGNOSIS — M5416 Radiculopathy, lumbar region: Secondary | ICD-10-CM | POA: Diagnosis not present

## 2017-05-11 DIAGNOSIS — Z1211 Encounter for screening for malignant neoplasm of colon: Secondary | ICD-10-CM | POA: Diagnosis not present

## 2017-05-11 DIAGNOSIS — K2970 Gastritis, unspecified, without bleeding: Secondary | ICD-10-CM | POA: Diagnosis not present

## 2017-05-11 DIAGNOSIS — R1310 Dysphagia, unspecified: Secondary | ICD-10-CM | POA: Diagnosis not present

## 2017-05-31 ENCOUNTER — Ambulatory Visit: Payer: PPO | Admitting: Psychiatry

## 2017-06-08 ENCOUNTER — Other Ambulatory Visit: Payer: Self-pay | Admitting: Psychiatry

## 2017-06-12 ENCOUNTER — Encounter: Payer: Self-pay | Admitting: Psychiatry

## 2017-06-12 ENCOUNTER — Ambulatory Visit (INDEPENDENT_AMBULATORY_CARE_PROVIDER_SITE_OTHER): Payer: PPO | Admitting: Psychiatry

## 2017-06-12 VITALS — BP 170/81 | HR 76 | Temp 97.8°F | Wt 256.2 lb

## 2017-06-12 DIAGNOSIS — F39 Unspecified mood [affective] disorder: Secondary | ICD-10-CM

## 2017-06-12 DIAGNOSIS — F4010 Social phobia, unspecified: Secondary | ICD-10-CM | POA: Diagnosis not present

## 2017-06-12 MED ORDER — MIRTAZAPINE 15 MG PO TABS: 15.0000 mg | ORAL_TABLET | Freq: Every day | ORAL | 1 refills | Status: DC

## 2017-06-12 MED ORDER — ESCITALOPRAM OXALATE 10 MG PO TABS: 10.0000 mg | ORAL_TABLET | Freq: Every day | ORAL | 1 refills | Status: DC

## 2017-06-12 MED ORDER — LITHIUM CARBONATE ER 300 MG PO TBCR: 300.0000 mg | EXTENDED_RELEASE_TABLET | Freq: Every morning | ORAL | 1 refills | Status: DC

## 2017-06-12 MED ORDER — ZALEPLON 5 MG PO CAPS: 5.0000 mg | ORAL_CAPSULE | Freq: Every evening | ORAL | 2 refills | Status: DC | PRN
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
## 2017-07-26 DIAGNOSIS — E6609 Other obesity due to excess calories: Secondary | ICD-10-CM | POA: Diagnosis not present

## 2017-07-26 DIAGNOSIS — E782 Mixed hyperlipidemia: Secondary | ICD-10-CM | POA: Diagnosis not present

## 2017-07-26 DIAGNOSIS — I10 Essential (primary) hypertension: Secondary | ICD-10-CM | POA: Diagnosis not present

## 2017-07-26 DIAGNOSIS — Z6837 Body mass index (BMI) 37.0-37.9, adult: Secondary | ICD-10-CM | POA: Diagnosis not present

## 2017-07-26 DIAGNOSIS — F332 Major depressive disorder, recurrent severe without psychotic features: Secondary | ICD-10-CM | POA: Diagnosis not present

## 2017-07-26 DIAGNOSIS — Z0000 Encounter for general adult medical examination without abnormal findings: Secondary | ICD-10-CM | POA: Diagnosis not present

## 2017-07-26 DIAGNOSIS — G4733 Obstructive sleep apnea (adult) (pediatric): Secondary | ICD-10-CM | POA: Diagnosis not present

## 2017-07-26 DIAGNOSIS — Z1159 Encounter for screening for other viral diseases: Secondary | ICD-10-CM | POA: Diagnosis not present

## 2017-07-28 DIAGNOSIS — I10 Essential (primary) hypertension: Secondary | ICD-10-CM | POA: Diagnosis not present

## 2017-07-28 DIAGNOSIS — E782 Mixed hyperlipidemia: Secondary | ICD-10-CM | POA: Diagnosis not present

## 2017-09-05 ENCOUNTER — Other Ambulatory Visit: Payer: Self-pay | Admitting: Psychiatry

## 2017-09-11 ENCOUNTER — Other Ambulatory Visit: Payer: Self-pay

## 2017-09-11 ENCOUNTER — Ambulatory Visit: Payer: PPO | Admitting: Psychiatry

## 2017-09-11 ENCOUNTER — Encounter: Payer: Self-pay | Admitting: Psychiatry

## 2017-09-11 VITALS — BP 169/78 | HR 78 | Temp 98.0°F | Wt 264.8 lb

## 2017-09-11 DIAGNOSIS — F39 Unspecified mood [affective] disorder: Secondary | ICD-10-CM | POA: Diagnosis not present

## 2017-09-11 DIAGNOSIS — F4010 Social phobia, unspecified: Secondary | ICD-10-CM

## 2017-09-11 MED ORDER — ZALEPLON 5 MG PO CAPS: 5.0000 mg | ORAL_CAPSULE | Freq: Every evening | ORAL | 3 refills | Status: DC | PRN

## 2017-09-11 MED ORDER — ZALEPLON 5 MG PO CAPS: 5.0000 mg | ORAL_CAPSULE | Freq: Every evening | ORAL | 2 refills | Status: DC | PRN

## 2017-09-11 MED ORDER — MIRTAZAPINE 15 MG PO TABS: 15.0000 mg | ORAL_TABLET | Freq: Every day | ORAL | 1 refills | Status: DC

## 2017-09-11 MED ORDER — ESCITALOPRAM OXALATE 10 MG PO TABS: 10.0000 mg | ORAL_TABLET | Freq: Every day | ORAL | 1 refills | Status: DC

## 2017-09-11 MED ORDER — LITHIUM CARBONATE ER 300 MG PO TBCR: 300.0000 mg | EXTENDED_RELEASE_TABLET | Freq: Every morning | ORAL | 1 refills | Status: DC
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
## 2017-10-23 DIAGNOSIS — R0789 Other chest pain: Secondary | ICD-10-CM | POA: Diagnosis not present

## 2017-10-23 DIAGNOSIS — M546 Pain in thoracic spine: Secondary | ICD-10-CM | POA: Diagnosis not present

## 2017-10-23 DIAGNOSIS — R079 Chest pain, unspecified: Secondary | ICD-10-CM | POA: Diagnosis not present

## 2017-10-23 DIAGNOSIS — M25511 Pain in right shoulder: Secondary | ICD-10-CM | POA: Diagnosis not present

## 2017-10-25 DIAGNOSIS — M25511 Pain in right shoulder: Secondary | ICD-10-CM | POA: Diagnosis not present

## 2017-11-02 ENCOUNTER — Other Ambulatory Visit: Payer: Self-pay | Admitting: Physician Assistant

## 2017-11-02 ENCOUNTER — Ambulatory Visit
Admission: RE | Admit: 2017-11-02 | Discharge: 2017-11-02 | Disposition: A | Discharge status: Home / Self Care | Payer: PPO | Source: Ambulatory Visit | Attending: Physician Assistant | Admitting: Physician Assistant

## 2017-11-02 DIAGNOSIS — R071 Chest pain on breathing: Secondary | ICD-10-CM | POA: Diagnosis not present

## 2017-11-02 DIAGNOSIS — R918 Other nonspecific abnormal finding of lung field: Secondary | ICD-10-CM | POA: Diagnosis not present

## 2017-11-02 DIAGNOSIS — K760 Fatty (change of) liver, not elsewhere classified: Secondary | ICD-10-CM | POA: Diagnosis not present

## 2017-11-02 DIAGNOSIS — M898X1 Other specified disorders of bone, shoulder: Secondary | ICD-10-CM | POA: Diagnosis not present

## 2017-11-02 DIAGNOSIS — I700 Atherosclerosis of aorta: Secondary | ICD-10-CM | POA: Diagnosis not present

## 2017-11-02 DIAGNOSIS — R079 Chest pain, unspecified: Secondary | ICD-10-CM | POA: Diagnosis not present

## 2017-11-02 DIAGNOSIS — N281 Cyst of kidney, acquired: Secondary | ICD-10-CM | POA: Insufficient documentation

## 2017-11-02 DIAGNOSIS — Z9049 Acquired absence of other specified parts of digestive tract: Secondary | ICD-10-CM | POA: Diagnosis not present

## 2017-11-02 DIAGNOSIS — J9811 Atelectasis: Secondary | ICD-10-CM | POA: Insufficient documentation

## 2017-11-02 DIAGNOSIS — I2510 Atherosclerotic heart disease of native coronary artery without angina pectoris: Secondary | ICD-10-CM | POA: Insufficient documentation

## 2017-11-02 DIAGNOSIS — M25511 Pain in right shoulder: Secondary | ICD-10-CM | POA: Diagnosis not present

## 2017-11-02 LAB — POCT I-STAT CREATININE: Creatinine, Ser: 0.9 mg/dL (ref 0.61–1.24)

## 2017-11-02 MED ORDER — IOPAMIDOL (ISOVUE-370) INJECTION 76%
75.0000 mL | Freq: Once | INTRAVENOUS | Status: AC | PRN
  Administered 2017-11-02: 75 mL via INTRAVENOUS

## 2017-11-20 IMAGING — DX DG CHEST 1V PORT
1 series · 1 of 1 positions shown · non-contrast
Comparison: March 17, 2008

CLINICAL DATA: Food stuck in throat

EXAM:
PORTABLE CHEST 1 VIEW

[chest ap]
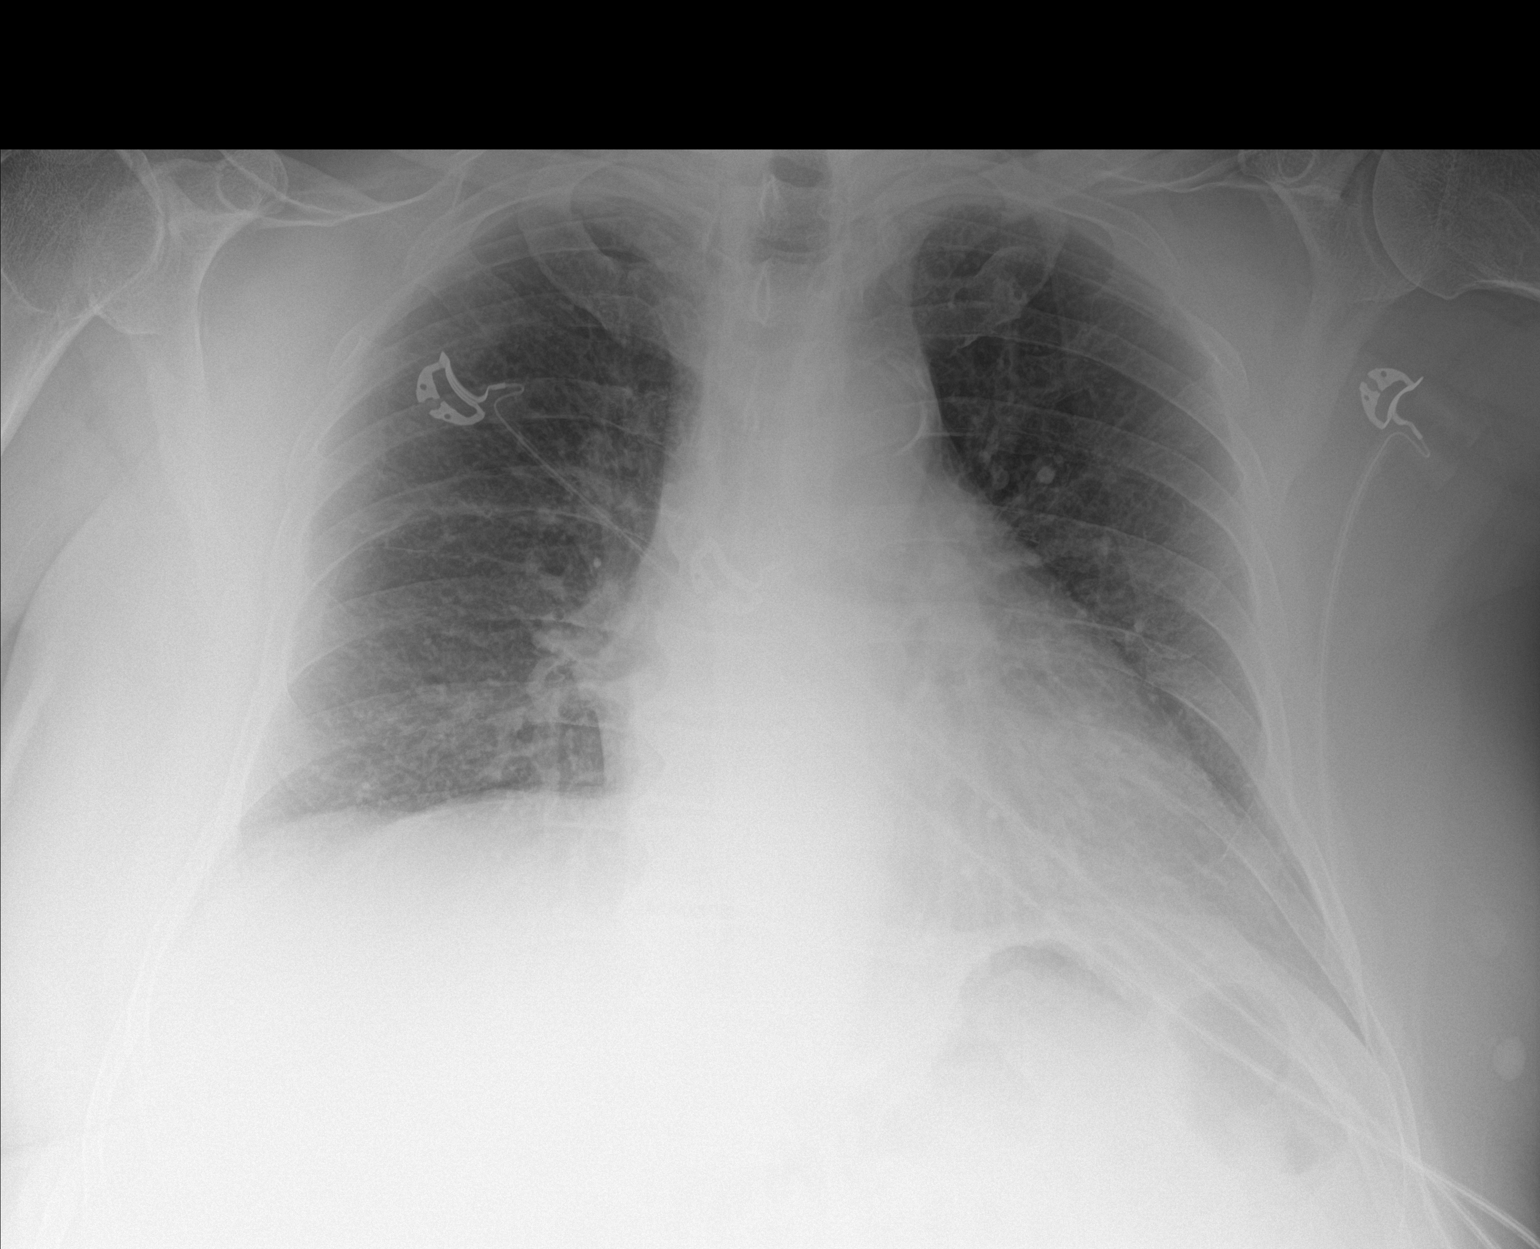

[1 of 1 positions shown; findings below may reference images not displayed]

FINDINGS: The heart size and mediastinal contours are within normal limits.
Both lungs are clear. The visualized skeletal structures are
unremarkable.
IMPRESSION: No active disease.

## 2017-12-04 DIAGNOSIS — G4733 Obstructive sleep apnea (adult) (pediatric): Secondary | ICD-10-CM | POA: Diagnosis not present

## 2017-12-04 DIAGNOSIS — E669 Obesity, unspecified: Secondary | ICD-10-CM | POA: Diagnosis not present

## 2017-12-04 DIAGNOSIS — K219 Gastro-esophageal reflux disease without esophagitis: Secondary | ICD-10-CM | POA: Diagnosis not present

## 2017-12-04 DIAGNOSIS — F341 Dysthymic disorder: Secondary | ICD-10-CM | POA: Diagnosis not present

## 2017-12-11 ENCOUNTER — Ambulatory Visit: Payer: PPO | Admitting: Psychiatry

## 2017-12-11 ENCOUNTER — Encounter: Payer: Self-pay | Admitting: Psychiatry

## 2017-12-11 ENCOUNTER — Other Ambulatory Visit: Payer: Self-pay

## 2017-12-11 VITALS — BP 138/72 | HR 114 | Temp 98.4°F | Wt 262.0 lb

## 2017-12-11 DIAGNOSIS — F4010 Social phobia, unspecified: Secondary | ICD-10-CM

## 2017-12-11 DIAGNOSIS — F39 Unspecified mood [affective] disorder: Secondary | ICD-10-CM | POA: Diagnosis not present

## 2017-12-11 MED ORDER — ESCITALOPRAM OXALATE 10 MG PO TABS: 10.0000 mg | ORAL_TABLET | Freq: Every day | ORAL | 1 refills | Status: DC

## 2017-12-11 MED ORDER — LITHIUM CARBONATE ER 300 MG PO TBCR: 300.0000 mg | EXTENDED_RELEASE_TABLET | Freq: Every morning | ORAL | 1 refills | Status: DC

## 2017-12-11 MED ORDER — ZALEPLON 5 MG PO CAPS: 5.0000 mg | ORAL_CAPSULE | Freq: Every evening | ORAL | 3 refills | Status: DC | PRN

## 2017-12-11 MED ORDER — MIRTAZAPINE 15 MG PO TABS: 15.0000 mg | ORAL_TABLET | Freq: Every day | ORAL | 1 refills | Status: DC
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
## 2018-01-08 DIAGNOSIS — G4733 Obstructive sleep apnea (adult) (pediatric): Secondary | ICD-10-CM | POA: Diagnosis not present

## 2018-01-08 DIAGNOSIS — G4700 Insomnia, unspecified: Secondary | ICD-10-CM | POA: Diagnosis not present

## 2018-01-08 DIAGNOSIS — F341 Dysthymic disorder: Secondary | ICD-10-CM | POA: Diagnosis not present

## 2018-01-08 DIAGNOSIS — K219 Gastro-esophageal reflux disease without esophagitis: Secondary | ICD-10-CM | POA: Diagnosis not present

## 2018-01-09 DIAGNOSIS — G4733 Obstructive sleep apnea (adult) (pediatric): Secondary | ICD-10-CM | POA: Diagnosis not present

## 2018-02-09 DIAGNOSIS — G4733 Obstructive sleep apnea (adult) (pediatric): Secondary | ICD-10-CM | POA: Diagnosis not present

## 2018-02-12 DIAGNOSIS — G4733 Obstructive sleep apnea (adult) (pediatric): Secondary | ICD-10-CM | POA: Diagnosis not present

## 2018-02-12 DIAGNOSIS — K219 Gastro-esophageal reflux disease without esophagitis: Secondary | ICD-10-CM | POA: Diagnosis not present

## 2018-02-12 DIAGNOSIS — F341 Dysthymic disorder: Secondary | ICD-10-CM | POA: Diagnosis not present

## 2018-03-05 ENCOUNTER — Other Ambulatory Visit: Payer: Self-pay

## 2018-03-05 ENCOUNTER — Ambulatory Visit: Payer: PPO | Admitting: Psychiatry

## 2018-03-05 ENCOUNTER — Telehealth: Payer: Self-pay

## 2018-03-05 ENCOUNTER — Encounter: Payer: Self-pay | Admitting: Psychiatry

## 2018-03-05 VITALS — BP 138/88 | HR 105 | Temp 98.3°F | Wt 271.0 lb

## 2018-03-05 DIAGNOSIS — F39 Unspecified mood [affective] disorder: Secondary | ICD-10-CM | POA: Diagnosis not present

## 2018-03-05 DIAGNOSIS — F4010 Social phobia, unspecified: Secondary | ICD-10-CM

## 2018-03-05 MED ORDER — ZALEPLON 5 MG PO CAPS: 5.0000 mg | ORAL_CAPSULE | Freq: Every evening | ORAL | 3 refills | Status: DC | PRN

## 2018-03-05 MED ORDER — LITHIUM CARBONATE ER 300 MG PO TBCR: 300.0000 mg | EXTENDED_RELEASE_TABLET | Freq: Every morning | ORAL | 1 refills | Status: DC

## 2018-03-05 MED ORDER — ESCITALOPRAM OXALATE 10 MG PO TABS: 10.0000 mg | ORAL_TABLET | Freq: Every day | ORAL | 1 refills | Status: DC

## 2018-03-05 MED ORDER — MIRTAZAPINE 15 MG PO TABS: 15.0000 mg | ORAL_TABLET | Freq: Every day | ORAL | 1 refills | Status: DC

## 2018-03-05 NOTE — Telephone Encounter (Signed)
faxed and confirmed rx for zalepion  id # [id number] order  [id number] #[id number]
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
## 2018-03-11 DIAGNOSIS — G4733 Obstructive sleep apnea (adult) (pediatric): Secondary | ICD-10-CM | POA: Diagnosis not present

## 2018-04-30 ENCOUNTER — Ambulatory Visit: Payer: PPO | Admitting: Psychiatry

## 2018-04-30 ENCOUNTER — Encounter: Payer: Self-pay | Admitting: Psychiatry

## 2018-04-30 ENCOUNTER — Other Ambulatory Visit: Payer: Self-pay

## 2018-04-30 VITALS — BP 116/69 | HR 112 | Temp 98.6°F | Wt 266.2 lb

## 2018-04-30 DIAGNOSIS — F39 Unspecified mood [affective] disorder: Secondary | ICD-10-CM

## 2018-04-30 MED ORDER — ESCITALOPRAM OXALATE 10 MG PO TABS: 10.0000 mg | ORAL_TABLET | Freq: Every day | ORAL | 1 refills | Status: DC

## 2018-04-30 MED ORDER — LITHIUM CARBONATE ER 300 MG PO TBCR: 300.0000 mg | EXTENDED_RELEASE_TABLET | Freq: Every morning | ORAL | 1 refills | Status: DC

## 2018-04-30 MED ORDER — TOPIRAMATE 50 MG PO TABS: 50.0000 mg | ORAL_TABLET | Freq: Every day | ORAL | 1 refills | Status: DC

## 2018-04-30 MED ORDER — ZALEPLON 5 MG PO CAPS: 5.0000 mg | ORAL_CAPSULE | Freq: Every evening | ORAL | 3 refills | Status: DC | PRN
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
## 2018-05-22 ENCOUNTER — Other Ambulatory Visit: Payer: Self-pay | Admitting: Psychiatry

## 2018-05-28 ENCOUNTER — Ambulatory Visit: Payer: PPO | Admitting: Psychiatry

## 2018-06-26 DIAGNOSIS — Z1211 Encounter for screening for malignant neoplasm of colon: Secondary | ICD-10-CM | POA: Diagnosis not present

## 2018-06-26 DIAGNOSIS — K760 Fatty (change of) liver, not elsewhere classified: Secondary | ICD-10-CM | POA: Diagnosis not present

## 2018-06-26 DIAGNOSIS — R1310 Dysphagia, unspecified: Secondary | ICD-10-CM | POA: Diagnosis not present

## 2018-06-26 DIAGNOSIS — G4733 Obstructive sleep apnea (adult) (pediatric): Secondary | ICD-10-CM | POA: Diagnosis not present

## 2018-06-26 DIAGNOSIS — K219 Gastro-esophageal reflux disease without esophagitis: Secondary | ICD-10-CM | POA: Diagnosis not present

## 2018-06-28 DIAGNOSIS — K760 Fatty (change of) liver, not elsewhere classified: Secondary | ICD-10-CM | POA: Diagnosis not present

## 2018-07-02 ENCOUNTER — Ambulatory Visit: Payer: PPO | Admitting: Psychiatry

## 2018-07-02 ENCOUNTER — Other Ambulatory Visit: Payer: Self-pay

## 2018-07-02 ENCOUNTER — Encounter: Payer: Self-pay | Admitting: Psychiatry

## 2018-07-02 VITALS — BP 128/73 | HR 80 | Temp 97.9°F | Wt 259.4 lb

## 2018-07-02 DIAGNOSIS — F39 Unspecified mood [affective] disorder: Secondary | ICD-10-CM

## 2018-07-02 MED ORDER — TOPIRAMATE 100 MG PO TABS: 100.0000 mg | ORAL_TABLET | Freq: Every day | ORAL | 1 refills | Status: DC

## 2018-07-02 MED ORDER — LITHIUM CARBONATE ER 300 MG PO TBCR: 300.0000 mg | EXTENDED_RELEASE_TABLET | Freq: Every morning | ORAL | 1 refills | Status: DC

## 2018-07-02 MED ORDER — ZALEPLON 5 MG PO CAPS: 5.0000 mg | ORAL_CAPSULE | Freq: Every evening | ORAL | 3 refills | Status: DC | PRN

## 2018-07-02 MED ORDER — ESCITALOPRAM OXALATE 10 MG PO TABS: 10.0000 mg | ORAL_TABLET | Freq: Every day | ORAL | 1 refills | Status: DC
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
## 2018-07-10 DIAGNOSIS — Z1211 Encounter for screening for malignant neoplasm of colon: Secondary | ICD-10-CM | POA: Diagnosis not present

## 2018-07-10 DIAGNOSIS — Z1212 Encounter for screening for malignant neoplasm of rectum: Secondary | ICD-10-CM | POA: Diagnosis not present

## 2018-07-19 DIAGNOSIS — K219 Gastro-esophageal reflux disease without esophagitis: Secondary | ICD-10-CM | POA: Diagnosis not present

## 2018-07-19 DIAGNOSIS — I10 Essential (primary) hypertension: Secondary | ICD-10-CM | POA: Diagnosis not present

## 2018-07-19 DIAGNOSIS — Z0001 Encounter for general adult medical examination with abnormal findings: Secondary | ICD-10-CM | POA: Diagnosis not present

## 2018-07-19 DIAGNOSIS — E782 Mixed hyperlipidemia: Secondary | ICD-10-CM | POA: Diagnosis not present

## 2018-07-19 DIAGNOSIS — F332 Major depressive disorder, recurrent severe without psychotic features: Secondary | ICD-10-CM | POA: Diagnosis not present

## 2018-07-19 DIAGNOSIS — G4733 Obstructive sleep apnea (adult) (pediatric): Secondary | ICD-10-CM | POA: Diagnosis not present

## 2018-07-19 DIAGNOSIS — K760 Fatty (change of) liver, not elsewhere classified: Secondary | ICD-10-CM | POA: Diagnosis not present

## 2018-07-19 DIAGNOSIS — Z125 Encounter for screening for malignant neoplasm of prostate: Secondary | ICD-10-CM | POA: Diagnosis not present

## 2018-07-23 DIAGNOSIS — I10 Essential (primary) hypertension: Secondary | ICD-10-CM | POA: Diagnosis not present

## 2018-07-23 DIAGNOSIS — K760 Fatty (change of) liver, not elsewhere classified: Secondary | ICD-10-CM | POA: Diagnosis not present

## 2018-07-23 DIAGNOSIS — F332 Major depressive disorder, recurrent severe without psychotic features: Secondary | ICD-10-CM | POA: Diagnosis not present

## 2018-07-23 DIAGNOSIS — E782 Mixed hyperlipidemia: Secondary | ICD-10-CM | POA: Diagnosis not present

## 2018-07-23 DIAGNOSIS — Z0001 Encounter for general adult medical examination with abnormal findings: Secondary | ICD-10-CM | POA: Diagnosis not present

## 2018-07-23 DIAGNOSIS — K219 Gastro-esophageal reflux disease without esophagitis: Secondary | ICD-10-CM | POA: Diagnosis not present

## 2018-07-23 DIAGNOSIS — G4733 Obstructive sleep apnea (adult) (pediatric): Secondary | ICD-10-CM | POA: Diagnosis not present

## 2018-07-23 DIAGNOSIS — Z125 Encounter for screening for malignant neoplasm of prostate: Secondary | ICD-10-CM | POA: Diagnosis not present

## 2018-07-24 ENCOUNTER — Other Ambulatory Visit: Payer: Self-pay | Admitting: Psychiatry

## 2018-08-20 ENCOUNTER — Other Ambulatory Visit: Payer: Self-pay

## 2018-08-20 ENCOUNTER — Ambulatory Visit: Payer: PPO | Admitting: Psychiatry

## 2018-08-20 ENCOUNTER — Encounter: Payer: Self-pay | Admitting: Psychiatry

## 2018-08-20 VITALS — BP 137/84 | HR 102 | Temp 97.8°F | Wt 260.8 lb

## 2018-08-20 DIAGNOSIS — F39 Unspecified mood [affective] disorder: Secondary | ICD-10-CM | POA: Diagnosis not present

## 2018-08-20 DIAGNOSIS — F4010 Social phobia, unspecified: Secondary | ICD-10-CM

## 2018-08-20 MED ORDER — ESCITALOPRAM OXALATE 10 MG PO TABS: 10.0000 mg | ORAL_TABLET | Freq: Every day | ORAL | 1 refills | Status: DC

## 2018-08-20 MED ORDER — TOPIRAMATE 50 MG PO TABS: 150.0000 mg | ORAL_TABLET | Freq: Every day | ORAL | 2 refills | Status: DC

## 2018-08-20 MED ORDER — LITHIUM CARBONATE ER 300 MG PO TBCR: 300.0000 mg | EXTENDED_RELEASE_TABLET | Freq: Every morning | ORAL | 1 refills | Status: DC

## 2018-08-20 MED ORDER — ZALEPLON 10 MG PO CAPS: 10.0000 mg | ORAL_CAPSULE | Freq: Every evening | ORAL | 2 refills | Status: DC | PRN
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
## 2018-08-21 ENCOUNTER — Other Ambulatory Visit: Payer: Self-pay | Admitting: Psychiatry

## 2018-08-21 DIAGNOSIS — M62830 Muscle spasm of back: Secondary | ICD-10-CM | POA: Diagnosis not present

## 2018-08-21 DIAGNOSIS — M5136 Other intervertebral disc degeneration, lumbar region: Secondary | ICD-10-CM | POA: Diagnosis not present

## 2018-09-11 DIAGNOSIS — M47814 Spondylosis without myelopathy or radiculopathy, thoracic region: Secondary | ICD-10-CM | POA: Diagnosis not present

## 2018-09-11 DIAGNOSIS — M62830 Muscle spasm of back: Secondary | ICD-10-CM | POA: Diagnosis not present

## 2018-09-11 DIAGNOSIS — M5136 Other intervertebral disc degeneration, lumbar region: Secondary | ICD-10-CM | POA: Diagnosis not present

## 2018-09-11 DIAGNOSIS — M5489 Other dorsalgia: Secondary | ICD-10-CM | POA: Diagnosis not present

## 2018-09-11 DIAGNOSIS — M47812 Spondylosis without myelopathy or radiculopathy, cervical region: Secondary | ICD-10-CM | POA: Diagnosis not present

## 2018-09-11 DIAGNOSIS — M4184 Other forms of scoliosis, thoracic region: Secondary | ICD-10-CM | POA: Diagnosis not present

## 2018-09-13 DIAGNOSIS — M546 Pain in thoracic spine: Secondary | ICD-10-CM | POA: Diagnosis not present

## 2018-09-13 DIAGNOSIS — M62830 Muscle spasm of back: Secondary | ICD-10-CM | POA: Diagnosis not present

## 2018-10-22 ENCOUNTER — Encounter: Payer: Self-pay | Admitting: Psychiatry

## 2018-10-22 ENCOUNTER — Ambulatory Visit: Payer: PPO | Admitting: Psychiatry

## 2018-10-22 ENCOUNTER — Other Ambulatory Visit: Payer: Self-pay

## 2018-10-22 VITALS — BP 130/77 | HR 108 | Temp 98.3°F | Wt 257.0 lb

## 2018-10-22 DIAGNOSIS — F39 Unspecified mood [affective] disorder: Secondary | ICD-10-CM | POA: Diagnosis not present

## 2018-10-22 DIAGNOSIS — F4010 Social phobia, unspecified: Secondary | ICD-10-CM | POA: Diagnosis not present

## 2018-10-22 MED ORDER — TOPIRAMATE 50 MG PO TABS: 150.0000 mg | ORAL_TABLET | Freq: Every day | ORAL | 2 refills | Status: DC

## 2018-10-22 MED ORDER — ESCITALOPRAM OXALATE 10 MG PO TABS: 10.0000 mg | ORAL_TABLET | Freq: Every day | ORAL | 1 refills | Status: DC

## 2018-10-22 MED ORDER — LITHIUM CARBONATE ER 300 MG PO TBCR: 300.0000 mg | EXTENDED_RELEASE_TABLET | Freq: Every morning | ORAL | 1 refills | Status: DC

## 2018-10-22 MED ORDER — ZALEPLON 10 MG PO CAPS: 10.0000 mg | ORAL_CAPSULE | Freq: Every evening | ORAL | 2 refills | Status: DC | PRN
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
## 2018-12-24 ENCOUNTER — Ambulatory Visit: Payer: PPO | Admitting: Psychiatry

## 2018-12-24 ENCOUNTER — Encounter: Payer: Self-pay | Admitting: Psychiatry

## 2018-12-24 ENCOUNTER — Other Ambulatory Visit: Payer: Self-pay

## 2018-12-24 VITALS — BP 140/82 | HR 111 | Wt 258.2 lb

## 2018-12-24 DIAGNOSIS — F39 Unspecified mood [affective] disorder: Secondary | ICD-10-CM | POA: Diagnosis not present

## 2018-12-24 MED ORDER — TOPIRAMATE 50 MG PO TABS: 150.0000 mg | ORAL_TABLET | Freq: Every day | ORAL | 2 refills | Status: DC

## 2018-12-24 MED ORDER — ZALEPLON 10 MG PO CAPS: 10.0000 mg | ORAL_CAPSULE | Freq: Every evening | ORAL | 2 refills | Status: DC | PRN

## 2018-12-24 MED ORDER — LITHIUM CARBONATE ER 300 MG PO TBCR: 300.0000 mg | EXTENDED_RELEASE_TABLET | Freq: Every morning | ORAL | 1 refills | Status: DC

## 2018-12-24 MED ORDER — ESCITALOPRAM OXALATE 10 MG PO TABS: 10.0000 mg | ORAL_TABLET | Freq: Every day | ORAL | 1 refills | Status: DC
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
## 2019-01-01 DIAGNOSIS — G4733 Obstructive sleep apnea (adult) (pediatric): Secondary | ICD-10-CM | POA: Diagnosis not present

## 2019-01-01 DIAGNOSIS — E669 Obesity, unspecified: Secondary | ICD-10-CM | POA: Diagnosis not present

## 2019-01-10 DIAGNOSIS — I10 Essential (primary) hypertension: Secondary | ICD-10-CM | POA: Diagnosis not present

## 2019-01-18 DIAGNOSIS — Z0000 Encounter for general adult medical examination without abnormal findings: Secondary | ICD-10-CM | POA: Diagnosis not present

## 2019-01-18 DIAGNOSIS — R10819 Abdominal tenderness, unspecified site: Secondary | ICD-10-CM | POA: Diagnosis not present

## 2019-01-18 DIAGNOSIS — I10 Essential (primary) hypertension: Secondary | ICD-10-CM | POA: Diagnosis not present

## 2019-01-18 DIAGNOSIS — Z125 Encounter for screening for malignant neoplasm of prostate: Secondary | ICD-10-CM | POA: Diagnosis not present

## 2019-01-18 DIAGNOSIS — F332 Major depressive disorder, recurrent severe without psychotic features: Secondary | ICD-10-CM | POA: Diagnosis not present

## 2019-01-18 DIAGNOSIS — E782 Mixed hyperlipidemia: Secondary | ICD-10-CM | POA: Diagnosis not present

## 2019-01-18 DIAGNOSIS — R361 Hematospermia: Secondary | ICD-10-CM | POA: Diagnosis not present

## 2019-01-18 DIAGNOSIS — G4733 Obstructive sleep apnea (adult) (pediatric): Secondary | ICD-10-CM | POA: Diagnosis not present

## 2019-01-18 DIAGNOSIS — K760 Fatty (change of) liver, not elsewhere classified: Secondary | ICD-10-CM | POA: Diagnosis not present

## 2019-01-25 DIAGNOSIS — K219 Gastro-esophageal reflux disease without esophagitis: Secondary | ICD-10-CM | POA: Diagnosis not present

## 2019-01-25 DIAGNOSIS — G4733 Obstructive sleep apnea (adult) (pediatric): Secondary | ICD-10-CM | POA: Diagnosis not present

## 2019-01-25 DIAGNOSIS — F341 Dysthymic disorder: Secondary | ICD-10-CM | POA: Diagnosis not present

## 2019-01-25 DIAGNOSIS — E669 Obesity, unspecified: Secondary | ICD-10-CM | POA: Diagnosis not present

## 2019-02-25 ENCOUNTER — Ambulatory Visit: Payer: PPO | Admitting: Urology

## 2019-03-11 DIAGNOSIS — J329 Chronic sinusitis, unspecified: Secondary | ICD-10-CM | POA: Diagnosis not present

## 2019-03-25 ENCOUNTER — Ambulatory Visit: Payer: PPO | Admitting: Psychiatry

## 2019-03-25 ENCOUNTER — Other Ambulatory Visit: Payer: Self-pay

## 2019-04-04 ENCOUNTER — Telehealth: Payer: Self-pay | Admitting: Urology

## 2019-04-04 NOTE — Telephone Encounter (Signed)
Patient has not been seen here should contact PCP and have a referral placed

## 2019-04-04 NOTE — Telephone Encounter (Signed)
Pt's wife called and states that when the pt ejaculates it is blood instead of semen. Pt also has pelvic pain. Please advise.

## 2019-04-11 ENCOUNTER — Ambulatory Visit: Payer: PPO | Admitting: Urology

## 2019-04-25 ENCOUNTER — Other Ambulatory Visit: Payer: Self-pay | Admitting: Psychiatry

## 2019-04-29 ENCOUNTER — Other Ambulatory Visit: Payer: Self-pay

## 2019-04-29 ENCOUNTER — Ambulatory Visit (INDEPENDENT_AMBULATORY_CARE_PROVIDER_SITE_OTHER): Payer: PPO | Admitting: Psychiatry

## 2019-04-29 DIAGNOSIS — F39 Unspecified mood [affective] disorder: Secondary | ICD-10-CM

## 2019-04-29 DIAGNOSIS — F4010 Social phobia, unspecified: Secondary | ICD-10-CM

## 2019-04-29 MED ORDER — TOPIRAMATE 50 MG PO TABS: 150.0000 mg | ORAL_TABLET | Freq: Every day | ORAL | 2 refills | Status: DC

## 2019-04-29 MED ORDER — LITHIUM CARBONATE ER 300 MG PO TBCR: 300.0000 mg | EXTENDED_RELEASE_TABLET | Freq: Every morning | ORAL | 1 refills | Status: DC

## 2019-04-29 MED ORDER — ZALEPLON 10 MG PO CAPS: 10.0000 mg | ORAL_CAPSULE | Freq: Every evening | ORAL | 2 refills | Status: DC | PRN

## 2019-04-29 MED ORDER — ESCITALOPRAM OXALATE 10 MG PO TABS: 10.0000 mg | ORAL_TABLET | Freq: Every day | ORAL | 1 refills | Status: DC

## 2019-04-29 NOTE — Telephone Encounter (Signed)
Pt need to make appt for med refill

## 2019-04-29 NOTE — Progress Notes (Signed)
Patient ID: Mario Knapp, male   DOB: 29-Jan-1946, 73 y.o.   MRN: [id number]    Patient is a 73 year old male with history of depression who was followed up for medication adjustment. He reported that he has been staying at home and trying to do some gardening and keeping himself busy as he is getting tired due to Covid. He reported that he does not have any acute symptoms. He sleeps well with the help of Sonata. He reported that the medications are helping him. He has been trying to lose weight. He stated that he does not have any other acute symptoms at this time. He reported that he is spending more time with his grandchildren who are 1 and two years old.  Medication compliant. Denied using any drugs or alcohol at this time.  Plan Continue medications at this time. I called prescription of Sonata at his local pharmacy He will follow up in two months earlier depending on his symptoms I discussed the assessment and treatment plan with the patient. The patient was provided an opportunity to ask questions and all were answered. The patient agreed with the plan and demonstrated an understanding of the instructions.   The patient was advised to call back or seek an in-person evaluation if the symptoms worsen or if the condition fails to improve as anticipated.   I provided 15 minutes of non-face-to-face time during this encounter.

## 2019-05-21 DIAGNOSIS — E669 Obesity, unspecified: Secondary | ICD-10-CM | POA: Diagnosis not present

## 2019-05-21 DIAGNOSIS — G4733 Obstructive sleep apnea (adult) (pediatric): Secondary | ICD-10-CM | POA: Diagnosis not present

## 2019-06-04 ENCOUNTER — Encounter: Payer: Self-pay | Admitting: Urology

## 2019-06-04 ENCOUNTER — Other Ambulatory Visit: Payer: Self-pay

## 2019-06-04 ENCOUNTER — Ambulatory Visit (INDEPENDENT_AMBULATORY_CARE_PROVIDER_SITE_OTHER): Payer: PPO | Admitting: Urology

## 2019-06-04 VITALS — BP 124/68 | HR 76 | Ht 70.0 in | Wt 271.5 lb

## 2019-06-04 DIAGNOSIS — R361 Hematospermia: Secondary | ICD-10-CM | POA: Diagnosis not present

## 2019-06-04 DIAGNOSIS — R350 Frequency of micturition: Secondary | ICD-10-CM | POA: Diagnosis not present

## 2019-06-04 DIAGNOSIS — N401 Enlarged prostate with lower urinary tract symptoms: Secondary | ICD-10-CM | POA: Diagnosis not present

## 2019-06-04 LAB — URINALYSIS, COMPLETE
Bilirubin, UA: NEGATIVE
Glucose, UA: NEGATIVE
Ketones, UA: NEGATIVE
Leukocytes,UA: NEGATIVE
Nitrite, UA: NEGATIVE
Protein,UA: NEGATIVE
RBC, UA: NEGATIVE
Specific Gravity, UA: 1.02 (ref 1.005–1.030)
Urobilinogen, Ur: 0.2 mg/dL (ref 0.2–1.0)
pH, UA: 5 (ref 5.0–7.5)

## 2019-06-04 LAB — MICROSCOPIC EXAMINATION
Bacteria, UA: NONE SEEN
RBC, Urine: NONE SEEN /hpf (ref 0–2)

## 2019-06-04 NOTE — Progress Notes (Signed)
06/04/2019 9:35 AM   Mario Knapp [birth year] [id number]  Referring provider: Leonel Ramsay, MD [address] [place],  [place] [id number]  Chief Complaint  Patient presents with  . Establish Care    HPI: Mario Knapp is a 73 y.o. male seen for evaluation of hematospermia.  He states 6-8 months ago his semen consistently had streaks of blood and 2 months ago he states his ejaculate was mostly blood.  His ejaculation frequency was 1-2 times per months.  He has ED and has not ejaculated in over 2 months.  He denied pain with ejaculation.  He has occasional left scrotal and suprapubic pain which is mild.  He has mild lower urinary tract symptoms of urinary frequency and urgency however his voiding symptoms are not bothersome.  He is on tamsulosin 0.4 mg daily.    He had a PSA performed by his PCP and March 2020 which was 3.77.  He denies gross hematuria.  Denies prior history of urologic problems or prior urologic evaluation.   PMH: Past Medical History:  Diagnosis Date  . Arthritis   . Depression   . Sleep apnea     Surgical History: Past Surgical History:  Procedure Laterality Date  . BALLOON DILATION N/A 05/11/2016   Procedure: BALLOON DILATION;  Surgeon: Manya Silvas, MD;  Location: [facility] ENDOSCOPY;  Service: Endoscopy;  Laterality: N/A;  . CHOLECYSTECTOMY    . ESOPHAGOGASTRODUODENOSCOPY N/A 04/15/2016   Procedure: ESOPHAGOGASTRODUODENOSCOPY (EGD) with removal of foreign bodu;  Surgeon: Lucilla Lame, MD;  Location: [facility] ENDOSCOPY;  Service: Endoscopy;  Laterality: N/A;  . ESOPHAGOGASTRODUODENOSCOPY (EGD) WITH PROPOFOL N/A 05/11/2016   Procedure: ESOPHAGOGASTRODUODENOSCOPY (EGD) WITH PROPOFOL;  Surgeon: Manya Silvas, MD;  Location[facility] ENDOSCOPY;  Service: Endoscopy;  Laterality: N/A;    Home Medications:  Allergies as of 06/04/2019   No Known Allergies     Medication List       Accurate as of June 04, 2019  9:35 AM. If you have any questions,  ask your nurse or doctor.        escitalopram 10 MG tablet Commonly known as: LEXAPRO Take 1 tablet (10 mg total) by mouth daily.   gabapentin 300 MG capsule Commonly known as: NEURONTIN   ibuprofen 200 MG tablet Commonly known as: ADVIL Take 400 mg by mouth at bedtime.   lithium carbonate 300 MG CR tablet Commonly known as: LITHOBID Take 1 tablet (300 mg total) by mouth every morning.   Multi-Vitamins Tabs Take by mouth.   multivitamin with minerals Tabs tablet Take 1 tablet by mouth daily.   pantoprazole 40 MG tablet Commonly known as: PROTONIX TAKE 1 TABLET BY MOUTH EVERY DAY   tamsulosin 0.4 MG Caps capsule Commonly known as: FLOMAX Take 0.4 mg by mouth daily after supper.   topiramate 50 MG tablet Commonly known as: TOPAMAX Take 3 tablets (150 mg total) by mouth daily.   vitamin B-12 1000 MCG tablet Commonly known as: CYANOCOBALAMIN Take 1,000 mcg by mouth daily.   zaleplon 10 MG capsule Commonly known as: SONATA Take 1 capsule (10 mg total) by mouth at bedtime as needed for sleep.       Allergies: No Known Allergies  Family History: Family History  Problem Relation Age of Onset  . Depression Mother   . Thyroid disease Mother   . Alcohol abuse Father   . Emphysema Father   . Breast cancer Sister        two times  .  Depression Sister   . Alcohol abuse Brother   . Depression Sister     Social History:  reports that he quit smoking about 38 years ago. His smoking use included cigarettes. He quit smokeless tobacco use about 38 years ago. He reports that he does not drink alcohol or use drugs.  ROS: UROLOGY Frequent Urination?: Yes Hard to postpone urination?: Yes Burning/pain with urination?: No Get up at night to urinate?: Yes Leakage of urine?: No Urine stream starts and stops?: No Trouble starting stream?: No Do you have to strain to urinate?: No Blood in urine?: No Urinary tract infection?: No Sexually transmitted disease?: No Injury  to kidneys or bladder?: No Painful intercourse?: No Weak stream?: No Erection problems?: No Penile pain?: No  Gastrointestinal Nausea?: No Vomiting?: No Indigestion/heartburn?: No Diarrhea?: No Constipation?: No  Constitutional Fever: No Night sweats?: No Weight loss?: No Fatigue?: No  Skin Skin rash/lesions?: No Itching?: No  Eyes Blurred vision?: No Double vision?: No  Ears/Nose/Throat Sore throat?: No Sinus problems?: No  Hematologic/Lymphatic Swollen glands?: No Easy bruising?: No  Cardiovascular Leg swelling?: No Chest pain?: No  Respiratory Cough?: No Shortness of breath?: No  Endocrine Excessive thirst?: No  Musculoskeletal Back pain?: No Joint pain?: No  Neurological Headaches?: No Dizziness?: No  Psychologic Depression?: No Anxiety?: No  Physical Exam: BP 124/68 (BP Location: Left Arm, Patient Position: Sitting, Cuff Size: Large)   Pulse 76   Ht 5\' 10"  (1.778 m)   Wt 271 lb 8 oz (123.2 kg)   BMI 38.96 kg/m   Constitutional:  Alert and oriented, No acute distress. HEENT: [place] AT, moist mucus membranes.  Trachea midline, no masses. Cardiovascular: No clubbing, cyanosis, or edema. Respiratory: Normal respiratory effort, no increased work of breathing. GI: Abdomen is soft, nontender, nondistended, no abdominal masses GU: No CVA tenderness.  Penis uncircumcised without lesions, testes descended bilaterally without masses or tenderness.  Cord/epididymis palpably normal bilaterally.  Prostate 40 g, smooth without nodules. Lymph: No cervical or inguinal lymphadenopathy. Skin: No rashes, bruises or suspicious lesions. Neurologic: Grossly intact, no focal deficits, moving all 4 extremities. Psychiatric: Normal mood and affect.   Assessment & Plan:    1. Hematospermia I discussed with [name] that blood in the semen is common and generally not a serious condition.  His DRE is benign and recent PSA normal.  Since he did have significant  blood in the ejaculate 2 months ago on several occasions I recommended scheduling cystoscopy and CT pelvis.  He desires to proceed with further evaluation.  Urinalysis was unremarkable.  Riki AltesScott C Casey Maxfield, MD  [facility] [address], [address] [place], [place] [address] [phone]

## 2019-06-17 DIAGNOSIS — H93299 Other abnormal auditory perceptions, unspecified ear: Secondary | ICD-10-CM | POA: Diagnosis not present

## 2019-06-17 DIAGNOSIS — H6123 Impacted cerumen, bilateral: Secondary | ICD-10-CM | POA: Diagnosis not present

## 2019-06-27 ENCOUNTER — Other Ambulatory Visit (HOSPITAL_COMMUNITY): Payer: Self-pay | Admitting: Nurse Practitioner

## 2019-06-27 ENCOUNTER — Other Ambulatory Visit: Payer: Self-pay | Admitting: Nurse Practitioner

## 2019-06-27 DIAGNOSIS — K760 Fatty (change of) liver, not elsewhere classified: Secondary | ICD-10-CM | POA: Diagnosis not present

## 2019-06-27 DIAGNOSIS — K219 Gastro-esophageal reflux disease without esophagitis: Secondary | ICD-10-CM | POA: Diagnosis not present

## 2019-06-27 DIAGNOSIS — Z79899 Other long term (current) drug therapy: Secondary | ICD-10-CM | POA: Diagnosis not present

## 2019-07-01 ENCOUNTER — Ambulatory Visit
Admission: RE | Admit: 2019-07-01 | Discharge: 2019-07-01 | Disposition: A | Discharge status: Home / Self Care | Payer: PPO | Source: Ambulatory Visit | Attending: Urology | Admitting: Urology

## 2019-07-01 ENCOUNTER — Other Ambulatory Visit: Payer: Self-pay

## 2019-07-01 DIAGNOSIS — R361 Hematospermia: Secondary | ICD-10-CM | POA: Diagnosis not present

## 2019-07-01 DIAGNOSIS — N400 Enlarged prostate without lower urinary tract symptoms: Secondary | ICD-10-CM | POA: Diagnosis not present

## 2019-07-01 HISTORY — DX: Essential (primary) hypertension: I10

## 2019-07-01 MED ORDER — IOHEXOL 300 MG/ML  SOLN
100.0000 mL | Freq: Once | INTRAMUSCULAR | Status: AC | PRN
  Administered 2019-07-01: 100 mL via INTRAVENOUS

## 2019-07-03 ENCOUNTER — Other Ambulatory Visit: Payer: PPO

## 2019-07-04 ENCOUNTER — Ambulatory Visit (INDEPENDENT_AMBULATORY_CARE_PROVIDER_SITE_OTHER): Payer: PPO | Admitting: Urology

## 2019-07-04 ENCOUNTER — Encounter: Payer: Self-pay | Admitting: Urology

## 2019-07-04 ENCOUNTER — Other Ambulatory Visit: Payer: Self-pay

## 2019-07-04 VITALS — BP 148/85 | HR 99 | Ht 70.0 in | Wt 271.0 lb

## 2019-07-04 DIAGNOSIS — N401 Enlarged prostate with lower urinary tract symptoms: Secondary | ICD-10-CM

## 2019-07-04 DIAGNOSIS — R361 Hematospermia: Secondary | ICD-10-CM | POA: Diagnosis not present

## 2019-07-04 DIAGNOSIS — R350 Frequency of micturition: Secondary | ICD-10-CM | POA: Diagnosis not present

## 2019-07-04 LAB — URINALYSIS, COMPLETE
Bilirubin, UA: NEGATIVE
Glucose, UA: NEGATIVE
Ketones, UA: NEGATIVE
Leukocytes,UA: NEGATIVE
Nitrite, UA: NEGATIVE
Protein,UA: NEGATIVE
Specific Gravity, UA: 1.015 (ref 1.005–1.030)
Urobilinogen, Ur: 0.2 mg/dL (ref 0.2–1.0)
pH, UA: 7 (ref 5.0–7.5)

## 2019-07-04 LAB — MICROSCOPIC EXAMINATION: Bacteria, UA: NONE SEEN

## 2019-07-04 MED ORDER — LIDOCAINE HCL URETHRAL/MUCOSAL 2 % EX GEL
1.0000 "application " | Freq: Once | CUTANEOUS | Status: AC
  Administered 2019-07-04: 1 via URETHRAL

## 2019-07-04 MED ORDER — FINASTERIDE 5 MG PO TABS: 5.0000 mg | ORAL_TABLET | Freq: Every day | ORAL | 11 refills | Status: DC

## 2019-07-04 NOTE — Progress Notes (Signed)
   07/04/19  CC:  Chief Complaint  Patient presents with  . Cysto    HPI: Seen 06/04/2019 for hematospermia.  Since his last visit he denies recurrent symptoms.  Pelvic CT showed normal-appearing small vesicles and prostate enlargement estimated at 96 cc.  He is on tamsulosin.  There were no vitals taken for this visit. NED. A&Ox3.   No respiratory distress   Abd soft, NT, ND Normal phallus with bilateral descended testicles  Cystoscopy Procedure Note  Patient identification was confirmed, informed consent was obtained, and patient was prepped using Betadine solution.  Lidocaine jelly was administered per urethral meatus.     Pre-Procedure: - Inspection reveals a normal caliber urethral meatus.  Procedure: The flexible cystoscope was introduced without difficulty - No urethral strictures/lesions are present. - Coapting lateral lobes prostate, long prostatic urethra, prominent hypervascularity/friability - Elevated bladder neck - Bilateral ureteral orifices identified - Bladder mucosa  reveals no ulcers, tumors, or lesions - No bladder stones - Mild trabeculation  Retroflexion shows no intravesical median lobe   Post-Procedure: - Patient tolerated the procedure well  Assessment/ Plan: No abnormalities on pelvic imaging.  Significant BPH which is the most likely source of his hematospermia. We discussed finasteride and he would like to start.  Rx sent to pharmacy.  Follow-up 6 months   Abbie Sons, MD

## 2019-07-16 DIAGNOSIS — E782 Mixed hyperlipidemia: Secondary | ICD-10-CM | POA: Diagnosis not present

## 2019-07-16 DIAGNOSIS — I10 Essential (primary) hypertension: Secondary | ICD-10-CM | POA: Diagnosis not present

## 2019-07-20 ENCOUNTER — Other Ambulatory Visit: Payer: Self-pay | Admitting: Psychiatry

## 2019-07-23 DIAGNOSIS — E782 Mixed hyperlipidemia: Secondary | ICD-10-CM | POA: Diagnosis not present

## 2019-07-23 DIAGNOSIS — F332 Major depressive disorder, recurrent severe without psychotic features: Secondary | ICD-10-CM | POA: Diagnosis not present

## 2019-07-23 DIAGNOSIS — K760 Fatty (change of) liver, not elsewhere classified: Secondary | ICD-10-CM | POA: Diagnosis not present

## 2019-07-23 DIAGNOSIS — R0602 Shortness of breath: Secondary | ICD-10-CM | POA: Diagnosis not present

## 2019-07-23 DIAGNOSIS — G4733 Obstructive sleep apnea (adult) (pediatric): Secondary | ICD-10-CM | POA: Diagnosis not present

## 2019-07-23 DIAGNOSIS — E538 Deficiency of other specified B group vitamins: Secondary | ICD-10-CM | POA: Diagnosis not present

## 2019-07-23 DIAGNOSIS — I10 Essential (primary) hypertension: Secondary | ICD-10-CM | POA: Diagnosis not present

## 2019-07-23 DIAGNOSIS — Z0001 Encounter for general adult medical examination with abnormal findings: Secondary | ICD-10-CM | POA: Diagnosis not present

## 2019-07-29 ENCOUNTER — Other Ambulatory Visit: Payer: Self-pay

## 2019-07-29 ENCOUNTER — Ambulatory Visit (INDEPENDENT_AMBULATORY_CARE_PROVIDER_SITE_OTHER): Payer: PPO | Admitting: Psychiatry

## 2019-07-29 ENCOUNTER — Encounter: Payer: Self-pay | Admitting: Psychiatry

## 2019-07-29 DIAGNOSIS — F4010 Social phobia, unspecified: Secondary | ICD-10-CM | POA: Diagnosis not present

## 2019-07-29 DIAGNOSIS — F39 Unspecified mood [affective] disorder: Secondary | ICD-10-CM

## 2019-07-29 MED ORDER — ZALEPLON 10 MG PO CAPS: 10.0000 mg | ORAL_CAPSULE | Freq: Every evening | ORAL | 2 refills | Status: DC | PRN

## 2019-07-29 MED ORDER — ESCITALOPRAM OXALATE 10 MG PO TABS: 10.0000 mg | ORAL_TABLET | Freq: Every day | ORAL | 1 refills | Status: DC

## 2019-07-29 MED ORDER — TOPIRAMATE 50 MG PO TABS: 150.0000 mg | ORAL_TABLET | Freq: Every day | ORAL | 2 refills | Status: DC

## 2019-07-29 MED ORDER — LITHIUM CARBONATE ER 300 MG PO TBCR: 300.0000 mg | EXTENDED_RELEASE_TABLET | Freq: Every morning | ORAL | 1 refills | Status: DC

## 2019-07-29 NOTE — Progress Notes (Signed)
Patient ID: Mario Knapp, male   DOB: 1946/07/16, 73 y.o.   MRN: [id number]   Patient is a 73 year old male with history of Mode and anxiety disorder who was followed of for medication management. He reported that he has beenTaking his medications. He reported that he is having problems asleep and continues to take Sonata on a regular basis. He reported that he wants a refill on his medications including lithium Topamax. His wife he stated that he has some shaking in his hands and they want to do the lithium level. He will follow up with his primary care physician about the lithium level. Patient appeared calm during the interview. He reported that he has recently done his physical exam at the PCP and no other acute symptoms noted at this time. He was able to contract for safety at this time.  Plan I will refill his medications. Patient will get his lithium level at the PCP Call in the refill of Sonata at the local pharmacy with refills for the next three months He will follow-up in three months earlier depending on his symptoms.   I connected with patient via telemedicine application and verified that I am speaking with the correct person using two identifiers.  I discussed the limitations of evaluation and management by telemedicine and the availability of in person appointments. The patient expressed understanding and agreed to proceed.   I discussed the assessment and treatment plan with the patient. The patient was provided an opportunity to ask questions and all were answered. The patient agreed with the plan and demonstrated an understanding of the instructions.   The patient was advised to call back or seek an in-person evaluation if the symptoms worsen or if the condition fails to improve as anticipated.   I provided 15 minutes of non-face-to-face time during this encounter.

## 2019-08-09 DIAGNOSIS — G4733 Obstructive sleep apnea (adult) (pediatric): Secondary | ICD-10-CM | POA: Diagnosis not present

## 2019-08-09 DIAGNOSIS — K760 Fatty (change of) liver, not elsewhere classified: Secondary | ICD-10-CM | POA: Diagnosis not present

## 2019-08-09 DIAGNOSIS — E782 Mixed hyperlipidemia: Secondary | ICD-10-CM | POA: Diagnosis not present

## 2019-08-09 DIAGNOSIS — E6609 Other obesity due to excess calories: Secondary | ICD-10-CM | POA: Diagnosis not present

## 2019-08-09 DIAGNOSIS — Z6837 Body mass index (BMI) 37.0-37.9, adult: Secondary | ICD-10-CM | POA: Diagnosis not present

## 2019-08-09 DIAGNOSIS — R0602 Shortness of breath: Secondary | ICD-10-CM | POA: Diagnosis not present

## 2019-08-09 DIAGNOSIS — I10 Essential (primary) hypertension: Secondary | ICD-10-CM | POA: Diagnosis not present

## 2019-08-09 DIAGNOSIS — R0600 Dyspnea, unspecified: Secondary | ICD-10-CM | POA: Diagnosis not present

## 2019-08-21 DIAGNOSIS — E669 Obesity, unspecified: Secondary | ICD-10-CM | POA: Diagnosis not present

## 2019-08-21 DIAGNOSIS — G4733 Obstructive sleep apnea (adult) (pediatric): Secondary | ICD-10-CM | POA: Diagnosis not present

## 2019-08-26 DIAGNOSIS — R0600 Dyspnea, unspecified: Secondary | ICD-10-CM | POA: Diagnosis not present

## 2019-08-26 DIAGNOSIS — R0602 Shortness of breath: Secondary | ICD-10-CM | POA: Diagnosis not present

## 2019-09-03 DIAGNOSIS — G4733 Obstructive sleep apnea (adult) (pediatric): Secondary | ICD-10-CM | POA: Diagnosis not present

## 2019-09-03 DIAGNOSIS — E6609 Other obesity due to excess calories: Secondary | ICD-10-CM | POA: Diagnosis not present

## 2019-09-03 DIAGNOSIS — E782 Mixed hyperlipidemia: Secondary | ICD-10-CM | POA: Diagnosis not present

## 2019-09-03 DIAGNOSIS — Z6837 Body mass index (BMI) 37.0-37.9, adult: Secondary | ICD-10-CM | POA: Diagnosis not present

## 2019-09-03 DIAGNOSIS — I10 Essential (primary) hypertension: Secondary | ICD-10-CM | POA: Diagnosis not present

## 2019-10-24 ENCOUNTER — Other Ambulatory Visit: Payer: Self-pay | Admitting: Psychiatry

## 2019-10-28 ENCOUNTER — Ambulatory Visit: Payer: PPO | Admitting: Psychiatry

## 2019-10-29 ENCOUNTER — Telehealth: Payer: Self-pay | Admitting: Psychiatry

## 2019-10-29 DIAGNOSIS — F39 Unspecified mood [affective] disorder: Secondary | ICD-10-CM

## 2019-10-29 DIAGNOSIS — G4700 Insomnia, unspecified: Secondary | ICD-10-CM

## 2019-10-29 MED ORDER — ZALEPLON 10 MG PO CAPS: 10.0000 mg | ORAL_CAPSULE | Freq: Every evening | ORAL | 0 refills | Status: DC | PRN

## 2019-10-29 NOTE — Telephone Encounter (Signed)
I have sent Sonata 10 mg for 7-day supply to his pharmacy per request. Patient has appointment with Dr. Toy Care on  11/04/2019

## 2019-11-04 ENCOUNTER — Ambulatory Visit (INDEPENDENT_AMBULATORY_CARE_PROVIDER_SITE_OTHER): Payer: PPO | Admitting: Psychiatry

## 2019-11-04 ENCOUNTER — Other Ambulatory Visit: Payer: Self-pay

## 2019-11-04 ENCOUNTER — Encounter: Payer: Self-pay | Admitting: Psychiatry

## 2019-11-04 DIAGNOSIS — F4010 Social phobia, unspecified: Secondary | ICD-10-CM

## 2019-11-04 DIAGNOSIS — G4700 Insomnia, unspecified: Secondary | ICD-10-CM | POA: Diagnosis not present

## 2019-11-04 DIAGNOSIS — F39 Unspecified mood [affective] disorder: Secondary | ICD-10-CM | POA: Diagnosis not present

## 2019-11-04 MED ORDER — LITHIUM CARBONATE ER 300 MG PO TBCR: 300.0000 mg | EXTENDED_RELEASE_TABLET | Freq: Every morning | ORAL | 1 refills | Status: DC

## 2019-11-04 MED ORDER — ESCITALOPRAM OXALATE 10 MG PO TABS: 10.0000 mg | ORAL_TABLET | Freq: Every day | ORAL | 1 refills | Status: DC

## 2019-11-04 MED ORDER — ZALEPLON 10 MG PO CAPS: 10.0000 mg | ORAL_CAPSULE | Freq: Every evening | ORAL | 2 refills | Status: DC | PRN

## 2019-11-04 MED ORDER — TOPIRAMATE 50 MG PO TABS: 150.0000 mg | ORAL_TABLET | Freq: Every day | ORAL | 1 refills | Status: DC

## 2019-11-04 NOTE — Progress Notes (Signed)
BH MD OP Progress Note  I connected with  Mario Knapp on 11/04/19 by phone and verified that I am speaking with the correct person using two identifiers.   I discussed the limitations of evaluation and management by phone. The patient expressed understanding and agreed to proceed.    11/04/2019 2:17 PM Mario Knapp  MRN:  [id number]  Chief Complaint: " I am doing fine."  HPI: Patient reported doing well on his current regimen.  He denied any acute stressors or concerns.  He stated he has been sleeping fine with the help of the medicine.  He denied any side effects.  He said he had a good holiday weekend with his family.  He recently requested refills for all his medications.  He has been compliant with all the medications including lithium.  Visit Diagnosis:    ICD-10-CM   1. Episodic mood disorder (HCC)  F39   2. Social anxiety disorder  F40.10     Past Psychiatric History: mood disorder, anxiety  Past Medical History:  Past Medical History:  Diagnosis Date  . Arthritis   . Depression   . Hypertension   . Sleep apnea     Past Surgical History:  Procedure Laterality Date  . BALLOON DILATION N/A 05/11/2016   Procedure: BALLOON DILATION;  Surgeon: Scot Junobert T Elliott, MD;  [facility];  Service: Endoscopy;  Laterality: N/A;  . CHOLECYSTECTOMY    . ESOPHAGOGASTRODUODENOSCOPY N/A 04/15/2016   Procedure: ESOPHAGOGASTRODUODENOSCOPY (EGD) with removal of foreign bodu;  Surgeon: Midge Miniumarren Wohl, MD;  Location: [facility] ENDOSCOPY;  Service: Endoscopy;  Laterality: N/A;  . ESOPHAGOGASTRODUODENOSCOPY (EGD) WITH PROPOFOL N/A 05/11/2016   Procedure: ESOPHAGOGASTRODUODENOSCOPY (EGD) WITH PROPOFOL;  Surgeon: Scot Junobert T Elliott, MD;  [facility];  Service: Endoscopy;  Laterality: N/A;    Family Psychiatric History: see below  Family History:  Family History  Problem Relation Age of Onset  . Depression Mother   . Thyroid disease Mother   . Alcohol abuse Father   .  Emphysema Father   . Breast cancer Sister        two times  . Depression Sister   . Alcohol abuse Brother   . Depression Sister     Social History:  Social History   Socioeconomic History  . Marital status: Married    Spouse name: peggy   . Number of children: 4  . Years of education: Not on file  . Highest education level: 9th grade  Occupational History  . Occupation: retired  Tobacco Use  . Smoking status: Former Smoker    Types: Cigarettes    Quit date: 02/25/1981    Years since quitting: 38.7  . Smokeless tobacco: Former NeurosurgeonUser    Quit date: 02/25/1981  Substance and Sexual Activity  . Alcohol use: No  . Drug use: No  . Sexual activity: Yes  Other Topics Concern  . Not on file  Social History Narrative  . Not on file   Social Determinants of Health   Financial Resource Strain:   . Difficulty of Paying Living Expenses: Not on file  Food Insecurity:   . Worried About Programme researcher, broadcasting/film/videounning Out of Food in the Last Year: Not on file  . Ran Out of Food in the Last Year: Not on file  Transportation Needs:   . Lack of Transportation (Medical): Not on file  . Lack of Transportation (Non-Medical): Not on file  Physical Activity:   . Days of Exercise per Week: Not on file  .  Minutes of Exercise per Session: Not on file  Stress:   . Feeling of Stress : Not on file  Social Connections:   . Frequency of Communication with Friends and Family: Not on file  . Frequency of Social Gatherings with Friends and Family: Not on file  . Attends Religious Services: Not on file  . Active Member of Clubs or Organizations: Not on file  . Attends Banker Meetings: Not on file  . Marital Status: Not on file    Allergies: No Known Allergies  Metabolic Disorder Labs: Lab Results  Component Value Date   HGBA1C 5.4 02/17/2016   No results found for: PROLACTIN Lab Results  Component Value Date   CHOL 213 (H) 06/28/2012   TRIG 227 (H) 06/28/2012   HDL 35 (L) 06/28/2012   VLDL 45  (H) 06/28/2012   LDLCALC 133 (H) 06/28/2012   LDLCALC 118 (H) 12/22/2011   Lab Results  Component Value Date   TSH 2.997 02/17/2016    Therapeutic Level Labs: No results found for: LITHIUM No results found for: VALPROATE No components found for:  CBMZ  Current Medications: Current Outpatient Medications  Medication Sig Dispense Refill  . escitalopram (LEXAPRO) 10 MG tablet Take 1 tablet (10 mg total) by mouth daily. 90 tablet 1  . finasteride (PROSCAR) 5 MG tablet Take 1 tablet (5 mg total) by mouth daily. 30 tablet 11  . gabapentin (NEURONTIN) 300 MG capsule     . ibuprofen (ADVIL,MOTRIN) 200 MG tablet Take 400 mg by mouth at bedtime.    Marland Kitchen lithium carbonate (LITHOBID) 300 MG CR tablet Take 1 tablet (300 mg total) by mouth every morning. 90 tablet 1  . Multiple Vitamin (MULTI-VITAMINS) TABS Take by mouth.    . Multiple Vitamin (MULTIVITAMIN WITH MINERALS) TABS tablet Take 1 tablet by mouth daily.    . pantoprazole (PROTONIX) 40 MG tablet TAKE 1 TABLET BY MOUTH EVERY DAY    . tamsulosin (FLOMAX) 0.4 MG CAPS capsule Take 0.4 mg by mouth daily after supper.    . topiramate (TOPAMAX) 50 MG tablet Take 3 tablets (150 mg total) by mouth daily. 90 tablet 2  . vitamin B-12 (CYANOCOBALAMIN) 1000 MCG tablet Take 1,000 mcg by mouth daily.    . zaleplon (SONATA) 10 MG capsule Take 1 capsule (10 mg total) by mouth at bedtime as needed for sleep. 7 capsule 0   No current facility-administered medications for this visit.     Musculoskeletal: Strength & Muscle Tone: unable to assess due to telemed visit Gait & Station: unable to assess due to telemed visit Patient leans: unable to assess due to telemed visit  Psychiatric Specialty Exam: Review of Systems  There were no vitals taken for this visit.There is no height or weight on file to calculate BMI.  General Appearance: unable to assess due to phone visit  Eye Contact:  unable to assess due to phone visit  Speech:  Clear and Coherent and  Normal Rate  Volume:  Normal  Mood:  Euthymic  Affect:  Congruent  Thought Process:  Goal Directed, Linear and Descriptions of Associations: Intact  Orientation:  Full (Time, Place, and Person)  Thought Content: Logical   Suicidal Thoughts:  No  Homicidal Thoughts:  No  Memory:  Recent;   Good Remote;   Good  Judgement:  Fair  Insight:  Fair  Psychomotor Activity:  Normal  Concentration:  Concentration: Good and Attention Span: Good  Recall:  Good  Fund of Knowledge: Good  Language: Good  Akathisia:  Negative  Handed:  Right  AIMS (if indicated): not done  Assets:  Communication Skills Desire for Improvement Financial Resources/Insurance Housing  ADL's:  Intact  Cognition: WNL  Sleep:  Good   Screenings: AIMS     Admission (Discharged) from 02/16/2016 in [place] Total Score  0    AUDIT     Admission (Discharged) from 02/16/2016 in [place]  Alcohol Use Disorder Identification Test Final Score (AUDIT)  0       Assessment and Plan: Patient reported doing well on his current regimen and requests refills.  1. Episodic mood disorder (HCC)  - escitalopram (LEXAPRO) 10 MG tablet; Take 1 tablet (10 mg total) by mouth daily.  Dispense: 90 tablet; Refill: 1 - lithium carbonate (LITHOBID) 300 MG CR tablet; Take 1 tablet (300 mg total) by mouth every morning.  Dispense: 90 tablet; Refill: 1 - zaleplon (SONATA) 10 MG capsule; Take 1 capsule (10 mg total) by mouth at bedtime as needed for sleep.  Dispense: 30 capsule; Refill: 2 - topiramate (TOPAMAX) 50 MG tablet; Take 3 tablets (150 mg total) by mouth daily.  Dispense: 90 tablet; Refill: 1  2. Social anxiety disorder  - escitalopram (LEXAPRO) 10 MG tablet; Take 1 tablet (10 mg total) by mouth daily.  Dispense: 90 tablet; Refill: 1  3. Insomnia, unspecified type  - zaleplon (SONATA) 10 MG capsule; Take 1 capsule (10 mg total) by mouth at bedtime as needed for sleep.   Dispense: 30 capsule; Refill: 2  Continue same medication regimen. Follow up in 3 months.   Nevada Crane, MD 11/04/2019, 2:17 PM

## 2019-12-02 DIAGNOSIS — H2513 Age-related nuclear cataract, bilateral: Secondary | ICD-10-CM | POA: Diagnosis not present

## 2020-01-06 ENCOUNTER — Telehealth: Payer: Self-pay | Admitting: Psychiatry

## 2020-01-06 ENCOUNTER — Ambulatory Visit: Payer: PPO | Admitting: Urology

## 2020-01-06 DIAGNOSIS — F39 Unspecified mood [affective] disorder: Secondary | ICD-10-CM

## 2020-01-06 MED ORDER — TOPIRAMATE 50 MG PO TABS: 150.0000 mg | ORAL_TABLET | Freq: Every day | ORAL | 1 refills | Status: DC

## 2020-01-06 NOTE — Telephone Encounter (Signed)
Prescription sent

## 2020-01-08 ENCOUNTER — Encounter: Payer: Self-pay | Admitting: Urology

## 2020-01-08 ENCOUNTER — Other Ambulatory Visit: Payer: Self-pay

## 2020-01-08 ENCOUNTER — Ambulatory Visit: Payer: PPO | Admitting: Urology

## 2020-01-08 VITALS — BP 148/77 | HR 92 | Ht 70.0 in | Wt 270.0 lb

## 2020-01-08 DIAGNOSIS — N5201 Erectile dysfunction due to arterial insufficiency: Secondary | ICD-10-CM | POA: Diagnosis not present

## 2020-01-08 DIAGNOSIS — R361 Hematospermia: Secondary | ICD-10-CM | POA: Diagnosis not present

## 2020-01-08 DIAGNOSIS — N401 Enlarged prostate with lower urinary tract symptoms: Secondary | ICD-10-CM

## 2020-01-08 NOTE — Progress Notes (Signed)
01/08/2020 2:53 PM   Mario Knapp 18-Aug-1946 [id number]  Referring provider: Gracelyn Nurse, MD [address] [place],  [place] [address]  Chief Complaint  Patient presents with  . Follow-up    HPI: 74 y.o. male presents for follow-up of hematospermia -Initially seen 05/2019 with a 6-71-month history of blood-streaked semen and 57-month history of bloody ejaculate -PSA 3.77 -Pelvic CT 96 g prostate, normal-appearing SV -Cystoscopy BPH with intravesical median lobe, prominent hypervascularity -Started finasteride 06/2019 -Hematospermia still present but not bothersome -No bothersome voiding symptoms -Denies gross hematuria   PMH: Past Medical History:  Diagnosis Date  . Arthritis   . Depression   . Hypertension   . Sleep apnea     Surgical History: Past Surgical History:  Procedure Laterality Date  . BALLOON DILATION N/A 05/11/2016   Procedure: BALLOON DILATION;  Surgeon: Scot Jun, MD;  Location: [facility] ENDOSCOPY;  Service: Endoscopy;  Laterality: N/A;  . CHOLECYSTECTOMY    . ESOPHAGOGASTRODUODENOSCOPY N/A 04/15/2016   Procedure: ESOPHAGOGASTRODUODENOSCOPY (EGD) with removal of foreign bodu;  Surgeon: Midge Minium, MD;  Location: [facility] ENDOSCOPY;  Service: Endoscopy;  Laterality: N/A;  . ESOPHAGOGASTRODUODENOSCOPY (EGD) WITH PROPOFOL N/A 05/11/2016   Procedure: ESOPHAGOGASTRODUODENOSCOPY (EGD) WITH PROPOFOL;  Surgeon: Scot Jun, MD;  Location: [facility] ENDOSCOPY;  Service: Endoscopy;  Laterality: N/A;    Home Medications:  Allergies as of 01/08/2020   No Known Allergies     Medication List       Accurate as of January 08, 2020  2:53 PM. If you have any questions, ask your nurse or doctor.        escitalopram 10 MG tablet Commonly known as: LEXAPRO Take 1 tablet (10 mg total) by mouth daily.   finasteride 5 MG tablet Commonly known as: PROSCAR Take 1 tablet (5 mg total) by mouth daily.   furosemide 20 MG tablet Commonly known as: LASIX     gabapentin 300 MG capsule Commonly known as: NEURONTIN   ibuprofen 200 MG tablet Commonly known as: ADVIL Take 400 mg by mouth at bedtime.   lithium carbonate 300 MG CR tablet Commonly known as: LITHOBID Take 1 tablet (300 mg total) by mouth every morning.   Multi-Vitamins Tabs Take by mouth.   multivitamin with minerals Tabs tablet Take 1 tablet by mouth daily.   pantoprazole 40 MG tablet Commonly known as: PROTONIX TAKE 1 TABLET BY MOUTH EVERY DAY   tamsulosin 0.4 MG Caps capsule Commonly known as: FLOMAX Take 0.4 mg by mouth daily after supper.   topiramate 50 MG tablet Commonly known as: TOPAMAX Take 3 tablets (150 mg total) by mouth daily.   vitamin B-12 1000 MCG tablet Commonly known as: CYANOCOBALAMIN Take 1,000 mcg by mouth daily.   zaleplon 10 MG capsule Commonly known as: SONATA Take 1 capsule (10 mg total) by mouth at bedtime as needed for sleep.       Allergies: No Known Allergies  Family History: Family History  Problem Relation Age of Onset  . Depression Mother   . Thyroid disease Mother   . Alcohol abuse Father   . Emphysema Father   . Breast cancer Sister        two times  . Depression Sister   . Alcohol abuse Brother   . Depression Sister     Social History:  reports that he quit smoking about 38 years ago. His smoking use included cigarettes. He quit smokeless tobacco use about 38 years ago. He reports that he  does not drink alcohol or use drugs.   Physical Exam: BP (!) 148/77   Pulse 92   Ht 5\' 10"  (1.778 m)   Wt 270 lb (122.5 kg)   BMI 38.74 kg/m   Constitutional:  Alert and oriented, No acute distress. HEENT: [place] AT, moist mucus membranes.  Trachea midline, no masses. Cardiovascular: No clubbing, cyanosis, or edema. Respiratory: Normal respiratory effort, no increased work of breathing. GI: Abdomen is soft, nontender, nondistended, no abdominal masses GU: 50 g, smooth without nodules Skin: No rashes, bruises or suspicious  lesions. Neurologic: Grossly intact, no focal deficits, moving all 4 extremities. Psychiatric: Normal mood and affect.   Assessment & Plan:    - BPH with mild LUTS Continue tamsulosin.  He desires to continue finasteride prophylactically  - Hematospermia Most likely secondary to above.  No improvement on finasteride.  DRE benign.  He will have his PSA drawn with Dr. Edwina Barth this month and would expect a 50% decrease.  - Erectile dysfunction He has failed PDE 5 inhibitors.  He inquired about other options and is not interested in pursuing intracavernosal injections, vacuum erection devices or penile implant surgery.   Abbie Sons, [place] [address], [place], [place] [address] [phone]

## 2020-01-09 ENCOUNTER — Encounter: Payer: Self-pay | Admitting: Urology

## 2020-01-09 DIAGNOSIS — N5201 Erectile dysfunction due to arterial insufficiency: Secondary | ICD-10-CM | POA: Insufficient documentation

## 2020-01-14 DIAGNOSIS — I10 Essential (primary) hypertension: Secondary | ICD-10-CM | POA: Diagnosis not present

## 2020-01-21 DIAGNOSIS — E876 Hypokalemia: Secondary | ICD-10-CM | POA: Diagnosis not present

## 2020-01-21 DIAGNOSIS — F332 Major depressive disorder, recurrent severe without psychotic features: Secondary | ICD-10-CM | POA: Diagnosis not present

## 2020-01-21 DIAGNOSIS — G4733 Obstructive sleep apnea (adult) (pediatric): Secondary | ICD-10-CM | POA: Diagnosis not present

## 2020-01-21 DIAGNOSIS — E782 Mixed hyperlipidemia: Secondary | ICD-10-CM | POA: Diagnosis not present

## 2020-01-21 DIAGNOSIS — I10 Essential (primary) hypertension: Secondary | ICD-10-CM | POA: Diagnosis not present

## 2020-01-21 DIAGNOSIS — K760 Fatty (change of) liver, not elsewhere classified: Secondary | ICD-10-CM | POA: Diagnosis not present

## 2020-01-21 DIAGNOSIS — Z0000 Encounter for general adult medical examination without abnormal findings: Secondary | ICD-10-CM | POA: Diagnosis not present

## 2020-01-21 DIAGNOSIS — E538 Deficiency of other specified B group vitamins: Secondary | ICD-10-CM | POA: Diagnosis not present

## 2020-01-21 DIAGNOSIS — R739 Hyperglycemia, unspecified: Secondary | ICD-10-CM | POA: Diagnosis not present

## 2020-01-27 ENCOUNTER — Encounter: Payer: Self-pay | Admitting: Psychiatry

## 2020-01-27 ENCOUNTER — Other Ambulatory Visit: Payer: Self-pay

## 2020-01-27 ENCOUNTER — Ambulatory Visit (INDEPENDENT_AMBULATORY_CARE_PROVIDER_SITE_OTHER): Payer: PPO | Admitting: Psychiatry

## 2020-01-27 DIAGNOSIS — F4010 Social phobia, unspecified: Secondary | ICD-10-CM | POA: Diagnosis not present

## 2020-01-27 DIAGNOSIS — G4700 Insomnia, unspecified: Secondary | ICD-10-CM

## 2020-01-27 DIAGNOSIS — F39 Unspecified mood [affective] disorder: Secondary | ICD-10-CM

## 2020-01-27 MED ORDER — ESCITALOPRAM OXALATE 10 MG PO TABS: 10.0000 mg | ORAL_TABLET | Freq: Every day | ORAL | 1 refills | Status: DC

## 2020-01-27 MED ORDER — TOPIRAMATE 50 MG PO TABS: 150.0000 mg | ORAL_TABLET | Freq: Every day | ORAL | 1 refills | Status: DC

## 2020-01-27 MED ORDER — LITHIUM CARBONATE ER 300 MG PO TBCR: 300.0000 mg | EXTENDED_RELEASE_TABLET | Freq: Every morning | ORAL | 1 refills | Status: DC

## 2020-01-27 MED ORDER — ZALEPLON 10 MG PO CAPS: 10.0000 mg | ORAL_CAPSULE | Freq: Every evening | ORAL | 2 refills | Status: DC | PRN

## 2020-01-27 NOTE — Progress Notes (Signed)
BH MD OP Progress Note  I connected with  Mario Knapp on 01/27/20 by phone and verified that I am speaking with the correct person using two identifiers.   I discussed the limitations of evaluation and management by phone. The patient expressed understanding and agreed to proceed.    01/27/2020 2:09 PM Mario Knapp  MRN:  [id number]  Chief Complaint: " I am doing fine."  HPI: Patient reported doing well on his current regimen.  He stated that his mood has been stable and he is sleeping well with the help of Sonata.  He recently had routine lab work done in early March for his annual physical.  Lab results were reviewed and his creatinine was noted to be stable at 1.3.  Patient stated that everything is going well for him and he does not want to change anything.  He requested for refills on all his medications.   Visit Diagnosis:    ICD-10-CM   1. Episodic mood disorder (HCC)  F39   2. Social anxiety disorder  F40.10   3. Insomnia, unspecified type  G47.00     Past Psychiatric History: mood disorder, anxiety  Past Medical History:  Past Medical History:  Diagnosis Date  . Arthritis   . Depression   . Hypertension   . Sleep apnea     Past Surgical History:  Procedure Laterality Date  . BALLOON DILATION N/A 05/11/2016   Procedure: BALLOON DILATION;  Surgeon: Scot Jun, MD;  Location: [facility] ENDOSCOPY;  Service: Endoscopy;  Laterality: N/A;  . CHOLECYSTECTOMY    . ESOPHAGOGASTRODUODENOSCOPY N/A 04/15/2016   Procedure: ESOPHAGOGASTRODUODENOSCOPY (EGD) with removal of foreign bodu;  Surgeon: Midge Minium, MD;  Location: [facility] ENDOSCOPY;  Service: Endoscopy;  Laterality: N/A;  . ESOPHAGOGASTRODUODENOSCOPY (EGD) WITH PROPOFOL N/A 05/11/2016   Procedure: ESOPHAGOGASTRODUODENOSCOPY (EGD) WITH PROPOFOL;  Surgeon: Scot Jun, MD;  Location: [facility] ENDOSCOPY;  Service: Endoscopy;  Laterality: N/A;    Family Psychiatric History: see below  Family History:  Family History   Problem Relation Age of Onset  . Depression Mother   . Thyroid disease Mother   . Alcohol abuse Father   . Emphysema Father   . Breast cancer Sister        two times  . Depression Sister   . Alcohol abuse Brother   . Depression Sister     Social History:  Social History   Socioeconomic History  . Marital status: Married    Spouse name: peggy   . Number of children: 4  . Years of education: Not on file  . Highest education level: 9th grade  Occupational History  . Occupation: retired  Tobacco Use  . Smoking status: Former Smoker    Types: Cigarettes    Quit date: 02/25/1981    Years since quitting: 38.9  . Smokeless tobacco: Former Neurosurgeon    Quit date: 02/25/1981  Substance and Sexual Activity  . Alcohol use: No  . Drug use: No  . Sexual activity: Yes  Other Topics Concern  . Not on file  Social History Narrative  . Not on file   Social Determinants of Health   Financial Resource Strain:   . Difficulty of Paying Living Expenses:   Food Insecurity:   . Worried About Programme researcher, broadcasting/film/video in the Last Year:   . Barista in the Last Year:   Transportation Needs:   . Freight forwarder (Medical):   Marland Kitchen Lack of Transportation (Non-Medical):  Physical Activity:   . Days of Exercise per Week:   . Minutes of Exercise per Session:   Stress:   . Feeling of Stress :   Social Connections:   . Frequency of Communication with Friends and Family:   . Frequency of Social Gatherings with Friends and Family:   . Attends Religious Services:   . Active Member of Clubs or Organizations:   . Attends Archivist Meetings:   Marland Kitchen Marital Status:     Allergies: No Known Allergies  Metabolic Disorder Labs: Lab Results  Component Value Date   HGBA1C 5.4 02/17/2016   No results found for: PROLACTIN Lab Results  Component Value Date   CHOL 213 (H) 06/28/2012   TRIG 227 (H) 06/28/2012   HDL 35 (L) 06/28/2012   VLDL 45 (H) 06/28/2012   LDLCALC 133 (H)  06/28/2012   LDLCALC 118 (H) 12/22/2011   Lab Results  Component Value Date   TSH 2.997 02/17/2016    Therapeutic Level Labs: No results found for: LITHIUM No results found for: VALPROATE No components found for:  CBMZ  Current Medications: Current Outpatient Medications  Medication Sig Dispense Refill  . escitalopram (LEXAPRO) 10 MG tablet Take 1 tablet (10 mg total) by mouth daily. 90 tablet 1  . finasteride (PROSCAR) 5 MG tablet Take 1 tablet (5 mg total) by mouth daily. 30 tablet 11  . furosemide (LASIX) 20 MG tablet     . gabapentin (NEURONTIN) 300 MG capsule     . ibuprofen (ADVIL,MOTRIN) 200 MG tablet Take 400 mg by mouth at bedtime.    Marland Kitchen lithium carbonate (LITHOBID) 300 MG CR tablet Take 1 tablet (300 mg total) by mouth every morning. 90 tablet 1  . Multiple Vitamin (MULTI-VITAMINS) TABS Take by mouth.    . Multiple Vitamin (MULTIVITAMIN WITH MINERALS) TABS tablet Take 1 tablet by mouth daily.    . pantoprazole (PROTONIX) 40 MG tablet TAKE 1 TABLET BY MOUTH EVERY DAY    . tamsulosin (FLOMAX) 0.4 MG CAPS capsule Take 0.4 mg by mouth daily after supper.    . topiramate (TOPAMAX) 50 MG tablet Take 3 tablets (150 mg total) by mouth daily. 90 tablet 1  . vitamin B-12 (CYANOCOBALAMIN) 1000 MCG tablet Take 1,000 mcg by mouth daily.    . zaleplon (SONATA) 10 MG capsule Take 1 capsule (10 mg total) by mouth at bedtime as needed for sleep. 30 capsule 2   No current facility-administered medications for this visit.     Musculoskeletal: Strength & Muscle Tone: unable to assess due to telemed visit Gait & Station: unable to assess due to telemed visit Patient leans: unable to assess due to telemed visit  Psychiatric Specialty Exam: Review of Systems  There were no vitals taken for this visit.There is no height or weight on file to calculate BMI.  General Appearance: unable to assess due to phone visit  Eye Contact:  unable to assess due to phone visit  Speech:  Clear and  Coherent and Normal Rate  Volume:  Normal  Mood:  Euthymic  Affect:  Congruent  Thought Process:  Goal Directed, Linear and Descriptions of Associations: Intact  Orientation:  Full (Time, Place, and Person)  Thought Content: Logical   Suicidal Thoughts:  No  Homicidal Thoughts:  No  Memory:  Recent;   Good Remote;   Good  Judgement:  Fair  Insight:  Fair  Psychomotor Activity:  Normal  Concentration:  Concentration: Good and Attention Span: Good  Recall:  Good  Fund of Knowledge: Good  Language: Good  Akathisia:  Negative  Handed:  Right  AIMS (if indicated): not done  Assets:  Communication Skills Desire for Improvement Financial Resources/Insurance Housing  ADL's:  Intact  Cognition: WNL  Sleep:  Good   Screenings: AIMS     Admission (Discharged) from 02/16/2016 in [facility] INPATIENT BEHAVIORAL MEDICINE  AIMS Total Score  0    AUDIT     Admission (Discharged) from 02/16/2016 in [facility] INPATIENT BEHAVIORAL MEDICINE  Alcohol Use Disorder Identification Test Final Score (AUDIT)  0       Assessment and Plan: Patient reported doing well on his current regimen and requested refills.  1. Episodic mood disorder (HCC)  - escitalopram (LEXAPRO) 10 MG tablet; Take 1 tablet (10 mg total) by mouth daily.  Dispense: 90 tablet; Refill: 1 - lithium carbonate (LITHOBID) 300 MG CR tablet; Take 1 tablet (300 mg total) by mouth every morning.  Dispense: 90 tablet; Refill: 1 - zaleplon (SONATA) 10 MG capsule; Take 1 capsule (10 mg total) by mouth at bedtime as needed for sleep.  Dispense: 30 capsule; Refill: 2 - topiramate (TOPAMAX) 50 MG tablet; Take 3 tablets (150 mg total) by mouth daily.  Dispense: 90 tablet; Refill: 1  2. Social anxiety disorder  - escitalopram (LEXAPRO) 10 MG tablet; Take 1 tablet (10 mg total) by mouth daily.  Dispense: 90 tablet; Refill: 1  3. Insomnia, unspecified type  - zaleplon (SONATA) 10 MG capsule; Take 1 capsule (10 mg total) by mouth at bedtime as needed  for sleep.  Dispense: 30 capsule; Refill: 2  Continue same medication regimen. Follow up in 3 months.   Zena Amos, MD 01/27/2020, 2:09 PM

## 2020-02-19 DIAGNOSIS — G4733 Obstructive sleep apnea (adult) (pediatric): Secondary | ICD-10-CM | POA: Diagnosis not present

## 2020-02-19 DIAGNOSIS — E669 Obesity, unspecified: Secondary | ICD-10-CM | POA: Diagnosis not present

## 2020-03-02 DIAGNOSIS — M25571 Pain in right ankle and joints of right foot: Secondary | ICD-10-CM | POA: Diagnosis not present

## 2020-03-02 DIAGNOSIS — R55 Syncope and collapse: Secondary | ICD-10-CM | POA: Diagnosis not present

## 2020-03-02 DIAGNOSIS — E876 Hypokalemia: Secondary | ICD-10-CM | POA: Diagnosis not present

## 2020-03-02 DIAGNOSIS — M7989 Other specified soft tissue disorders: Secondary | ICD-10-CM | POA: Diagnosis not present

## 2020-03-02 DIAGNOSIS — I10 Essential (primary) hypertension: Secondary | ICD-10-CM | POA: Diagnosis not present

## 2020-03-03 DIAGNOSIS — E782 Mixed hyperlipidemia: Secondary | ICD-10-CM | POA: Diagnosis not present

## 2020-03-03 DIAGNOSIS — Z6837 Body mass index (BMI) 37.0-37.9, adult: Secondary | ICD-10-CM | POA: Diagnosis not present

## 2020-03-03 DIAGNOSIS — I10 Essential (primary) hypertension: Secondary | ICD-10-CM | POA: Diagnosis not present

## 2020-03-03 DIAGNOSIS — G4733 Obstructive sleep apnea (adult) (pediatric): Secondary | ICD-10-CM | POA: Diagnosis not present

## 2020-03-03 DIAGNOSIS — E6609 Other obesity due to excess calories: Secondary | ICD-10-CM | POA: Diagnosis not present

## 2020-04-15 ENCOUNTER — Other Ambulatory Visit: Payer: Self-pay | Admitting: Urology

## 2020-04-19 ENCOUNTER — Telehealth: Payer: Self-pay | Admitting: Urology

## 2020-04-19 NOTE — Telephone Encounter (Signed)
Patient was seen March 2021 and he indicated a PSA was going to be drawn at [facility].  He had blood work there in April which did not include a PSA.  Please schedule lab visit for PSA

## 2020-04-22 ENCOUNTER — Encounter (HOSPITAL_COMMUNITY): Payer: Self-pay | Admitting: Psychiatry

## 2020-04-22 ENCOUNTER — Other Ambulatory Visit: Payer: Self-pay

## 2020-04-22 ENCOUNTER — Telehealth (INDEPENDENT_AMBULATORY_CARE_PROVIDER_SITE_OTHER): Payer: PPO | Admitting: Psychiatry

## 2020-04-22 ENCOUNTER — Telehealth: Payer: PPO | Admitting: Psychiatry

## 2020-04-22 DIAGNOSIS — N401 Enlarged prostate with lower urinary tract symptoms: Secondary | ICD-10-CM | POA: Diagnosis not present

## 2020-04-22 DIAGNOSIS — E782 Mixed hyperlipidemia: Secondary | ICD-10-CM | POA: Diagnosis not present

## 2020-04-22 DIAGNOSIS — F39 Unspecified mood [affective] disorder: Secondary | ICD-10-CM | POA: Diagnosis not present

## 2020-04-22 DIAGNOSIS — G4733 Obstructive sleep apnea (adult) (pediatric): Secondary | ICD-10-CM | POA: Diagnosis not present

## 2020-04-22 DIAGNOSIS — G4700 Insomnia, unspecified: Secondary | ICD-10-CM | POA: Diagnosis not present

## 2020-04-22 DIAGNOSIS — K219 Gastro-esophageal reflux disease without esophagitis: Secondary | ICD-10-CM | POA: Diagnosis not present

## 2020-04-22 DIAGNOSIS — F4010 Social phobia, unspecified: Secondary | ICD-10-CM | POA: Diagnosis not present

## 2020-04-22 DIAGNOSIS — E119 Type 2 diabetes mellitus without complications: Secondary | ICD-10-CM | POA: Diagnosis not present

## 2020-04-22 DIAGNOSIS — K760 Fatty (change of) liver, not elsewhere classified: Secondary | ICD-10-CM | POA: Diagnosis not present

## 2020-04-22 DIAGNOSIS — I10 Essential (primary) hypertension: Secondary | ICD-10-CM | POA: Diagnosis not present

## 2020-04-22 DIAGNOSIS — F332 Major depressive disorder, recurrent severe without psychotic features: Secondary | ICD-10-CM | POA: Diagnosis not present

## 2020-04-22 MED ORDER — ZALEPLON 10 MG PO CAPS: 10.0000 mg | ORAL_CAPSULE | Freq: Every evening | ORAL | 2 refills | Status: DC | PRN

## 2020-04-22 MED ORDER — ESCITALOPRAM OXALATE 10 MG PO TABS: 10.0000 mg | ORAL_TABLET | Freq: Every day | ORAL | 1 refills | Status: DC

## 2020-04-22 MED ORDER — LITHIUM CARBONATE ER 300 MG PO TBCR: 300.0000 mg | EXTENDED_RELEASE_TABLET | Freq: Every morning | ORAL | 1 refills | Status: DC

## 2020-04-22 MED ORDER — TOPIRAMATE 50 MG PO TABS: 150.0000 mg | ORAL_TABLET | Freq: Every day | ORAL | 1 refills | Status: DC

## 2020-04-22 NOTE — Progress Notes (Signed)
[facility] MD OP Progress Note  Virtual Visit via Telephone Note  I connected with Mario Knapp on 04/22/20 at  2:30 PM EDT by telephone and verified that I am speaking with the correct person using two identifiers.  Location: Patient: home Provider: Clinic   I discussed the limitations, risks, security and privacy concerns of performing an evaluation and management service by telephone and the availability of in person appointments. I also discussed with the patient that there may be a patient responsible charge related to this service. The patient expressed understanding and agreed to proceed.   I provided 8 minutes of non-face-to-face time during this encounter.   04/22/2020 2:30 PM Mario Knapp  MRN:  [id number]  Chief Complaint: " I am doing fine, just need my refills."  HPI: Patient reported that he is doing well on the current regimen and just wants his refills. He stated that he is able to sleep well with the help of Sonata. He denied any acute issues or concerns at this time.   Visit Diagnosis:    ICD-10-CM   1. Episodic mood disorder ([place])  F39   2. Social anxiety disorder  F40.10   3. Insomnia, unspecified type  G47.00     Past Psychiatric History: mood disorder, anxiety  Past Medical History:  Past Medical History:  Diagnosis Date  . Arthritis   . Depression   . Hypertension   . Sleep apnea     Past Surgical History:  Procedure Laterality Date  . BALLOON DILATION N/A 05/11/2016   Procedure: BALLOON DILATION;  Surgeon: Manya Silvas, MD;  Location: [facility] ENDOSCOPY;  Service: Endoscopy;  Laterality: N/A;  . CHOLECYSTECTOMY    . ESOPHAGOGASTRODUODENOSCOPY N/A 04/15/2016   Procedure: ESOPHAGOGASTRODUODENOSCOPY (EGD) with removal of foreign bodu;  Surgeon: Lucilla Lame, MD;  Location: [facility] ENDOSCOPY;  Service: Endoscopy;  Laterality: N/A;  . ESOPHAGOGASTRODUODENOSCOPY (EGD) WITH PROPOFOL N/A 05/11/2016   Procedure: ESOPHAGOGASTRODUODENOSCOPY (EGD) WITH PROPOFOL;   Surgeon: Manya Silvas, MD;  Location: [facility] ENDOSCOPY;  Service: Endoscopy;  Laterality: N/A;    Family Psychiatric History: see below  Family History:  Family History  Problem Relation Age of Onset  . Depression Mother   . Thyroid disease Mother   . Alcohol abuse Father   . Emphysema Father   . Breast cancer Sister        two times  . Depression Sister   . Alcohol abuse Brother   . Depression Sister     Social History:  Social History   Socioeconomic History  . Marital status: Married    Spouse name: peggy   . Number of children: 4  . Years of education: Not on file  . Highest education level: 9th grade  Occupational History  . Occupation: retired  Tobacco Use  . Smoking status: Former Smoker    Types: Cigarettes    Quit date: 02/25/1981    Years since quitting: 39.1  . Smokeless tobacco: Former Systems developer    Quit date: 02/25/1981  Vaping Use  . Vaping Use: Never used  Substance and Sexual Activity  . Alcohol use: No  . Drug use: No  . Sexual activity: Yes  Other Topics Concern  . Not on file  Social History Narrative  . Not on file   Social Determinants of Health   Financial Resource Strain:   . Difficulty of Paying Living Expenses:   Food Insecurity:   . Worried About Charity fundraiser in the Last Year:   .  Ran Out of Food in the Last Year:   Transportation Needs:   . Freight forwarder (Medical):   Marland Kitchen Lack of Transportation (Non-Medical):   Physical Activity:   . Days of Exercise per Week:   . Minutes of Exercise per Session:   Stress:   . Feeling of Stress :   Social Connections:   . Frequency of Communication with Friends and Family:   . Frequency of Social Gatherings with Friends and Family:   . Attends Religious Services:   . Active Member of Clubs or Organizations:   . Attends Banker Meetings:   Marland Kitchen Marital Status:     Allergies: No Known Allergies  Metabolic Disorder Labs: Lab Results  Component Value Date   HGBA1C 5.4  02/17/2016   No results found for: PROLACTIN Lab Results  Component Value Date   CHOL 213 (H) 06/28/2012   TRIG 227 (H) 06/28/2012   HDL 35 (L) 06/28/2012   VLDL 45 (H) 06/28/2012   LDLCALC 133 (H) 06/28/2012   LDLCALC 118 (H) 12/22/2011   Lab Results  Component Value Date   TSH 2.997 02/17/2016    Therapeutic Level Labs: No results found for: LITHIUM No results found for: VALPROATE No components found for:  CBMZ  Current Medications: Current Outpatient Medications  Medication Sig Dispense Refill  . escitalopram (LEXAPRO) 10 MG tablet Take 1 tablet (10 mg total) by mouth daily. 90 tablet 1  . finasteride (PROSCAR) 5 MG tablet Take 1 tablet (5 mg total) by mouth daily. 30 tablet 11  . furosemide (LASIX) 20 MG tablet     . gabapentin (NEURONTIN) 300 MG capsule     . lithium carbonate (LITHOBID) 300 MG CR tablet Take 1 tablet (300 mg total) by mouth every morning. 90 tablet 1  . Multiple Vitamin (MULTI-VITAMINS) TABS Take by mouth.    . Multiple Vitamin (MULTIVITAMIN WITH MINERALS) TABS tablet Take 1 tablet by mouth daily.    . pantoprazole (PROTONIX) 40 MG tablet TAKE 1 TABLET BY MOUTH EVERY DAY    . tamsulosin (FLOMAX) 0.4 MG CAPS capsule Take 0.4 mg by mouth daily after supper.    . topiramate (TOPAMAX) 50 MG tablet Take 3 tablets (150 mg total) by mouth daily. 270 tablet 1  . vitamin B-12 (CYANOCOBALAMIN) 1000 MCG tablet Take 1,000 mcg by mouth daily.    . zaleplon (SONATA) 10 MG capsule Take 1 capsule (10 mg total) by mouth at bedtime as needed for sleep. 30 capsule 2   No current facility-administered medications for this visit.     Musculoskeletal: Strength & Muscle Tone: unable to assess due to telemed visit Gait & Station: unable to assess due to telemed visit Patient leans: unable to assess due to telemed visit  Psychiatric Specialty Exam: Review of Systems  There were no vitals taken for this visit.There is no height or weight on file to calculate BMI.   General Appearance: unable to assess due to phone visit  Eye Contact:  unable to assess due to phone visit  Speech:  Clear and Coherent and Normal Rate  Volume:  Normal  Mood:  Euthymic  Affect:  Congruent  Thought Process:  Goal Directed, Linear and Descriptions of Associations: Intact  Orientation:  Full (Time, Place, and Person)  Thought Content: Logical   Suicidal Thoughts:  No  Homicidal Thoughts:  No  Memory:  Recent;   Good Remote;   Good  Judgement:  Fair  Insight:  Fair  Psychomotor Activity:  Normal  Concentration:  Concentration: Good and Attention Span: Good  Recall:  Good  Fund of Knowledge: Good  Language: Good  Akathisia:  Negative  Handed:  Right  AIMS (if indicated): not done  Assets:  Communication Skills Desire for Improvement Financial Resources/Insurance Housing  ADL's:  Intact  Cognition: WNL  Sleep:  Good   Screenings: AIMS     Admission (Discharged) from 02/16/2016 in [facility] INPATIENT BEHAVIORAL MEDICINE  AIMS Total Score 0    AUDIT     Admission (Discharged) from 02/16/2016 in [facility] INPATIENT BEHAVIORAL MEDICINE  Alcohol Use Disorder Identification Test Final Score (AUDIT) 0       Assessment and Plan: Patient appears to be stable on his current regimen, we will continue the same medicines for now.  1. Episodic mood disorder (HCC)  - escitalopram (LEXAPRO) 10 MG tablet; Take 1 tablet (10 mg total) by mouth daily.  Dispense: 90 tablet; Refill: 1 - lithium carbonate (LITHOBID) 300 MG CR tablet; Take 1 tablet (300 mg total) by mouth every morning.  Dispense: 90 tablet; Refill: 1 - zaleplon (SONATA) 10 MG capsule; Take 1 capsule (10 mg total) by mouth at bedtime as needed for sleep.  Dispense: 30 capsule; Refill: 2 - topiramate (TOPAMAX) 50 MG tablet; Take 3 tablets (150 mg total) by mouth daily.  Dispense: 90 tablet; Refill: 1  2. Social anxiety disorder  - escitalopram (LEXAPRO) 10 MG tablet; Take 1 tablet (10 mg total) by mouth daily.   Dispense: 90 tablet; Refill: 1  3. Insomnia, unspecified type  - zaleplon (SONATA) 10 MG capsule; Take 1 capsule (10 mg total) by mouth at bedtime as needed for sleep.  Dispense: 30 capsule; Refill: 2  Continue same medication regimen. Follow up in 3 months.   Zena Amos, MD 04/22/2020, 2:30 PM

## 2020-04-22 NOTE — Telephone Encounter (Signed)
APP MADE FOR NEXT WEEK  MICHELLE

## 2020-04-27 ENCOUNTER — Other Ambulatory Visit: Payer: Self-pay | Admitting: *Deleted

## 2020-04-27 DIAGNOSIS — N401 Enlarged prostate with lower urinary tract symptoms: Secondary | ICD-10-CM

## 2020-04-28 ENCOUNTER — Telehealth: Payer: Self-pay | Admitting: Urology

## 2020-04-28 ENCOUNTER — Other Ambulatory Visit: Payer: Self-pay | Admitting: *Deleted

## 2020-04-28 ENCOUNTER — Other Ambulatory Visit: Payer: PPO

## 2020-04-28 MED ORDER — FINASTERIDE 5 MG PO TABS: 5.0000 mg | ORAL_TABLET | Freq: Every day | ORAL | 11 refills | Status: DC

## 2020-04-28 NOTE — Telephone Encounter (Signed)
Pt cx'd lab appt due to being sick (cold) not feeling well. Will call to resched once feeling better. Also stated he needs a refill of his finestaride 5mg  Please advise. Thanks

## 2020-06-04 ENCOUNTER — Other Ambulatory Visit: Payer: Self-pay

## 2020-06-04 ENCOUNTER — Other Ambulatory Visit: Payer: PPO

## 2020-06-04 DIAGNOSIS — N401 Enlarged prostate with lower urinary tract symptoms: Secondary | ICD-10-CM | POA: Diagnosis not present

## 2020-06-05 LAB — PSA: Prostate Specific Ag, Serum: 1.3 ng/mL (ref 0.0–4.0)

## 2020-06-08 ENCOUNTER — Telehealth: Payer: Self-pay | Admitting: *Deleted

## 2020-06-08 NOTE — Telephone Encounter (Signed)
Notified patient as instructed, patient pleased. Discussed follow-up appointments, patient agrees  

## 2020-06-08 NOTE — Telephone Encounter (Signed)
-----   Message from Riki Altes, MD sent at 06/05/2020  4:17 PM EDT ----- PSA was 1.3

## 2020-06-09 DIAGNOSIS — Z8719 Personal history of other diseases of the digestive system: Secondary | ICD-10-CM | POA: Diagnosis not present

## 2020-07-09 DIAGNOSIS — R0600 Dyspnea, unspecified: Secondary | ICD-10-CM | POA: Diagnosis not present

## 2020-07-09 DIAGNOSIS — G4733 Obstructive sleep apnea (adult) (pediatric): Secondary | ICD-10-CM | POA: Diagnosis not present

## 2020-07-09 DIAGNOSIS — I10 Essential (primary) hypertension: Secondary | ICD-10-CM | POA: Diagnosis not present

## 2020-07-09 DIAGNOSIS — N5201 Erectile dysfunction due to arterial insufficiency: Secondary | ICD-10-CM | POA: Diagnosis not present

## 2020-07-09 DIAGNOSIS — E119 Type 2 diabetes mellitus without complications: Secondary | ICD-10-CM | POA: Diagnosis not present

## 2020-07-10 DIAGNOSIS — E669 Obesity, unspecified: Secondary | ICD-10-CM | POA: Diagnosis not present

## 2020-07-10 DIAGNOSIS — G4733 Obstructive sleep apnea (adult) (pediatric): Secondary | ICD-10-CM | POA: Diagnosis not present

## 2020-07-16 DIAGNOSIS — E119 Type 2 diabetes mellitus without complications: Secondary | ICD-10-CM | POA: Diagnosis not present

## 2020-07-16 DIAGNOSIS — Z125 Encounter for screening for malignant neoplasm of prostate: Secondary | ICD-10-CM | POA: Diagnosis not present

## 2020-07-20 ENCOUNTER — Telehealth (HOSPITAL_COMMUNITY): Payer: Self-pay | Admitting: Psychiatry

## 2020-07-20 DIAGNOSIS — G4700 Insomnia, unspecified: Secondary | ICD-10-CM

## 2020-07-20 DIAGNOSIS — F4010 Social phobia, unspecified: Secondary | ICD-10-CM

## 2020-07-20 DIAGNOSIS — F39 Unspecified mood [affective] disorder: Secondary | ICD-10-CM

## 2020-07-20 MED ORDER — LITHIUM CARBONATE ER 300 MG PO TBCR: 300.0000 mg | EXTENDED_RELEASE_TABLET | Freq: Every morning | ORAL | 0 refills | Status: DC

## 2020-07-20 MED ORDER — ZALEPLON 10 MG PO CAPS: 10.0000 mg | ORAL_CAPSULE | Freq: Every evening | ORAL | 1 refills | Status: DC | PRN

## 2020-07-20 MED ORDER — TOPIRAMATE 50 MG PO TABS: 150.0000 mg | ORAL_TABLET | Freq: Every day | ORAL | 0 refills | Status: DC

## 2020-07-20 MED ORDER — ESCITALOPRAM OXALATE 10 MG PO TABS: 10.0000 mg | ORAL_TABLET | Freq: Every day | ORAL | 0 refills | Status: DC

## 2020-07-20 NOTE — Telephone Encounter (Signed)
Prescriptions sent

## 2020-07-21 ENCOUNTER — Telehealth (HOSPITAL_COMMUNITY): Payer: PPO | Admitting: Psychiatry

## 2020-07-21 DIAGNOSIS — Z01818 Encounter for other preprocedural examination: Secondary | ICD-10-CM | POA: Diagnosis not present

## 2020-07-21 DIAGNOSIS — R0600 Dyspnea, unspecified: Secondary | ICD-10-CM | POA: Diagnosis not present

## 2020-07-23 DIAGNOSIS — E782 Mixed hyperlipidemia: Secondary | ICD-10-CM | POA: Diagnosis not present

## 2020-07-23 DIAGNOSIS — F332 Major depressive disorder, recurrent severe without psychotic features: Secondary | ICD-10-CM | POA: Diagnosis not present

## 2020-07-23 DIAGNOSIS — Z6837 Body mass index (BMI) 37.0-37.9, adult: Secondary | ICD-10-CM | POA: Diagnosis not present

## 2020-07-23 DIAGNOSIS — E6609 Other obesity due to excess calories: Secondary | ICD-10-CM | POA: Diagnosis not present

## 2020-07-23 DIAGNOSIS — Z0001 Encounter for general adult medical examination with abnormal findings: Secondary | ICD-10-CM | POA: Diagnosis not present

## 2020-07-23 DIAGNOSIS — I10 Essential (primary) hypertension: Secondary | ICD-10-CM | POA: Diagnosis not present

## 2020-07-23 DIAGNOSIS — K760 Fatty (change of) liver, not elsewhere classified: Secondary | ICD-10-CM | POA: Diagnosis not present

## 2020-07-23 DIAGNOSIS — E119 Type 2 diabetes mellitus without complications: Secondary | ICD-10-CM | POA: Diagnosis not present

## 2020-07-23 DIAGNOSIS — G4733 Obstructive sleep apnea (adult) (pediatric): Secondary | ICD-10-CM | POA: Diagnosis not present

## 2020-07-23 DIAGNOSIS — I700 Atherosclerosis of aorta: Secondary | ICD-10-CM | POA: Diagnosis not present

## 2020-09-01 ENCOUNTER — Encounter (HOSPITAL_COMMUNITY): Payer: Self-pay | Admitting: Psychiatry

## 2020-09-01 ENCOUNTER — Telehealth (INDEPENDENT_AMBULATORY_CARE_PROVIDER_SITE_OTHER): Payer: PPO | Admitting: Psychiatry

## 2020-09-01 ENCOUNTER — Telehealth (HOSPITAL_COMMUNITY): Payer: PPO | Admitting: Psychiatry

## 2020-09-01 ENCOUNTER — Other Ambulatory Visit: Payer: Self-pay

## 2020-09-01 DIAGNOSIS — G4700 Insomnia, unspecified: Secondary | ICD-10-CM | POA: Diagnosis not present

## 2020-09-01 DIAGNOSIS — F4010 Social phobia, unspecified: Secondary | ICD-10-CM

## 2020-09-01 DIAGNOSIS — F39 Unspecified mood [affective] disorder: Secondary | ICD-10-CM

## 2020-09-01 NOTE — Progress Notes (Signed)
[facility] MD OP Progress Note  Virtual Visit via Telephone Note  I connected with Mario Knapp on 09/01/20 at  1:40 PM EDT by telephone and verified that I am speaking with the correct person using two identifiers.  Location: Patient: home Provider: Clinic   I discussed the limitations, risks, security and privacy concerns of performing an evaluation and management service by telephone and the availability of in person appointments. I also discussed with the patient that there may be a patient responsible charge related to this service. The patient expressed understanding and agreed to proceed.   I provided 13 minutes of non-face-to-face time during this encounter.   09/01/2020 1:35 PM Mario Knapp  MRN:  [id number]  Chief Complaint: " I am doing great."  HPI: Well.  He informed that his mood has been stable.  He is sleeping well.  He stated that he does not need any refills at this time.  His wife was heard in the background saying that he needs his lithium levels to be checked.  Writer checked his PCPs records and noted that he has not had any lithium levels checked recently.  Writer informed that the orders will be placed and he can get them checked when he gets a chance.  Patient verbalized his understanding.  Visit Diagnosis:    ICD-10-CM   1. Episodic mood disorder (HCC)  F39   2. Social anxiety disorder  F40.10   3. Insomnia, unspecified type  G47.00     Past Psychiatric History: mood disorder, anxiety  Past Medical History:  Past Medical History:  Diagnosis Date  . Arthritis   . Depression   . Hypertension   . Sleep apnea     Past Surgical History:  Procedure Laterality Date  . BALLOON DILATION N/A 05/11/2016   Procedure: BALLOON DILATION;  Surgeon: Scot Jun, MD;  Location: [facility] ENDOSCOPY;  Service: Endoscopy;  Laterality: N/A;  . CHOLECYSTECTOMY    . ESOPHAGOGASTRODUODENOSCOPY N/A 04/15/2016   Procedure: ESOPHAGOGASTRODUODENOSCOPY (EGD) with removal of  foreign bodu;  Surgeon: Midge Minium, MD;  Location: [facility] ENDOSCOPY;  Service: Endoscopy;  Laterality: N/A;  . ESOPHAGOGASTRODUODENOSCOPY (EGD) WITH PROPOFOL N/A 05/11/2016   Procedure: ESOPHAGOGASTRODUODENOSCOPY (EGD) WITH PROPOFOL;  Surgeon: Scot Jun, MD;  Location: [facility] ENDOSCOPY;  Service: Endoscopy;  Laterality: N/A;    Family Psychiatric History: see below  Family History:  Family History  Problem Relation Age of Onset  . Depression Mother   . Thyroid disease Mother   . Alcohol abuse Father   . Emphysema Father   . Breast cancer Sister        two times  . Depression Sister   . Alcohol abuse Brother   . Depression Sister     Social History:  Social History   Socioeconomic History  . Marital status: Married    Spouse name: peggy   . Number of children: 4  . Years of education: Not on file  . Highest education level: 9th grade  Occupational History  . Occupation: retired  Tobacco Use  . Smoking status: Former Smoker    Types: Cigarettes    Quit date: 02/25/1981    Years since quitting: 39.5  . Smokeless tobacco: Former Neurosurgeon    Quit date: 02/25/1981  Vaping Use  . Vaping Use: Never used  Substance and Sexual Activity  . Alcohol use: No  . Drug use: No  . Sexual activity: Yes  Other Topics Concern  . Not on file  Social History  Narrative  . Not on file   Social Determinants of Health   Financial Resource Strain:   . Difficulty of Paying Living Expenses: Not on file  Food Insecurity:   . Worried About Programme researcher, broadcasting/film/video in the Last Year: Not on file  . Ran Out of Food in the Last Year: Not on file  Transportation Needs:   . Lack of Transportation (Medical): Not on file  . Lack of Transportation (Non-Medical): Not on file  Physical Activity:   . Days of Exercise per Week: Not on file  . Minutes of Exercise per Session: Not on file  Stress:   . Feeling of Stress : Not on file  Social Connections:   . Frequency of Communication with Friends and  Family: Not on file  . Frequency of Social Gatherings with Friends and Family: Not on file  . Attends Religious Services: Not on file  . Active Member of Clubs or Organizations: Not on file  . Attends Banker Meetings: Not on file  . Marital Status: Not on file    Allergies: No Known Allergies  Metabolic Disorder Labs: Lab Results  Component Value Date   HGBA1C 5.4 02/17/2016   No results found for: PROLACTIN Lab Results  Component Value Date   CHOL 213 (H) 06/28/2012   TRIG 227 (H) 06/28/2012   HDL 35 (L) 06/28/2012   VLDL 45 (H) 06/28/2012   LDLCALC 133 (H) 06/28/2012   LDLCALC 118 (H) 12/22/2011   Lab Results  Component Value Date   TSH 2.997 02/17/2016    Therapeutic Level Labs: No results found for: LITHIUM No results found for: VALPROATE No components found for:  CBMZ  Current Medications: Current Outpatient Medications  Medication Sig Dispense Refill  . escitalopram (LEXAPRO) 10 MG tablet Take 1 tablet (10 mg total) by mouth daily. 90 tablet 0  . finasteride (PROSCAR) 5 MG tablet Take 1 tablet (5 mg total) by mouth daily. 30 tablet 11  . furosemide (LASIX) 20 MG tablet     . gabapentin (NEURONTIN) 300 MG capsule     . lithium carbonate (LITHOBID) 300 MG CR tablet Take 1 tablet (300 mg total) by mouth every morning. 90 tablet 0  . Multiple Vitamin (MULTI-VITAMINS) TABS Take by mouth.    . Multiple Vitamin (MULTIVITAMIN WITH MINERALS) TABS tablet Take 1 tablet by mouth daily.    . pantoprazole (PROTONIX) 40 MG tablet TAKE 1 TABLET BY MOUTH EVERY DAY    . tamsulosin (FLOMAX) 0.4 MG CAPS capsule Take 0.4 mg by mouth daily after supper.    . topiramate (TOPAMAX) 50 MG tablet Take 3 tablets (150 mg total) by mouth daily. 270 tablet 0  . vitamin B-12 (CYANOCOBALAMIN) 1000 MCG tablet Take 1,000 mcg by mouth daily.    . zaleplon (SONATA) 10 MG capsule Take 1 capsule (10 mg total) by mouth at bedtime as needed for sleep. 30 capsule 1   No current  facility-administered medications for this visit.     Musculoskeletal: Strength & Muscle Tone: unable to assess due to telemed visit Gait & Station: unable to assess due to telemed visit Patient leans: unable to assess due to telemed visit  Psychiatric Specialty Exam: Review of Systems  There were no vitals taken for this visit.There is no height or weight on file to calculate BMI.  General Appearance: unable to assess due to phone visit  Eye Contact:  unable to assess due to phone visit  Speech:  Clear and Coherent  and Normal Rate  Volume:  Normal  Mood:  Euthymic  Affect:  Congruent  Thought Process:  Goal Directed, Linear and Descriptions of Associations: Intact  Orientation:  Full (Time, Place, and Person)  Thought Content: Logical   Suicidal Thoughts:  No  Homicidal Thoughts:  No  Memory:  Recent;   Good Remote;   Good  Judgement:  Fair  Insight:  Fair  Psychomotor Activity:  Normal  Concentration:  Concentration: Good and Attention Span: Good  Recall:  Good  Fund of Knowledge: Good  Language: Good  Akathisia:  Negative  Handed:  Right  AIMS (if indicated): not done  Assets:  Communication Skills Desire for Improvement Financial Resources/Insurance Housing  ADL's:  Intact  Cognition: WNL  Sleep:  Good   Screenings: AIMS     Admission (Discharged) from 02/16/2016 in [facility] INPATIENT BEHAVIORAL MEDICINE  AIMS Total Score 0    AUDIT     Admission (Discharged) from 02/16/2016 in [facility] INPATIENT BEHAVIORAL MEDICINE  Alcohol Use Disorder Identification Test Final Score (AUDIT) 0       Assessment and Plan: Patient appears to be stable on his current regimen, we will continue the same medicines for now.  1. Episodic mood disorder (HCC)  - escitalopram (LEXAPRO) 10 MG tablet; Take 1 tablet (10 mg total) by mouth daily.  Dispense: 90 tablet; Refill: 1 - lithium carbonate (LITHOBID) 300 MG CR tablet; Take 1 tablet (300 mg total) by mouth every morning.  Dispense: 90  tablet; Refill: 1 - zaleplon (SONATA) 10 MG capsule; Take 1 capsule (10 mg total) by mouth at bedtime as needed for sleep.  Dispense: 30 capsule; Refill: 2 - topiramate (TOPAMAX) 50 MG tablet; Take 3 tablets (150 mg total) by mouth daily.  Dispense: 90 tablet; Refill: 1 - Lithium levels to be checked, order placed for lab collect.  2. Social anxiety disorder  - escitalopram (LEXAPRO) 10 MG tablet; Take 1 tablet (10 mg total) by mouth daily.  Dispense: 90 tablet; Refill: 1  3. Insomnia, unspecified type  - zaleplon (SONATA) 10 MG capsule; Take 1 capsule (10 mg total) by mouth at bedtime as needed for sleep.  Dispense: 30 capsule; Refill: 2  Continue same medication regimen. Follow up in 3 months.   Zena Amos, MD 09/01/2020, 1:35 PM

## 2020-10-02 ENCOUNTER — Other Ambulatory Visit (HOSPITAL_COMMUNITY): Payer: Self-pay | Admitting: Psychiatry

## 2020-10-02 DIAGNOSIS — F39 Unspecified mood [affective] disorder: Secondary | ICD-10-CM

## 2020-10-02 DIAGNOSIS — G4700 Insomnia, unspecified: Secondary | ICD-10-CM

## 2020-10-08 DIAGNOSIS — E782 Mixed hyperlipidemia: Secondary | ICD-10-CM | POA: Diagnosis not present

## 2020-10-08 DIAGNOSIS — K219 Gastro-esophageal reflux disease without esophagitis: Secondary | ICD-10-CM | POA: Diagnosis not present

## 2020-10-08 DIAGNOSIS — I10 Essential (primary) hypertension: Secondary | ICD-10-CM | POA: Diagnosis not present

## 2020-10-08 DIAGNOSIS — N5201 Erectile dysfunction due to arterial insufficiency: Secondary | ICD-10-CM | POA: Diagnosis not present

## 2020-10-08 DIAGNOSIS — E6609 Other obesity due to excess calories: Secondary | ICD-10-CM | POA: Diagnosis not present

## 2020-10-08 DIAGNOSIS — Z6837 Body mass index (BMI) 37.0-37.9, adult: Secondary | ICD-10-CM | POA: Diagnosis not present

## 2020-10-08 DIAGNOSIS — G4733 Obstructive sleep apnea (adult) (pediatric): Secondary | ICD-10-CM | POA: Diagnosis not present

## 2020-10-09 DIAGNOSIS — G4733 Obstructive sleep apnea (adult) (pediatric): Secondary | ICD-10-CM | POA: Diagnosis not present

## 2020-10-09 DIAGNOSIS — E669 Obesity, unspecified: Secondary | ICD-10-CM | POA: Diagnosis not present

## 2020-11-19 DIAGNOSIS — E119 Type 2 diabetes mellitus without complications: Secondary | ICD-10-CM | POA: Diagnosis not present

## 2020-11-24 ENCOUNTER — Encounter (HOSPITAL_COMMUNITY): Payer: Self-pay | Admitting: Psychiatry

## 2020-11-24 ENCOUNTER — Other Ambulatory Visit: Payer: Self-pay

## 2020-11-24 ENCOUNTER — Telehealth (INDEPENDENT_AMBULATORY_CARE_PROVIDER_SITE_OTHER): Payer: PPO | Admitting: Psychiatry

## 2020-11-24 DIAGNOSIS — G4700 Insomnia, unspecified: Secondary | ICD-10-CM | POA: Diagnosis not present

## 2020-11-24 DIAGNOSIS — F4010 Social phobia, unspecified: Secondary | ICD-10-CM

## 2020-11-24 DIAGNOSIS — F39 Unspecified mood [affective] disorder: Secondary | ICD-10-CM

## 2020-11-24 MED ORDER — ESCITALOPRAM OXALATE 10 MG PO TABS: 10.0000 mg | ORAL_TABLET | Freq: Every day | ORAL | 0 refills | Status: DC

## 2020-11-24 MED ORDER — ZALEPLON 10 MG PO CAPS: 10.0000 mg | ORAL_CAPSULE | Freq: Every evening | ORAL | 1 refills | Status: DC | PRN

## 2020-11-24 MED ORDER — LITHIUM CARBONATE ER 300 MG PO TBCR: 300.0000 mg | EXTENDED_RELEASE_TABLET | Freq: Every morning | ORAL | 0 refills | Status: DC

## 2020-11-24 MED ORDER — TOPIRAMATE 50 MG PO TABS: 150.0000 mg | ORAL_TABLET | Freq: Every day | ORAL | 0 refills | Status: DC

## 2020-11-24 NOTE — Progress Notes (Signed)
BH MD OP Progress Note  Virtual Visit via Telephone Note  I connected with Mario Knapp on 11/24/20 at  3:00 PM EST by telephone and verified that I am speaking with the correct person using two identifiers.  Location: Patient: home Provider: home office   I discussed the limitations, risks, security and privacy concerns of performing an evaluation and management service by telephone and the availability of in person appointments. I also discussed with the patient that there may be a patient responsible charge related to this service. The patient expressed understanding and agreed to proceed.   I provided 12 minutes of non-face-to-face time during this encounter.   11/24/2020 2:49 PM Mario Knapp  MRN:  [id number]  Chief Complaint: " Everything is fine."  HPI: Pt reported that everything is going well for him. He is looking to welcoming a new great-grandchild this year. He denied any issues pertaining to his mood or sleep. He requested refills for all his meds. Pt was reminded to ger his lithium levels checked as soon as possible, he ha snot had a lithium level done in more than a year as per EMR. Lab orders were placed in Oct 2021.   Visit Diagnosis:    ICD-10-CM   1. Episodic mood disorder (HCC)  F39   2. Social anxiety disorder  F40.10   3. Insomnia, unspecified type  G47.00     Past Psychiatric History: mood disorder, anxiety  Past Medical History:  Past Medical History:  Diagnosis Date  . Arthritis   . Depression   . Hypertension   . Sleep apnea     Past Surgical History:  Procedure Laterality Date  . BALLOON DILATION N/A 05/11/2016   Procedure: BALLOON DILATION;  Surgeon: Scot Jun, MD;  Location: [facility] ENDOSCOPY;  Service: Endoscopy;  Laterality: N/A;  . CHOLECYSTECTOMY    . ESOPHAGOGASTRODUODENOSCOPY N/A 04/15/2016   Procedure: ESOPHAGOGASTRODUODENOSCOPY (EGD) with removal of foreign bodu;  Surgeon: Midge Minium, MD;  Location: [facility] ENDOSCOPY;   Service: Endoscopy;  Laterality: N/A;  . ESOPHAGOGASTRODUODENOSCOPY (EGD) WITH PROPOFOL N/A 05/11/2016   Procedure: ESOPHAGOGASTRODUODENOSCOPY (EGD) WITH PROPOFOL;  Surgeon: Scot Jun, MD;  Location: [facility] ENDOSCOPY;  Service: Endoscopy;  Laterality: N/A;    Family Psychiatric History: see below  Family History:  Family History  Problem Relation Age of Onset  . Depression Mother   . Thyroid disease Mother   . Alcohol abuse Father   . Emphysema Father   . Breast cancer Sister        two times  . Depression Sister   . Alcohol abuse Brother   . Depression Sister     Social History:  Social History   Socioeconomic History  . Marital status: Married    Spouse name: peggy   . Number of children: 4  . Years of education: Not on file  . Highest education level: 9th grade  Occupational History  . Occupation: retired  Tobacco Use  . Smoking status: Former Smoker    Types: Cigarettes    Quit date: 02/25/1981    Years since quitting: 39.7  . Smokeless tobacco: Former Neurosurgeon    Quit date: 02/25/1981  Vaping Use  . Vaping Use: Never used  Substance and Sexual Activity  . Alcohol use: No  . Drug use: No  . Sexual activity: Yes  Other Topics Concern  . Not on file  Social History Narrative  . Not on file   Social Determinants of Health   Financial Resource  Strain: Not on file  Food Insecurity: Not on file  Transportation Needs: Not on file  Physical Activity: Not on file  Stress: Not on file  Social Connections: Not on file    Allergies: No Known Allergies  Metabolic Disorder Labs: Lab Results  Component Value Date   HGBA1C 5.4 02/17/2016   No results found for: PROLACTIN Lab Results  Component Value Date   CHOL 213 (H) 06/28/2012   TRIG 227 (H) 06/28/2012   HDL 35 (L) 06/28/2012   VLDL 45 (H) 06/28/2012   LDLCALC 133 (H) 06/28/2012   LDLCALC 118 (H) 12/22/2011   Lab Results  Component Value Date   TSH 2.997 02/17/2016    Therapeutic Level Labs: No  results found for: LITHIUM No results found for: VALPROATE No components found for:  CBMZ  Current Medications: Current Outpatient Medications  Medication Sig Dispense Refill  . escitalopram (LEXAPRO) 10 MG tablet Take 1 tablet (10 mg total) by mouth daily. 90 tablet 0  . finasteride (PROSCAR) 5 MG tablet Take 1 tablet (5 mg total) by mouth daily. 30 tablet 11  . furosemide (LASIX) 20 MG tablet     . gabapentin (NEURONTIN) 300 MG capsule     . lithium carbonate (LITHOBID) 300 MG CR tablet Take 1 tablet (300 mg total) by mouth every morning. 90 tablet 0  . Multiple Vitamin (MULTI-VITAMINS) TABS Take by mouth.    . Multiple Vitamin (MULTIVITAMIN WITH MINERALS) TABS tablet Take 1 tablet by mouth daily.    . pantoprazole (PROTONIX) 40 MG tablet TAKE 1 TABLET BY MOUTH EVERY DAY    . tamsulosin (FLOMAX) 0.4 MG CAPS capsule Take 0.4 mg by mouth daily after supper.    . topiramate (TOPAMAX) 50 MG tablet TAKE 3 TABLETS BY MOUTH EVERY DAY 270 tablet 0  . vitamin B-12 (CYANOCOBALAMIN) 1000 MCG tablet Take 1,000 mcg by mouth daily.    . zaleplon (SONATA) 10 MG capsule TAKE 1 CAPSULE (10 MG TOTAL) BY MOUTH AT BEDTIME AS NEEDED FOR SLEEP. 30 capsule 1   No current facility-administered medications for this visit.     Musculoskeletal: Strength & Muscle Tone: unable to assess due to telemed visit Gait & Station: unable to assess due to telemed visit Patient leans: unable to assess due to telemed visit  Psychiatric Specialty Exam: Review of Systems  There were no vitals taken for this visit.There is no height or weight on file to calculate BMI.  General Appearance: unable to assess due to phone visit  Eye Contact:  unable to assess due to phone visit  Speech:  Clear and Coherent and Normal Rate  Volume:  Normal  Mood:  Euthymic  Affect:  Congruent  Thought Process:  Goal Directed, Linear and Descriptions of Associations: Intact  Orientation:  Full (Time, Place, and Person)  Thought Content:  Logical   Suicidal Thoughts:  No  Homicidal Thoughts:  No  Memory:  Recent;   Good Remote;   Good  Judgement:  Fair  Insight:  Fair  Psychomotor Activity:  Normal  Concentration:  Concentration: Good and Attention Span: Good  Recall:  Good  Fund of Knowledge: Good  Language: Good  Akathisia:  Negative  Handed:  Right  AIMS (if indicated): not done  Assets:  Communication Skills Desire for Improvement Financial Resources/Insurance Housing  ADL's:  Intact  Cognition: WNL  Sleep:  Good   Screenings: AIMS   Flowsheet Row Admission (Discharged) from 02/16/2016 in [facility] INPATIENT BEHAVIORAL MEDICINE  AIMS Total Score  0    AUDIT   Flowsheet Row Admission (Discharged) from 02/16/2016 in [facility] INPATIENT BEHAVIORAL MEDICINE  Alcohol Use Disorder Identification Test Final Score (AUDIT) 0       Assessment and Plan: Pt is stable on his regimen. He was reminded to get his lithium levels checked as soon as possible as not had one done in a while as per the EMR.   1. Episodic mood disorder (HCC)  - escitalopram (LEXAPRO) 10 MG tablet; Take 1 tablet (10 mg total) by mouth daily.  Dispense: 90 tablet; Refill: 1 - lithium carbonate (LITHOBID) 300 MG CR tablet; Take 1 tablet (300 mg total) by mouth every morning.  Dispense: 90 tablet; Refill: 1 - zaleplon (SONATA) 10 MG capsule; Take 1 capsule (10 mg total) by mouth at bedtime as needed for sleep.  Dispense: 30 capsule; Refill: 2 - topiramate (TOPAMAX) 50 MG tablet; Take 3 tablets (150 mg total) by mouth daily.  Dispense: 90 tablet; Refill: 1 - Lithium levels to be checked, order placed once again today.  2. Social anxiety disorder  - escitalopram (LEXAPRO) 10 MG tablet; Take 1 tablet (10 mg total) by mouth daily.  Dispense: 90 tablet; Refill: 1  3. Insomnia, unspecified type  - zaleplon (SONATA) 10 MG capsule; Take 1 capsule (10 mg total) by mouth at bedtime as needed for sleep.  Dispense: 30 capsule; Refill: 2  Continue same  medication regimen. Follow up in 3 months.   Zena Amos, MD 11/24/2020, 2:49 PM

## 2020-11-25 DIAGNOSIS — E782 Mixed hyperlipidemia: Secondary | ICD-10-CM | POA: Diagnosis not present

## 2020-11-25 DIAGNOSIS — F332 Major depressive disorder, recurrent severe without psychotic features: Secondary | ICD-10-CM | POA: Diagnosis not present

## 2020-11-25 DIAGNOSIS — G4733 Obstructive sleep apnea (adult) (pediatric): Secondary | ICD-10-CM | POA: Diagnosis not present

## 2020-11-25 DIAGNOSIS — E119 Type 2 diabetes mellitus without complications: Secondary | ICD-10-CM | POA: Diagnosis not present

## 2020-11-25 DIAGNOSIS — I10 Essential (primary) hypertension: Secondary | ICD-10-CM | POA: Diagnosis not present

## 2020-11-25 DIAGNOSIS — K760 Fatty (change of) liver, not elsewhere classified: Secondary | ICD-10-CM | POA: Diagnosis not present

## 2020-12-02 DIAGNOSIS — E119 Type 2 diabetes mellitus without complications: Secondary | ICD-10-CM | POA: Diagnosis not present

## 2020-12-04 ENCOUNTER — Other Ambulatory Visit (HOSPITAL_COMMUNITY): Payer: Self-pay | Admitting: Psychiatry

## 2020-12-04 DIAGNOSIS — G4700 Insomnia, unspecified: Secondary | ICD-10-CM

## 2020-12-04 DIAGNOSIS — F39 Unspecified mood [affective] disorder: Secondary | ICD-10-CM

## 2021-01-07 ENCOUNTER — Ambulatory Visit: Payer: PPO | Admitting: Urology

## 2021-01-07 DIAGNOSIS — E669 Obesity, unspecified: Secondary | ICD-10-CM | POA: Diagnosis not present

## 2021-01-07 DIAGNOSIS — G4733 Obstructive sleep apnea (adult) (pediatric): Secondary | ICD-10-CM | POA: Diagnosis not present

## 2021-01-11 ENCOUNTER — Ambulatory Visit: Payer: PPO | Admitting: Urology

## 2021-01-11 ENCOUNTER — Encounter: Payer: Self-pay | Admitting: Urology

## 2021-01-11 ENCOUNTER — Other Ambulatory Visit: Payer: Self-pay

## 2021-01-11 VITALS — BP 144/73 | HR 91 | Ht 70.0 in | Wt 268.0 lb

## 2021-01-11 DIAGNOSIS — R361 Hematospermia: Secondary | ICD-10-CM

## 2021-01-11 DIAGNOSIS — N400 Enlarged prostate without lower urinary tract symptoms: Secondary | ICD-10-CM

## 2021-01-11 MED ORDER — FINASTERIDE 5 MG PO TABS: 5.0000 mg | ORAL_TABLET | Freq: Every day | ORAL | 3 refills | Status: DC

## 2021-01-11 NOTE — Progress Notes (Signed)
01/11/2021 9:37 AM   Mario Knapp [birth year] [id number]  Referring provider: Gracelyn Nurse, MD [address] [place],  [place] [address]  Chief Complaint  Patient presents with  . Other    Urologic history: 1.  Recurrent hematospermia  PSA 01/2019 3.77  CT pelvis 06/2019 with 96 cc prostate, prostatic calcifications, normal seminal vesicles  Cystoscopy 06/2019 prominent lateral lobe enlargement/hypervascularity  Started finasteride 06/2019   HPI: 75 y.o. male presents for annual follow-up.   No problems since last years visit  Denies dysuria, gross hematuria  No bothersome LUTS  Hematospermia resolved  PSA 07/2020 1.41 (uncorrected)   PMH: Past Medical History:  Diagnosis Date  . Arthritis   . Depression   . Hypertension   . Sleep apnea     Surgical History: Past Surgical History:  Procedure Laterality Date  . BALLOON DILATION N/A 05/11/2016   Procedure: BALLOON DILATION;  Surgeon: Scot Jun, MD;  Location: [facility] ENDOSCOPY;  Service: Endoscopy;  Laterality: N/A;  . CHOLECYSTECTOMY    . ESOPHAGOGASTRODUODENOSCOPY N/A 04/15/2016   Procedure: ESOPHAGOGASTRODUODENOSCOPY (EGD) with removal of foreign bodu;  Surgeon: Midge Minium, MD;  Location: [facility] ENDOSCOPY;  Service: Endoscopy;  Laterality: N/A;  . ESOPHAGOGASTRODUODENOSCOPY (EGD) WITH PROPOFOL N/A 05/11/2016   Procedure: ESOPHAGOGASTRODUODENOSCOPY (EGD) WITH PROPOFOL;  Surgeon: Scot Jun, MD;  Location: [facility] ENDOSCOPY;  Service: Endoscopy;  Laterality: N/A;    Home Medications:  Allergies as of 01/11/2021   No Known Allergies     Medication List       Accurate as of January 11, 2021  9:37 AM. If you have any questions, ask your nurse or doctor.        escitalopram 10 MG tablet Commonly known as: LEXAPRO Take 1 tablet (10 mg total) by mouth daily.   finasteride 5 MG tablet Commonly known as: PROSCAR Take 1 tablet (5 mg total) by mouth daily.   furosemide 20 MG tablet Commonly  known as: LASIX   gabapentin 300 MG capsule Commonly known as: NEURONTIN   Klor-Con M20 20 MEQ tablet Generic drug: potassium chloride SA Take 20 mEq by mouth daily.   lisinopril 2.5 MG tablet Commonly known as: ZESTRIL Take by mouth.   lithium carbonate 300 MG CR tablet Commonly known as: LITHOBID Take 1 tablet (300 mg total) by mouth every morning.   metFORMIN 500 MG tablet Commonly known as: GLUCOPHAGE Take 500 mg by mouth 2 (two) times daily.   Multi-Vitamins Tabs Take by mouth.   multivitamin with minerals Tabs tablet Take 1 tablet by mouth daily.   pantoprazole 40 MG tablet Commonly known as: PROTONIX TAKE 1 TABLET BY MOUTH EVERY DAY   tamsulosin 0.4 MG Caps capsule Commonly known as: FLOMAX Take 0.4 mg by mouth daily after supper.   topiramate 50 MG tablet Commonly known as: TOPAMAX Take 3 tablets (150 mg total) by mouth daily.   vitamin B-12 1000 MCG tablet Commonly known as: CYANOCOBALAMIN Take 1,000 mcg by mouth daily.   zaleplon 10 MG capsule Commonly known as: SONATA TAKE 1 CAPSULE (10 MG TOTAL) BY MOUTH AT BEDTIME AS NEEDED FOR SLEEP.       Allergies: No Known Allergies  Family History: Family History  Problem Relation Age of Onset  . Depression Mother   . Thyroid disease Mother   . Alcohol abuse Father   . Emphysema Father   . Breast cancer Sister        two times  . Depression Sister   .  Alcohol abuse Brother   . Depression Sister     Social History:  reports that he quit smoking about 39 years ago. His smoking use included cigarettes. He quit smokeless tobacco use about 39 years ago. He reports that he does not drink alcohol and does not use drugs.   Physical Exam: BP (!) 144/73   Pulse 91   Ht 5\' 10"  (1.778 m)   Wt 268 lb (121.6 kg)   BMI 38.45 kg/m   Constitutional:  Alert and oriented, No acute distress. HEENT: [place] AT, moist mucus membranes.  Trachea midline, no masses. Cardiovascular: No clubbing, cyanosis, or  edema. Respiratory: Normal respiratory effort, no increased work of breathing.   Assessment & Plan:    1. Hematospermia  Resolved  Finasteride refilled  2.  BPH without LUTS  UA today with 6-10 WBC/3-10 RBC  Prior cystoscopy unremarkable and patient asymptomatic  Recheck 1 year  Continue annual follow-up   , MD  [facility] [address] [place], [place] [id number]

## 2021-01-13 LAB — URINALYSIS, COMPLETE
Bilirubin, UA: NEGATIVE
Glucose, UA: NEGATIVE
Ketones, UA: NEGATIVE
Nitrite, UA: NEGATIVE
Protein,UA: NEGATIVE
Specific Gravity, UA: 1.015 (ref 1.005–1.030)
Urobilinogen, Ur: 0.2 mg/dL (ref 0.2–1.0)
pH, UA: 5 (ref 5.0–7.5)

## 2021-01-13 LAB — MICROSCOPIC EXAMINATION
Bacteria, UA: NONE SEEN
Epithelial Cells (non renal): NONE SEEN /hpf (ref 0–10)

## 2021-02-02 ENCOUNTER — Other Ambulatory Visit (HOSPITAL_COMMUNITY): Payer: Self-pay | Admitting: Psychiatry

## 2021-02-02 DIAGNOSIS — F39 Unspecified mood [affective] disorder: Secondary | ICD-10-CM

## 2021-02-02 DIAGNOSIS — G4700 Insomnia, unspecified: Secondary | ICD-10-CM

## 2021-02-04 IMAGING — CT CT PELVIS WITH CONTRAST
2 of 3 series · 16 of 46 positions shown, 18 images · IV contrast (APPLIED)
Comparison: 06/29/2008 CT abdomen/pelvis.

CLINICAL DATA: Persistent hematospermia for several months. No
reported history of cancer.

EXAM:
CT PELVIS WITH CONTRAST
TECHNIQUE: Multidetector CT imaging of the pelvis was performed using the
standard protocol following the bolus administration of intravenous
contrast.
CONTRAST:  100mL OMNIPAQUE IOHEXOL 300 MG/ML  SOLN

[Series 2: routine abd/pel with · axial · 0.95mm/px · z∈[-456,-141]mm · 13 of 73 slices shown, 15 images]
[im 5/73  soft-tissue]
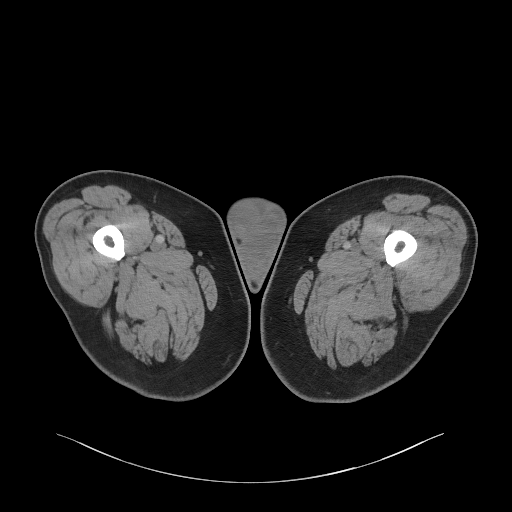
[im 5/73  bone]
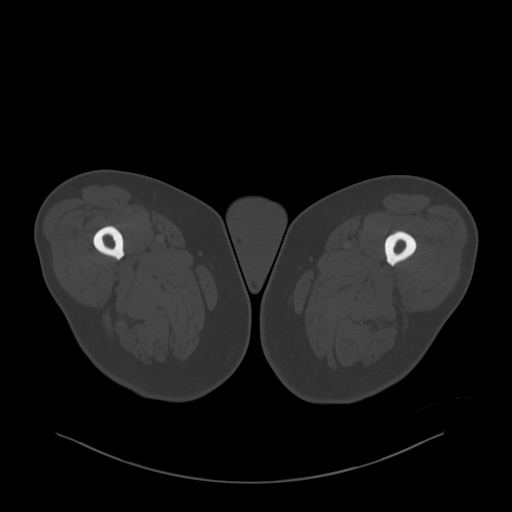
[im 10/73  soft-tissue]
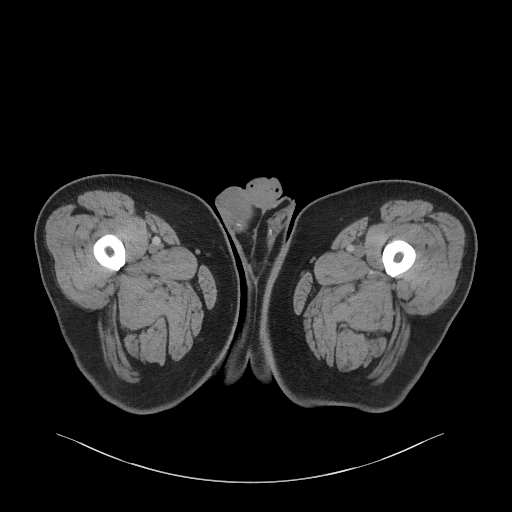
[im 14/73  soft-tissue]
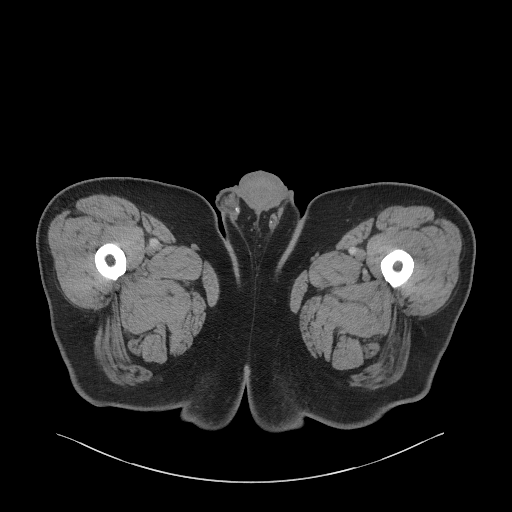
[im 21/73  soft-tissue]
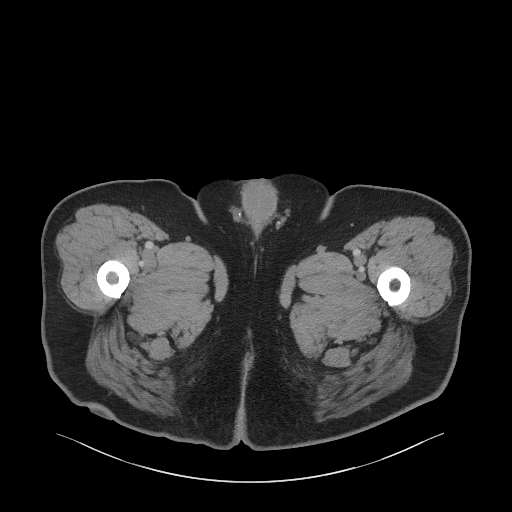
[im 26/73  soft-tissue]
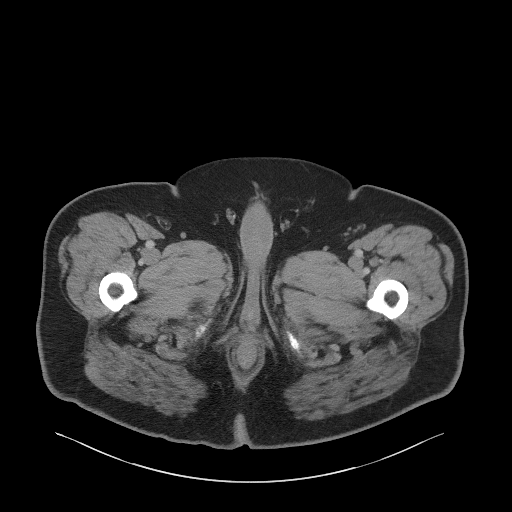
[im 31/73  soft-tissue]
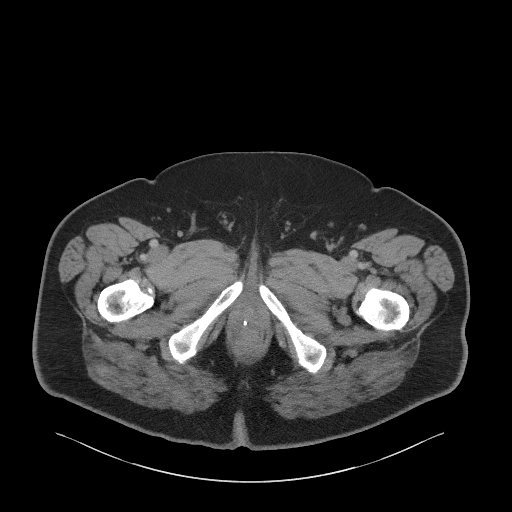
[im 38/73  soft-tissue]
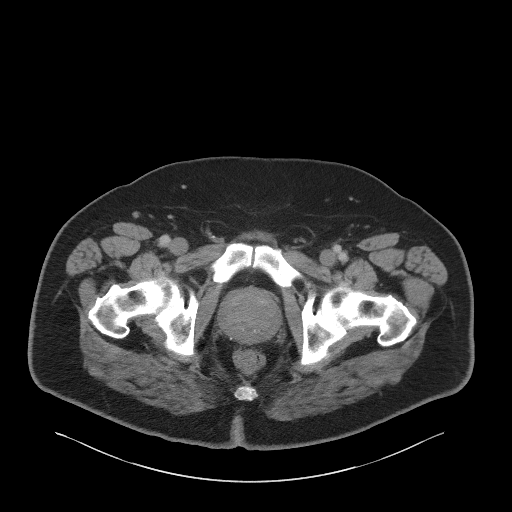
[im 42/73  soft-tissue]
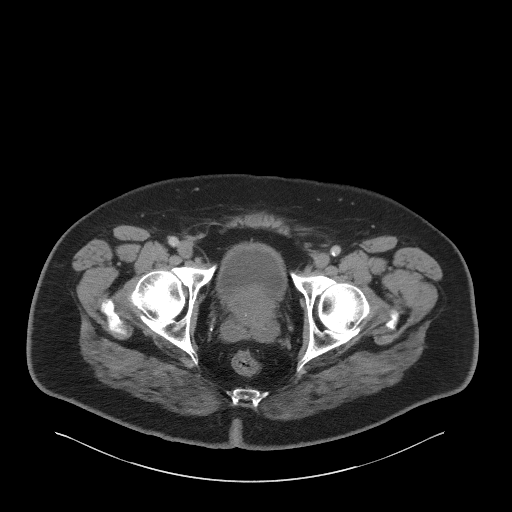
[im 47/73  soft-tissue]
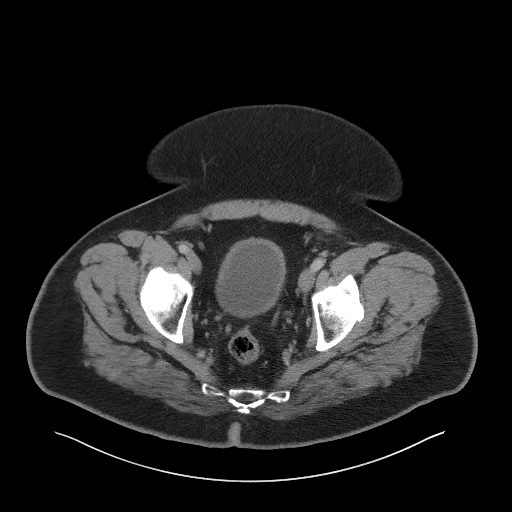
[im 47/73  bone]
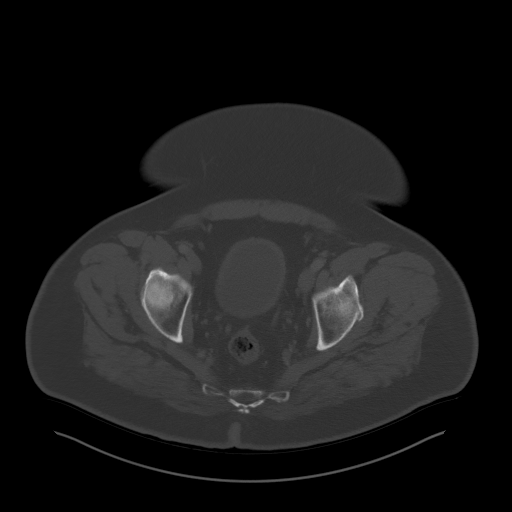
[im 52/73  soft-tissue]
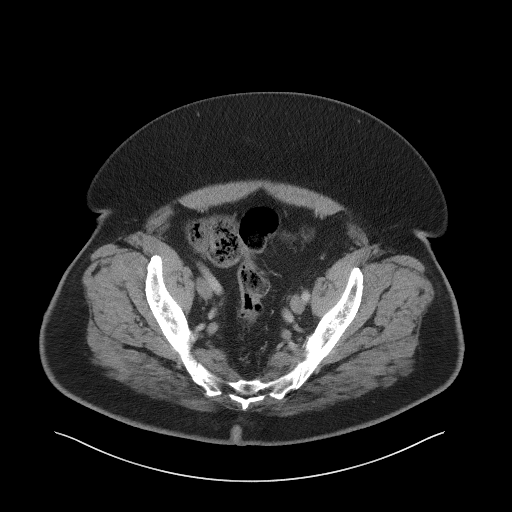
[im 59/73  soft-tissue]
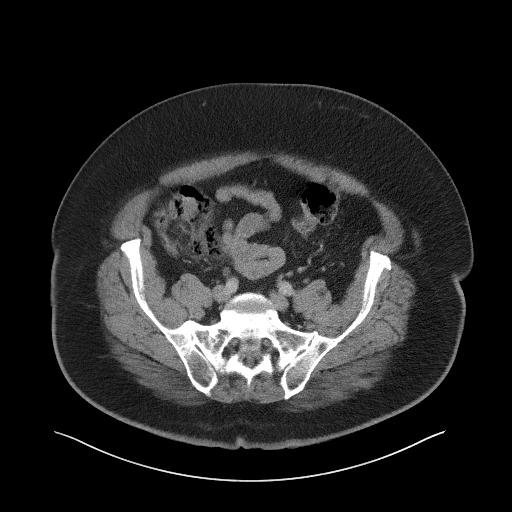
[im 63/73  soft-tissue]
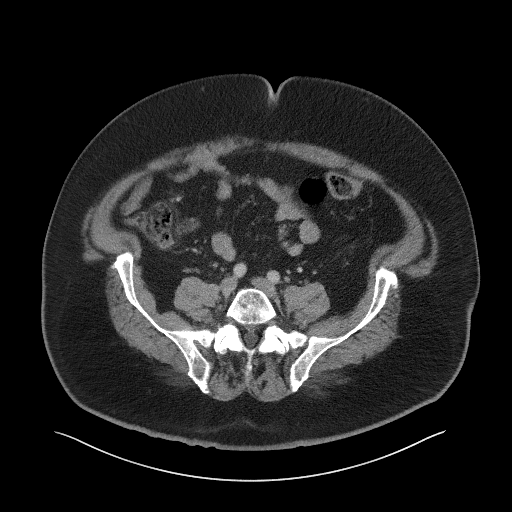
[im 68/73  soft-tissue]
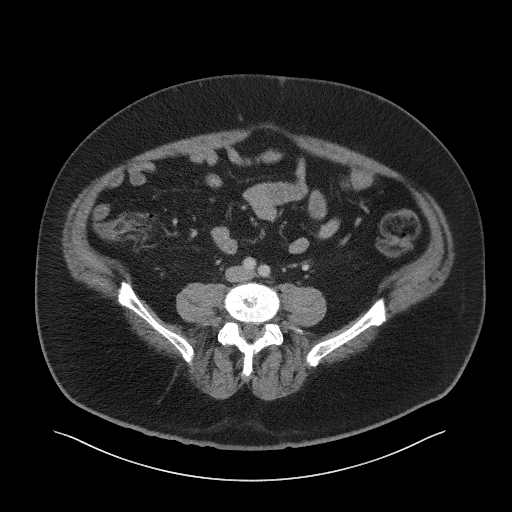

[Series 4: coronal st · coronal · 0.73mm/px · 3 of 118 slices shown]
[im 40/118  soft-tissue]
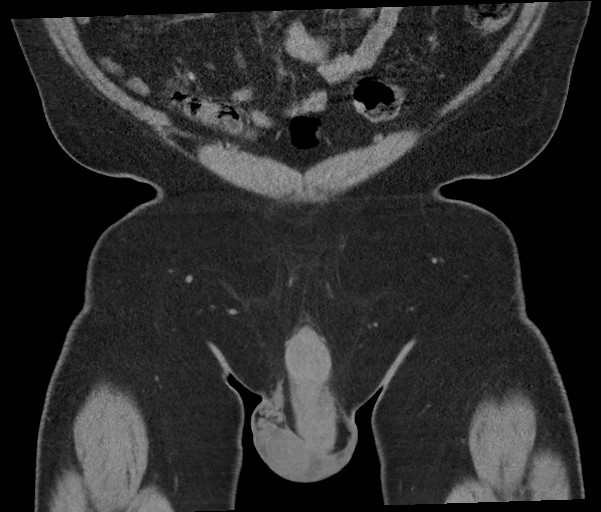
[im 53/118  soft-tissue]
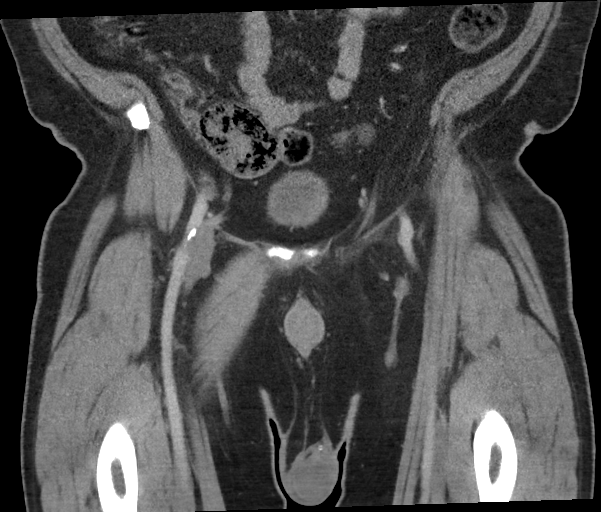
[im 66/118  soft-tissue]
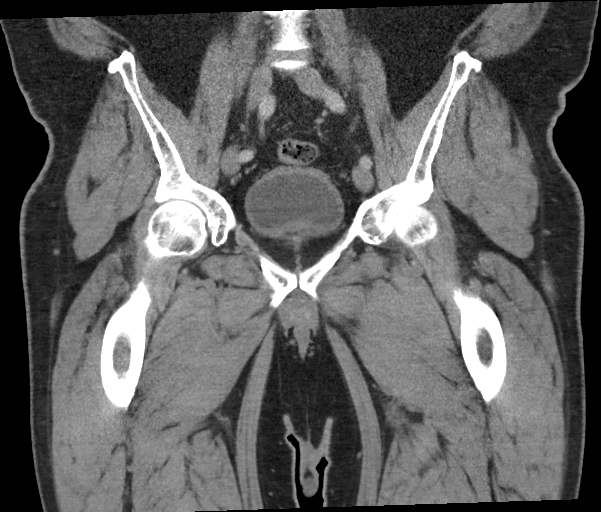

[16 of 46 positions shown; findings below may reference images not displayed]

FINDINGS: Urinary Tract: Diffuse bladder wall thickening. No bladder stones.
No focal bladder abnormality. Pelvic ureters are normal caliber. No
stones in the pelvic ureters.

Bowel:  No dilated or thick-walled bowel loops in the pelvis.

Vascular/Lymphatic: Mild iliofemoral atherosclerosis bilaterally. No
acute vascular abnormality. No pathologically enlarged pelvic lymph
nodes.

Reproductive: Moderately enlarged prostate. Prostate dimensions
x 5.5 x 6.1 cm (96 cc prostate volume). Punctate scattered
nonspecific internal prostatic calcifications. Seminal vesicles are
symmetric and within normal limits.

Other: No pelvic ascites, pneumoperitoneum or focal fluid
collection.

Musculoskeletal: No aggressive appearing focal osseous lesions.
Nonspecific subcentimeter sclerotic lesions in the medial right
iliac bone and right upper sacrum, most commonly due to benign bone
islands. Mild degenerative changes in the visualized lower lumbar
spine.
IMPRESSION: 1. Moderate prostatomegaly. Symmetric seminal vesicles are within
normal limits.
2. Diffuse bladder wall thickening is nonspecific, probably due to
chronic bladder outlet obstruction by the enlarged prostate. No
bladder stones.

## 2021-02-22 ENCOUNTER — Telehealth (INDEPENDENT_AMBULATORY_CARE_PROVIDER_SITE_OTHER): Payer: PPO | Admitting: Psychiatry

## 2021-02-22 ENCOUNTER — Encounter (HOSPITAL_COMMUNITY): Payer: Self-pay | Admitting: Psychiatry

## 2021-02-22 ENCOUNTER — Other Ambulatory Visit: Payer: Self-pay

## 2021-02-22 DIAGNOSIS — G4700 Insomnia, unspecified: Secondary | ICD-10-CM | POA: Diagnosis not present

## 2021-02-22 DIAGNOSIS — F39 Unspecified mood [affective] disorder: Secondary | ICD-10-CM | POA: Diagnosis not present

## 2021-02-22 DIAGNOSIS — F4010 Social phobia, unspecified: Secondary | ICD-10-CM | POA: Diagnosis not present

## 2021-02-22 MED ORDER — TOPIRAMATE 50 MG PO TABS: 150.0000 mg | ORAL_TABLET | Freq: Every day | ORAL | 0 refills | Status: DC

## 2021-02-22 MED ORDER — ESCITALOPRAM OXALATE 10 MG PO TABS: 10.0000 mg | ORAL_TABLET | Freq: Every day | ORAL | 0 refills | Status: DC

## 2021-02-22 MED ORDER — ZALEPLON 10 MG PO CAPS
10.0000 mg | ORAL_CAPSULE | Freq: Every evening | ORAL | 2 refills | Status: DC | PRN
Start: 2021-02-22 — End: 2021-04-01

## 2021-02-22 MED ORDER — LITHIUM CARBONATE ER 300 MG PO TBCR: 300.0000 mg | EXTENDED_RELEASE_TABLET | Freq: Every morning | ORAL | 0 refills | Status: DC

## 2021-02-22 NOTE — Progress Notes (Signed)
BH MD OP Progress Note  Virtual Visit via Telephone Note  I connected with Mario Knapp on 02/22/21 at  3:00 PM EDT by telephone and verified that I am speaking with the correct person using two identifiers.  Location: Patient: home Provider: Clinic   I discussed the limitations, risks, security and privacy concerns of performing an evaluation and management service by telephone and the availability of in person appointments. I also discussed with the patient that there may be a patient responsible charge related to this service. The patient expressed understanding and agreed to proceed.   I provided 13 minutes of non-face-to-face time during this encounter.    02/22/2021 3:00 PM Mario Knapp  MRN:  [id number]  Chief Complaint: " Things are pretty good."  HPI: Patient informed he is doing well.  He stated that his mood has been stable.  He is sleeping well at night without the Sonata.  He informed his family is doing well. He still has not had lithium levels checked despite the writer placing orders again January.  Writer reminded him again that there is a standing order for lithium levels to be checked and he should take care of this soon as possible.  Patient verbalizes understanding.  Visit Diagnosis:    ICD-10-CM   1. Episodic mood disorder (HCC)  F39 lithium carbonate (LITHOBID) 300 MG CR tablet    zaleplon (SONATA) 10 MG capsule    escitalopram (LEXAPRO) 10 MG tablet    topiramate (TOPAMAX) 50 MG tablet  2. Insomnia, unspecified type  G47.00 zaleplon (SONATA) 10 MG capsule  3. Social anxiety disorder  F40.10 escitalopram (LEXAPRO) 10 MG tablet    Past Psychiatric History: mood disorder, anxiety  Past Medical History:  Past Medical History:  Diagnosis Date  . Arthritis   . Depression   . Hypertension   . Sleep apnea     Past Surgical History:  Procedure Laterality Date  . BALLOON DILATION N/A 05/11/2016   Procedure: BALLOON DILATION;  Surgeon: Scot Jun, MD;  Location: [facility] ENDOSCOPY;  Service: Endoscopy;  Laterality: N/A;  . CHOLECYSTECTOMY    . ESOPHAGOGASTRODUODENOSCOPY N/A 04/15/2016   Procedure: ESOPHAGOGASTRODUODENOSCOPY (EGD) with removal of foreign bodu;  Surgeon: Midge Minium, MD;  Location: [facility] ENDOSCOPY;  Service: Endoscopy;  Laterality: N/A;  . ESOPHAGOGASTRODUODENOSCOPY (EGD) WITH PROPOFOL N/A 05/11/2016   Procedure: ESOPHAGOGASTRODUODENOSCOPY (EGD) WITH PROPOFOL;  Surgeon: Scot Jun, MD;  Location: [facility] ENDOSCOPY;  Service: Endoscopy;  Laterality: N/A;    Family Psychiatric History: see below  Family History:  Family History  Problem Relation Age of Onset  . Depression Mother   . Thyroid disease Mother   . Alcohol abuse Father   . Emphysema Father   . Breast cancer Sister        two times  . Depression Sister   . Alcohol abuse Brother   . Depression Sister     Social History:  Social History   Socioeconomic History  . Marital status: Married    Spouse name: peggy   . Number of children: 4  . Years of education: Not on file  . Highest education level: 9th grade  Occupational History  . Occupation: retired  Tobacco Use  . Smoking status: Former Smoker    Types: Cigarettes    Quit date: 02/25/1981    Years since quitting: 40.0  . Smokeless tobacco: Former Neurosurgeon    Quit date: 02/25/1981  Vaping Use  . Vaping Use: Never used  Substance and  Sexual Activity  . Alcohol use: No  . Drug use: No  . Sexual activity: Yes  Other Topics Concern  . Not on file  Social History Narrative  . Not on file   Social Determinants of Health   Financial Resource Strain: Not on file  Food Insecurity: Not on file  Transportation Needs: Not on file  Physical Activity: Not on file  Stress: Not on file  Social Connections: Not on file    Allergies: No Known Allergies  Metabolic Disorder Labs: Lab Results  Component Value Date   HGBA1C 5.4 02/17/2016   No results found for: PROLACTIN Lab Results   Component Value Date   CHOL 213 (H) 06/28/2012   TRIG 227 (H) 06/28/2012   HDL 35 (L) 06/28/2012   VLDL 45 (H) 06/28/2012   LDLCALC 133 (H) 06/28/2012   LDLCALC 118 (H) 12/22/2011   Lab Results  Component Value Date   TSH 2.997 02/17/2016    Therapeutic Level Labs: No results found for: LITHIUM No results found for: VALPROATE No components found for:  CBMZ  Current Medications: Current Outpatient Medications  Medication Sig Dispense Refill  . escitalopram (LEXAPRO) 10 MG tablet Take 1 tablet (10 mg total) by mouth daily. 90 tablet 0  . finasteride (PROSCAR) 5 MG tablet Take 1 tablet (5 mg total) by mouth daily. 90 tablet 3  . furosemide (LASIX) 20 MG tablet     . gabapentin (NEURONTIN) 300 MG capsule     . KLOR-CON M20 20 MEQ tablet Take 20 mEq by mouth daily.    Marland Kitchen lisinopril (ZESTRIL) 2.5 MG tablet Take by mouth.    . lithium carbonate (LITHOBID) 300 MG CR tablet Take 1 tablet (300 mg total) by mouth every morning. 90 tablet 0  . metFORMIN (GLUCOPHAGE) 500 MG tablet Take 500 mg by mouth 2 (two) times daily.    . Multiple Vitamin (MULTI-VITAMINS) TABS Take by mouth.    . Multiple Vitamin (MULTIVITAMIN WITH MINERALS) TABS tablet Take 1 tablet by mouth daily.    . pantoprazole (PROTONIX) 40 MG tablet TAKE 1 TABLET BY MOUTH EVERY DAY    . tamsulosin (FLOMAX) 0.4 MG CAPS capsule Take 0.4 mg by mouth daily after supper.    . topiramate (TOPAMAX) 50 MG tablet Take 3 tablets (150 mg total) by mouth daily. 270 tablet 0  . vitamin B-12 (CYANOCOBALAMIN) 1000 MCG tablet Take 1,000 mcg by mouth daily.    . zaleplon (SONATA) 10 MG capsule Take 1 capsule (10 mg total) by mouth at bedtime as needed for sleep. 30 capsule 2   No current facility-administered medications for this visit.     Musculoskeletal: Strength & Muscle Tone: unable to assess due to telemed visit Gait & Station: unable to assess due to telemed visit Patient leans: unable to assess due to telemed visit  Psychiatric  Specialty Exam: Review of Systems  There were no vitals taken for this visit.There is no height or weight on file to calculate BMI.  General Appearance: unable to assess due to phone visit  Eye Contact:  unable to assess due to phone visit  Speech:  Clear and Coherent and Normal Rate  Volume:  Normal  Mood:  Euthymic  Affect:  Congruent  Thought Process:  Goal Directed, Linear and Descriptions of Associations: Intact  Orientation:  Full (Time, Place, and Person)  Thought Content: Logical   Suicidal Thoughts:  No  Homicidal Thoughts:  No  Memory:  Recent;   Good Remote;  Good  Judgement:  Fair  Insight:  Fair  Psychomotor Activity:  Normal  Concentration:  Concentration: Good and Attention Span: Good  Recall:  Good  Fund of Knowledge: Good  Language: Good  Akathisia:  Negative  Handed:  Right  AIMS (if indicated): not done  Assets:  Communication Skills Desire for Improvement Financial Resources/Insurance Housing  ADL's:  Intact  Cognition: WNL  Sleep:  Good   Screenings: AIMS   Flowsheet Row Admission (Discharged) from 02/16/2016 in [facility] INPATIENT BEHAVIORAL MEDICINE  AIMS Total Score 0    AUDIT   Flowsheet Row Admission (Discharged) from 02/16/2016 in [facility] INPATIENT BEHAVIORAL MEDICINE  Alcohol Use Disorder Identification Test Final Score (AUDIT) 0       Assessment and Plan: Patient remains stable on his current regimen.  He was once again reminded that he needs to get his lithium level checked.  1. Episodic mood disorder (HCC)  - escitalopram (LEXAPRO) 10 MG tablet; Take 1 tablet (10 mg total) by mouth daily.  Dispense: 90 tablet; Refill: 1 - lithium carbonate (LITHOBID) 300 MG CR tablet; Take 1 tablet (300 mg total) by mouth every morning.  Dispense: 90 tablet; Refill: 1 - zaleplon (SONATA) 10 MG capsule; Take 1 capsule (10 mg total) by mouth at bedtime as needed for sleep.  Dispense: 30 capsule; Refill: 2 - topiramate (TOPAMAX) 50 MG tablet; Take 3 tablets (150  mg total) by mouth daily.  Dispense: 90 tablet; Refill: 1 - Lithium levels to be checked, order placed once again today.  2. Social anxiety disorder  - escitalopram (LEXAPRO) 10 MG tablet; Take 1 tablet (10 mg total) by mouth daily.  Dispense: 90 tablet; Refill: 1  3. Insomnia, unspecified type  - zaleplon (SONATA) 10 MG capsule; Take 1 capsule (10 mg total) by mouth at bedtime as needed for sleep.  Dispense: 30 capsule; Refill: 2  Continue same medication regimen. Follow up in 3 months. Patient was informed that his care is being transferred to a different provider in the [facility] psychiatry clinic.  Zena Amos, MD 02/22/2021, 3:00 PM

## 2021-03-18 DIAGNOSIS — E119 Type 2 diabetes mellitus without complications: Secondary | ICD-10-CM | POA: Diagnosis not present

## 2021-03-25 DIAGNOSIS — I700 Atherosclerosis of aorta: Secondary | ICD-10-CM | POA: Diagnosis not present

## 2021-03-25 DIAGNOSIS — I10 Essential (primary) hypertension: Secondary | ICD-10-CM | POA: Diagnosis not present

## 2021-03-25 DIAGNOSIS — E119 Type 2 diabetes mellitus without complications: Secondary | ICD-10-CM | POA: Diagnosis not present

## 2021-03-25 DIAGNOSIS — E782 Mixed hyperlipidemia: Secondary | ICD-10-CM | POA: Diagnosis not present

## 2021-03-25 DIAGNOSIS — Z6837 Body mass index (BMI) 37.0-37.9, adult: Secondary | ICD-10-CM | POA: Diagnosis not present

## 2021-03-25 DIAGNOSIS — F332 Major depressive disorder, recurrent severe without psychotic features: Secondary | ICD-10-CM | POA: Diagnosis not present

## 2021-03-25 DIAGNOSIS — N401 Enlarged prostate with lower urinary tract symptoms: Secondary | ICD-10-CM | POA: Diagnosis not present

## 2021-03-25 DIAGNOSIS — K219 Gastro-esophageal reflux disease without esophagitis: Secondary | ICD-10-CM | POA: Diagnosis not present

## 2021-03-25 DIAGNOSIS — E6609 Other obesity due to excess calories: Secondary | ICD-10-CM | POA: Diagnosis not present

## 2021-03-25 DIAGNOSIS — Z0000 Encounter for general adult medical examination without abnormal findings: Secondary | ICD-10-CM | POA: Diagnosis not present

## 2021-04-01 ENCOUNTER — Other Ambulatory Visit (HOSPITAL_COMMUNITY): Payer: Self-pay | Admitting: Psychiatry

## 2021-04-01 DIAGNOSIS — G4700 Insomnia, unspecified: Secondary | ICD-10-CM

## 2021-04-01 DIAGNOSIS — F39 Unspecified mood [affective] disorder: Secondary | ICD-10-CM

## 2021-04-09 DIAGNOSIS — E669 Obesity, unspecified: Secondary | ICD-10-CM | POA: Diagnosis not present

## 2021-04-09 DIAGNOSIS — G4733 Obstructive sleep apnea (adult) (pediatric): Secondary | ICD-10-CM | POA: Diagnosis not present

## 2021-04-16 NOTE — Progress Notes (Signed)
[facility] [address] [place], [place] [address]  Pulmonary Sleep Medicine   Office Visit Note  Patient Name: Mario Knapp DOB: 1946-04-24 MRN [id number]    Chief Complaint: Obstructive Sleep Apnea visit  Brief History:  Mario Knapp is seen today for initial PAP therapy consult  to establish care and is currently on BiPAP therapy, 17/13 cmH2O.   The patient has a 12+ history of sleep apnea. Patient is using PAP nightly.  The patient feels rested after sleeping with PAP.  The patient reports can't sleep without PAP use. Reported sleepiness is  improved and the Epworth Sleepiness Score is 5 out of 24. The patient takes weekly naps of 3 hours. The patient complains of the following: of no problems or concerns and states he cleans regularly.  The compliance download shows  compliance with an average use time of 10:38 hours. The AHI is 0.5 events per hour.  The patient does not complain of limb movements disrupting sleep.  ROS  General: (-) fever, (-) chills, (-) night sweat Nose and Sinuses: (-) nasal stuffiness or itchiness, (-) postnasal drip, (-) nosebleeds, (-) sinus trouble. Mouth and Throat: (-) sore throat, (-) hoarseness. Neck: (-) swollen glands, (-) enlarged thyroid, (-) neck pain. Respiratory: - cough, - shortness of breath, - wheezing. Neurologic: - numbness, - tingling. Psychiatric: + anxiety, + depression   Current Medication: Outpatient Encounter Medications as of 04/19/2021  Medication Sig   cyanocobalamin 1000 MCG tablet Take 1 tablet by mouth daily.   furosemide (LASIX) 20 MG tablet Take 1 tablet by mouth daily.   lithium carbonate (LITHOBID) 300 MG CR tablet Take by mouth.   metFORMIN (GLUCOPHAGE) 500 MG tablet Take 1 tablet by mouth 2 (two) times daily with a meal.   mirtazapine (REMERON) 15 MG tablet Take by mouth.   pantoprazole (PROTONIX) 40 MG tablet Take by mouth.   potassium chloride SA (KLOR-CON M20) 20 MEQ tablet Take 1 tablet by mouth  daily.   escitalopram (LEXAPRO) 10 MG tablet Take 1 tablet (10 mg total) by mouth daily.   finasteride (PROSCAR) 5 MG tablet Take 1 tablet (5 mg total) by mouth daily.   furosemide (LASIX) 20 MG tablet    gabapentin (NEURONTIN) 300 MG capsule    KLOR-CON M20 20 MEQ tablet Take 20 mEq by mouth daily.   lisinopril (ZESTRIL) 2.5 MG tablet Take by mouth.   lithium carbonate (LITHOBID) 300 MG CR tablet Take 1 tablet (300 mg total) by mouth every morning.   metFORMIN (GLUCOPHAGE) 500 MG tablet Take 500 mg by mouth 2 (two) times daily.   Multiple Vitamin (MULTI-VITAMINS) TABS Take by mouth.   Multiple Vitamin (MULTIVITAMIN WITH MINERALS) TABS tablet Take 1 tablet by mouth daily.   pantoprazole (PROTONIX) 40 MG tablet TAKE 1 TABLET BY MOUTH EVERY DAY   tamsulosin (FLOMAX) 0.4 MG CAPS capsule Take 0.4 mg by mouth daily after supper.   topiramate (TOPAMAX) 50 MG tablet Take 3 tablets (150 mg total) by mouth daily.   vitamin B-12 (CYANOCOBALAMIN) 1000 MCG tablet Take 1,000 mcg by mouth daily.   zaleplon (SONATA) 10 MG capsule TAKE 1 CAPSULE (10 MG TOTAL) BY MOUTH AT BEDTIME AS NEEDED FOR SLEEP.   No facility-administered encounter medications on file as of 04/19/2021.    Surgical History: Past Surgical History:  Procedure Laterality Date   BALLOON DILATION N/A 05/11/2016   Procedure: BALLOON DILATION;  Surgeon: Scot Jun, MD;  Location: [facility] ENDOSCOPY;  Service: Endoscopy;  Laterality: N/A;  CHOLECYSTECTOMY     ESOPHAGOGASTRODUODENOSCOPY N/A 04/15/2016   Procedure: ESOPHAGOGASTRODUODENOSCOPY (EGD) with removal of foreign bodu;  Surgeon: Midge Minium, MD;  Location: [facility] ENDOSCOPY;  Service: Endoscopy;  Laterality: N/A;   ESOPHAGOGASTRODUODENOSCOPY (EGD) WITH PROPOFOL N/A 05/11/2016   Procedure: ESOPHAGOGASTRODUODENOSCOPY (EGD) WITH PROPOFOL;  Surgeon: Scot Jun, MD;  Location: [facility] ENDOSCOPY;  Service: Endoscopy;  Laterality: N/A;    Medical History: Past Medical History:  Diagnosis  Date   Arthritis    Depression    Hypertension    Sleep apnea     Family History: Non contributory to the present illness  Social History: Social History   Socioeconomic History   Marital status: Married    Spouse name: peggy    Number of children: 4   Years of education: Not on file   Highest education level: 9th grade  Occupational History   Occupation: retired  Tobacco Use   Smoking status: Former    Pack years: 0.00    Types: Cigarettes    Quit date: 02/25/1981    Years since quitting: 40.1   Smokeless tobacco: Former    Quit date: 02/25/1981  Vaping Use   Vaping Use: Never used  Substance and Sexual Activity   Alcohol use: No   Drug use: No   Sexual activity: Yes  Other Topics Concern   Not on file  Social History Narrative   Not on file   Social Determinants of Health   Financial Resource Strain: Not on file  Food Insecurity: Not on file  Transportation Needs: Not on file  Physical Activity: Not on file  Stress: Not on file  Social Connections: Not on file  Intimate Partner Violence: Not on file    Vital Signs: Blood pressure (!) 147/73, pulse 82, resp. rate 18, height 5\' 10"  (1.778 m), weight 264 lb (119.7 kg), SpO2 98 %.  Examination: General Appearance: The patient is well-developed, well-nourished, and in no distress. Neck Circumference: 51 Skin: Gross inspection of skin unremarkable. Head: normocephalic, no gross deformities. Eyes: no gross deformities noted. ENT: ears appear grossly normal Neurologic: Alert and oriented. No involuntary movements.    EPWORTH SLEEPINESS SCALE:  Scale:  (0)= no chance of dozing; (1)= slight chance of dozing; (2)= moderate chance of dozing; (3)= high chance of dozing  Chance  Situtation    Sitting and reading: 1    Watching TV: 1    Sitting Inactive in public: 0    As a passenger in car: 0      Lying down to rest: 3    Sitting and talking: 0    Sitting quielty after lunch: 0    In a car,  stopped in traffic: 0   TOTAL SCORE:   5 out of 24    SLEEP STUDIES:  PSG - 07/03/09  AHI of 77 per hour, O2 desats to low 60s in REM, Low SpO2 50%     CPAP COMPLIANCE DATA:  Date Range: 04/15/20 - 04/14/21  Average Daily Use: 10:38 hours  Median Use: 10:03  Compliance for > 4 Hours: 100% days  AHI: 0.5 respiratory events per hour  Days Used: 365/365 days  Mask Leak: 1.7 Lpm  95th Percentile Pressure: 17/13 cmH2O         LABS: No results found for this or any previous visit (from the past 2160 hour(s)).  Radiology: CT Pelvis W Contrast  Result Date: 07/01/2019 CLINICAL DATA:  Persistent hematospermia for several months. No reported history of cancer. EXAM: CT  PELVIS WITH CONTRAST TECHNIQUE: Multidetector CT imaging of the pelvis was performed using the standard protocol following the bolus administration of intravenous contrast. CONTRAST:  OMNIPAQUE IOHEXOL 300 MG/ML  SOLN COMPARISON:  06/29/2008 CT abdomen/pelvis. FINDINGS: Urinary Tract: Diffuse bladder wall thickening. No bladder stones. No focal bladder abnormality. Pelvic ureters are normal caliber. No stones in the pelvic ureters. Bowel:  No dilated or thick-walled bowel loops in the pelvis. Vascular/Lymphatic: Mild iliofemoral atherosclerosis bilaterally. No acute vascular abnormality. No pathologically enlarged pelvic lymph nodes. Reproductive: Moderately enlarged prostate. Prostate dimensions 5.5 x 5.5 x 6.1 cm (96 cc prostate volume). Punctate scattered nonspecific internal prostatic calcifications. Seminal vesicles are symmetric and within normal limits. Other: No pelvic ascites, pneumoperitoneum or focal fluid collection. Musculoskeletal: No aggressive appearing focal osseous lesions. Nonspecific subcentimeter sclerotic lesions in the medial right iliac bone and right upper sacrum, most commonly due to benign bone islands. Mild degenerative changes in the visualized lower lumbar spine. IMPRESSION: 1.  Moderate prostatomegaly. Symmetric seminal vesicles are within normal limits. 2. Diffuse bladder wall thickening is nonspecific, probably due to chronic bladder outlet obstruction by the enlarged prostate. No bladder stones. Electronically Signed   By: Delbert Phenix M.D.   On: 07/01/2019 11:59    No results found.  No results found.    Assessment and Plan: Patient Active Problem List   Diagnosis Date Noted   OSA treated with BiPAP 04/19/2021   Obesity (BMI 30-39.9) 04/19/2021   Severe obesity (BMI >= 40) (HCC) 03/25/2021   Controlled type 2 diabetes mellitus without complication, without long-term current use of insulin (HCC) 04/22/2020   Erectile dysfunction due to arterial insufficiency 01/09/2020   Hematospermia 01/08/2020   Episodic mood disorder (HCC) 11/04/2019   Insomnia 10/29/2019   Fatty liver disease, nonalcoholic 06/26/2018   Esophageal foreign body    Stricture and stenosis of esophagus    Adiposity 03/29/2016   Major depressive disorder, recurrent episode (HCC) 03/01/2016   Social anxiety disorder 03/01/2016   B12 deficiency 02/26/2016   OSA (obstructive sleep apnea) 02/23/2016   Severe recurrent major depression without psychotic features (HCC) 02/16/2016   BPH without obstruction/lower urinary tract symptoms 02/16/2016   Depression 07/01/2014   Osteoarthritis 06/11/2014   Hypertension 06/11/2014   Hyperlipidemia, unspecified 06/11/2014   1. OSA treated with BiPAP The patient does tolerate PAP and reports definite benefit from PAP use. The patient was reminded how to clean machine  and advised to replace supplies routinely. The patient was also counselled on weight loss. The compliance is excellent. The AHI is 0.5. OSA- continue excellent compliance with bipap. F/u in one year  2. Obesity (BMI 30-39.9) Obesity Counseling: Had a lengthy discussion regarding patients BMI and weight issues. Patient was instructed on portion control as well as increased activity. Also  discussed caloric restrictions with trying to maintain intake less than 2000 Kcal. Discussions were made in accordance with the 5As of weight management. Simple actions such as not eating late and if able to, taking a walk is suggested.   3. Hypertension, unspecified type Hypertension Counseling:   The following hypertensive lifestyle modification were recommended and discussed:  1. Limiting alcohol intake to less than 1 oz/day of ethanol:(24 oz of beer or 8 oz of wine or 2 oz of 100-proof whiskey). 2. Take baby ASA 81 mg daily. 3. Importance of regular aerobic exercise and losing weight. 4. Reduce dietary saturated fat and cholesterol intake for overall cardiovascular health. 5. Maintaining adequate dietary potassium, calcium, and magnesium intake. 6.  Regular monitoring of the blood pressure. 7. Reduce sodium intake to less than 100 mmol/day (less than 2.3 gm of sodium or less than 6 gm of sodium choride)      General Counseling: I have discussed the findings of the evaluation and examination with Mario Knapp.  I have also discussed any further diagnostic evaluation thatmay be needed or ordered today. Lewie verbalizes understanding of the findings of todays visit. We also reviewed his medications today and discussed drug interactions and side effects including but not limited excessive drowsiness and altered mental states. We also discussed that there is always a risk not just to him but also people around him. he has been encouraged to call the office with any questions or concerns that should arise related to todays visit.  No orders of the defined types were placed in this encounter.       I have personally obtained a history, examined the patient, evaluated laboratory and imaging results, formulated the assessment and plan and placed orders.   This patient was seen today by Emmaline KluverSarah Terrell, PA-C in collaboration with Dr. Freda MunroSaadat Milyn Stapleton.    Yevonne PaxSaadat A Myles Tavella, MD [facility] Diplomate ABMS Pulmonary and  Critical Care Medicine Sleep medicine

## 2021-04-19 ENCOUNTER — Ambulatory Visit (INDEPENDENT_AMBULATORY_CARE_PROVIDER_SITE_OTHER): Payer: PPO | Admitting: Internal Medicine

## 2021-04-19 VITALS — BP 147/73 | HR 82 | Resp 18 | Ht 70.0 in | Wt 264.0 lb

## 2021-04-19 DIAGNOSIS — E669 Obesity, unspecified: Secondary | ICD-10-CM | POA: Diagnosis not present

## 2021-04-19 DIAGNOSIS — G4733 Obstructive sleep apnea (adult) (pediatric): Secondary | ICD-10-CM | POA: Insufficient documentation

## 2021-04-19 DIAGNOSIS — I10 Essential (primary) hypertension: Secondary | ICD-10-CM | POA: Diagnosis not present

## 2021-04-19 NOTE — Patient Instructions (Signed)

## 2021-05-19 NOTE — Progress Notes (Signed)
BH MD/PA/NP OP Progress Note  05/24/2021 3:58 PM Mario Knapp  MRN:  [id number]  Chief Complaint:  Chief Complaint   Follow-up; Depression    HPI:  Mario Knapp is a 75 y.o. year old male with a history of depression, hypertension, type II diabetes, sleep apnea with BiPAP,  who is transferred from Dr. Evelene Croon.   He states that he has been seen by doctors for depression.  He states that he has been doing "not bad."  He does not do much of things during the day.  However on further elaboration, he states that he takes care of her 75-year-old grandson during the day.  He enjoys taking care of him.  He also enjoys mowing yard weekly.  Although he used to go to gym, he has not done sounds since he moved from a pain in 2019 to be closer to his grandson. [place]  He reports fair relationship with his wife.  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  His mother died last year at age 42; he states that he had no regret, stating that he and his siblings took care of her alternately.   He has depressive symptoms as in PHQ-9.  He denies any change in appetite, and feels good about weight loss since being mindful of his diet. He denies decreased need for sleep or euphonia, increased goal directed activity.  He denies any hallucinations.  He denies any trauma history.   Substance- he denies alcohol use or drug use  Medication- lithium 300 mg, lexapro 10 mg daily, topiramate 150 mg daily, zaleplon 10 mg at night   Daily routine: four year old grandson Exercise: none Employment: retired, used to work at [employer], plant closed in 2008. On social security Support: sister, wife Household: wife Marital status: married for 42 years, married three times Number of children: 3 from previous relationship. 1 killed in MVA He states that he has good childhood.  His mother had "favorite "and he tried  not to be around with his father when he is drunk.  He denies any abuse from him.  He enjoyed staying at his mother and grandfather's.   Wt Readings from Last 3 Encounters:  05/24/21 263 lb (119.3 kg)  04/19/21 264 lb (119.7 kg)  01/11/21 268 lb (121.6 kg)     Visit Diagnosis:    ICD-10-CM   1. MDD (major depressive disorder), recurrent episode, mild (HCC)  F33.0 TSH    Lithium level      Past Psychiatric History:   Outpatient: depression since child Psychiatry admission:  3 times, last in 2018 Previous suicide attempt: denies   Past trials of medication: he does not recall. Per chart, mirtazapine, Abilify History of violence:  denies   Past Medical History:  Past Medical History:  Diagnosis Date   Arthritis    Depression    Hypertension    Sleep apnea     Past Surgical History:  Procedure Laterality Date   BALLOON DILATION N/A 05/11/2016   Procedure: BALLOON DILATION;  Surgeon: Scot Jun, MD;  Location: [facility] ENDOSCOPY;  Service: Endoscopy;  Laterality: N/A;   CHOLECYSTECTOMY     ESOPHAGOGASTRODUODENOSCOPY N/A 04/15/2016   Procedure: ESOPHAGOGASTRODUODENOSCOPY (EGD) with removal of foreign bodu;  Surgeon: Midge Minium, MD;  Location: [facility] ENDOSCOPY;  Service: Endoscopy;  Laterality: N/A;   ESOPHAGOGASTRODUODENOSCOPY (EGD) WITH PROPOFOL N/A 05/11/2016   Procedure:  ESOPHAGOGASTRODUODENOSCOPY (EGD) WITH PROPOFOL;  Surgeon: Scot Jun, MD;  Location[facility] ENDOSCOPY;  Service: Endoscopy;  Laterality: N/A;    Family Psychiatric History:  As below  Family History:  Family History  Problem Relation Age of Onset   Depression Mother    Thyroid disease Mother    Alcohol abuse Father    Emphysema Father    Breast cancer Sister        two times   Depression Sister    Depression Sister    Alcohol abuse Brother    Alcohol abuse Paternal Aunt     Social History:  Social History   Socioeconomic History   Marital status: Married    Spouse name: peggy    Number of  children: 4   Years of education: Not on file   Highest education level: 9th grade  Occupational History   Occupation: retired  Tobacco Use   Smoking status: Former    Types: Cigarettes    Quit date: 02/25/1981    Years since quitting: 40.2   Smokeless tobacco: Former    Quit date: 02/25/1981  Vaping Use   Vaping Use: Never used  Substance and Sexual Activity   Alcohol use: No   Drug use: No   Sexual activity: Yes  Other Topics Concern   Not on file  Social History Narrative   Not on file   Social Determinants of Health   Financial Resource Strain: Not on file  Food Insecurity: Not on file  Transportation Needs: Not on file  Physical Activity: Not on file  Stress: Not on file  Social Connections: Not on file    Allergies: No Known Allergies  Metabolic Disorder Labs: Lab Results  Component Value Date   HGBA1C 5.4 02/17/2016   No results found for: PROLACTIN Lab Results  Component Value Date   CHOL 213 (H) 06/28/2012   TRIG 227 (H) 06/28/2012   HDL 35 (L) 06/28/2012   VLDL 45 (H) 06/28/2012   LDLCALC 133 (H) 06/28/2012   LDLCALC 118 (H) 12/22/2011   Lab Results  Component Value Date   TSH 2.997 02/17/2016    Therapeutic Level Labs: No results found for: LITHIUM No results found for: VALPROATE No components found for:  CBMZ  Current Medications: Current Outpatient Medications  Medication Sig Dispense Refill   escitalopram (LEXAPRO) 10 MG tablet Take 1 tablet (10 mg total) by mouth daily. 90 tablet 0   finasteride (PROSCAR) 5 MG tablet Take 1 tablet (5 mg total) by mouth daily. 90 tablet 3   furosemide (LASIX) 20 MG tablet Take 1 tablet by mouth daily.     gabapentin (NEURONTIN) 300 MG capsule      KLOR-CON M20 20 MEQ tablet Take 20 mEq by mouth daily.     lisinopril (ZESTRIL) 2.5 MG tablet Take by mouth.     lithium carbonate (LITHOBID) 300 MG CR tablet Take 1 tablet (300 mg total) by mouth every morning. 90 tablet 0   metFORMIN (GLUCOPHAGE) 500 MG  tablet Take 500 mg by mouth 2 (two) times daily.     mirtazapine (REMERON) 15 MG tablet Take by mouth.     Multiple Vitamin (MULTI-VITAMINS) TABS Take by mouth.     Multiple Vitamin (MULTIVITAMIN WITH MINERALS) TABS tablet Take 1 tablet by mouth daily.     pantoprazole (PROTONIX) 40 MG tablet TAKE 1 TABLET BY MOUTH EVERY DAY     tamsulosin (FLOMAX) 0.4 MG CAPS capsule Take 0.4 mg by mouth daily after supper.  topiramate (TOPAMAX) 50 MG tablet Take 3 tablets (150 mg total) by mouth daily. 270 tablet 0   vitamin B-12 (CYANOCOBALAMIN) 1000 MCG tablet Take 1,000 mcg by mouth daily.     zaleplon (SONATA) 10 MG capsule TAKE 1 CAPSULE (10 MG TOTAL) BY MOUTH AT BEDTIME AS NEEDED FOR SLEEP. 30 capsule 1   No current facility-administered medications for this visit.     Musculoskeletal: Strength & Muscle Tone:  N/A Gait & Station:  N/A Patient leans: N/A  Psychiatric Specialty Exam: Review of Systems  Psychiatric/Behavioral:  Positive for dysphoric mood. Negative for agitation, behavioral problems, confusion, decreased concentration, hallucinations, self-injury, sleep disturbance and suicidal ideas. The patient is not nervous/anxious and is not hyperactive.   All other systems reviewed and are negative.  Blood pressure (!) 138/91, pulse 79, temperature (!) 97.3 F (36.3 C), temperature source Temporal, weight 263 lb (119.3 kg).Body mass index is 37.74 kg/m.  General Appearance: Fairly Groomed  Eye Contact:  Good  Speech:  Clear and Coherent  Volume:  Normal  Mood:  Depressed  Affect:  Appropriate, Congruent, and calm  Thought Process:  Coherent  Orientation:  Full (Time, Place, and Person)  Thought Content: Logical   Suicidal Thoughts:  No  Homicidal Thoughts:  No  Memory:  Immediate;   Good  Judgement:  Good  Insight:  Good  Psychomotor Activity:  Normal  Concentration:  Concentration: Good and Attention Span: Good  Recall:  Good  Fund of Knowledge: Good  Language: Good   Akathisia:  No  Handed:  Right  AIMS (if indicated): not done  Assets:  Communication Skills Desire for Improvement  ADL's:  Intact  Cognition: WNL  Sleep:  Good   Screenings: AIMS    Flowsheet Row Admission (Discharged) from 02/16/2016 in [facility]PATIENT BE[facility]AV[facility]AL MEDICINE  AIMS Total Score 0      AUDIT    Flowsheet Row Admission (Discharged) from 02/16/2016 in [facility] INPATIENT BEHAVIORAL MEDICINE  Alcohol Use Disorder Identification Test Final Score (AUDIT) 0      PHQ2-9    Flowsheet Row Office Visit from 05/24/2021 in [facility]  PHQ-2 Total Score 3  PHQ-9 Total Score 8      Flowsheet Row Office Visit from 05/24/2021 in [facility]  C-SSRS RISK CATEGORY Error: Q3, 4, or 5 should not be populated when Q2 is No        Assessment and Plan:  Mario Knapp is a 75 y.o. year old male with a history of depression, hypertension, type II diabetes, sleep apnea with BiPAP,  who is transferred from Dr. Evelene Croon.   1. MDD (major depressive disorder), recurrent episode, mild (HCC) He reports mild depressive symptoms in the context of unemployment and financial strain.  Other psychosocial stressors includes loss of his son from MVA at age 3  he enjoys taking care of his grandson, and reports good connection with his family.  Will continue Lexapro to target depression.  Noted that he has been on lithium; he denies any chronic SI prior to start this medication, and is unsure of its indication.  Will consider discontinue this medication to avoid potential side effect after he discusses this with his wife.  Will obtain blood level to monitor lithium toxicity.   # Binge eating He denies any recent episode of binge eating.  Also will continue topiramate at this time, will consider discontinuation of this medication after he discusses this with his wife to avoid polypharmacy.   #Hypertension  He was found to have hypertension on  exam . He is advised to be followed by his primary care provider.   Plan Continue lithium 300 mg daily (will order after lithium level is obtained) Continue lexapro 10 mg daily Continue topiramate 150 mg daily Obtain lithium level, TSH Next appointment- 8/18 at 8 Am for 30 mins, video (his wife's phone- [phone]) - on zaleplon 10 mg at night as needed for insomnia  The patient demonstrates the following risk factors for suicide: Chronic risk factors for suicide include: psychiatric disorder of depression . Acute risk factors for suicide include: N/A. Protective factors for this patient include: positive social support and hope for the future. Considering these factors, the overall suicide risk at this point appears to be low. Patient is appropriate for outpatient follow up.   The duration of this appointment visit was 35 minutes of face-to-face time with the patient.  Greater than 50% of this time was spent in counseling, explanation of  diagnosis, planning of further management, and coordination of care.   Neysa Hottereina Nathali Vent, MD 05/24/2021, 3:58 PM

## 2021-05-24 ENCOUNTER — Other Ambulatory Visit: Payer: Self-pay

## 2021-05-24 ENCOUNTER — Encounter: Payer: Self-pay | Admitting: Psychiatry

## 2021-05-24 ENCOUNTER — Ambulatory Visit (INDEPENDENT_AMBULATORY_CARE_PROVIDER_SITE_OTHER): Payer: PPO | Admitting: Psychiatry

## 2021-05-24 ENCOUNTER — Other Ambulatory Visit
Admission: RE | Admit: 2021-05-24 | Discharge: 2021-05-24 | Disposition: A | Discharge status: Home / Self Care | Payer: PPO | Attending: Psychiatry | Admitting: Psychiatry

## 2021-05-24 VITALS — BP 138/91 | HR 79 | Temp 97.3°F | Wt 263.0 lb

## 2021-05-24 DIAGNOSIS — F330 Major depressive disorder, recurrent, mild: Secondary | ICD-10-CM

## 2021-05-24 DIAGNOSIS — F4010 Social phobia, unspecified: Secondary | ICD-10-CM | POA: Diagnosis not present

## 2021-05-24 DIAGNOSIS — F39 Unspecified mood [affective] disorder: Secondary | ICD-10-CM | POA: Diagnosis not present

## 2021-05-24 LAB — LITHIUM LEVEL: Lithium Lvl: 0.32 mmol/L — ABNORMAL LOW (ref 0.60–1.20)

## 2021-05-24 LAB — TSH: TSH: 2.059 u[IU]/mL (ref 0.350–4.500)

## 2021-05-24 MED ORDER — TOPIRAMATE 50 MG PO TABS: 150.0000 mg | ORAL_TABLET | Freq: Every day | ORAL | 0 refills | Status: DC

## 2021-05-24 MED ORDER — ESCITALOPRAM OXALATE 10 MG PO TABS: 10.0000 mg | ORAL_TABLET | Freq: Every day | ORAL | 0 refills | Status: DC

## 2021-05-25 ENCOUNTER — Encounter: Payer: Self-pay | Admitting: Psychiatry

## 2021-06-22 NOTE — Progress Notes (Signed)
Virtual Visit via Telephone Note  I connected with Mario Knapp on 06/24/21 at  8:00 AM EDT by telephone and verified that I am speaking with the correct person using two identifiers.  Location: Patient: home Provider: office Persons participated in the visit- patient, provider    I discussed the limitations, risks, security and privacy concerns of performing an evaluation and management service by telephone and the availability of in person appointments. I also discussed with the patient that there may be a patient responsible charge related to this service. The patient expressed understanding and agreed to proceed.     I discussed the assessment and treatment plan with the patient. The patient was provided an opportunity to ask questions and all were answered. The patient agreed with the plan and demonstrated an understanding of the instructions.   The patient was advised to call back or seek an in-person evaluation if the symptoms worsen or if the condition fails to improve as anticipated.  I provided 15 minutes of non-face-to-face time during this encounter.   Neysa Hotter, MD     [facility] MD/PA/NP OP Progress Note  06/24/2021 8:26 AM Mario Knapp  MRN:  [id number]  Chief Complaint:  Chief Complaint   Follow-up; Other    HPI:  This is a follow-up appointment for depression.  He states that he has been doing good.  He has not been able to see his 75 year old grandson as his mother does not allow to.  He feels disappointed by this.  He continues to see his 75-year-old grandson, who visits his place every day.  He enjoys a time with them.  When he is asked about the paranoia, which was reported by his wife, he states that he feels he is not good as they are as he does not have enough money.  He denies any paranoia that people were trying to harm him.  He denies any hallucinations.  He denies feeling depressed or anxiety.  He has good appetite.  He denies any binge eating.   He sleeps well.  He denies SI.  He denies decreased need for sleep or euphonia.   His wife presents to the interview.  She feels good with discontinuation of lithium.  She is not aware of him having any manic episode.  She states that he has "paranoia "that he thinks everybody does not like him.  She denies any other concerns.   Daily routine: hang out with his wife, 75 yo grandson Exercise: none Employment: retired, used to work at [employer], plant closed in 2008. On social security Support: sister, wife Household: wife Marital status: married for 42 years, married three times Number of children: 3 from previous relationship. 1 killed in MVA He states that he has good childhood.  His mother had "favorite "and he tried not to be around with his father when he is drunk.  He denies any abuse from him.  He enjoyed staying at his mother and grandfather's.   Visit Diagnosis:    ICD-10-CM   1. MDD (major depressive disorder), recurrent, in partial remission (HCC)  F33.41       Past Psychiatric History: Please see initial evaluation for full details. I have reviewed the history. No updates at this time.     Past Medical History:  Past Medical History:  Diagnosis Date   Arthritis    Depression    Hypertension    Sleep apnea     Past Surgical History:  Procedure Laterality Date  BALLOON DILATION N/A 05/11/2016   Procedure: BALLOON DILATION;  Surgeon: Scot Jun, MD;  Location: [facility] ENDOSCOPY;  Service: Endoscopy;  Laterality: N/A;   CHOLECYSTECTOMY     ESOPHAGOGASTRODUODENOSCOPY N/A 04/15/2016   Procedure: ESOPHAGOGASTRODUODENOSCOPY (EGD) with removal of foreign bodu;  Surgeon: Midge Minium, MD;  Location: [facility] ENDOSCOPY;  Service: Endoscopy;  Laterality: N/A;   ESOPHAGOGASTRODUODENOSCOPY (EGD) WITH PROPOFOL N/A 05/11/2016   Procedure: ESOPHAGOGASTRODUODENOSCOPY (EGD) WITH PROPOFOL;  Surgeon: Scot Jun, MD;  Location: [facility] ENDOSCOPY;  Service: Endoscopy;  Laterality: N/A;     Family Psychiatric History: Please see initial evaluation for full details. I have reviewed the history. No updates at this time.     Family History:  Family History  Problem Relation Age of Onset   Depression Mother    Thyroid disease Mother    Alcohol abuse Father    Emphysema Father    Breast cancer Sister        two times   Depression Sister    Depression Sister    Alcohol abuse Brother    Alcohol abuse Paternal Aunt     Social History:  Social History   Socioeconomic History   Marital status: Married    Spouse name: peggy    Number of children: 4   Years of education: Not on file   Highest education level: 9th grade  Occupational History   Occupation: retired  Tobacco Use   Smoking status: Former    Types: Cigarettes    Quit date: 02/25/1981    Years since quitting: 40.3   Smokeless tobacco: Former    Quit date: 02/25/1981  Vaping Use   Vaping Use: Never used  Substance and Sexual Activity   Alcohol use: No   Drug use: No   Sexual activity: Yes  Other Topics Concern   Not on file  Social History Narrative   Not on file   Social Determinants of Health   Financial Resource Strain: Not on file  Food Insecurity: Not on file  Transportation Needs: Not on file  Physical Activity: Not on file  Stress: Not on file  Social Connections: Not on file    Allergies: No Known Allergies  Metabolic Disorder Labs: Lab Results  Component Value Date   HGBA1C 5.4 02/17/2016   No results found for: PROLACTIN Lab Results  Component Value Date   CHOL 213 (H) 06/28/2012   TRIG 227 (H) 06/28/2012   HDL 35 (L) 06/28/2012   VLDL 45 (H) 06/28/2012   LDLCALC 133 (H) 06/28/2012   LDLCALC 118 (H) 12/22/2011   Lab Results  Component Value Date   TSH 2.059 05/24/2021   TSH 2.997 02/17/2016    Therapeutic Level Labs: Lab Results  Component Value Date   LITHIUM 0.32 (L) 05/24/2021   No results found for: VALPROATE No components found for:  CBMZ  Current  Medications: Current Outpatient Medications  Medication Sig Dispense Refill   escitalopram (LEXAPRO) 10 MG tablet Take 1 tablet (10 mg total) by mouth daily. 90 tablet 0   finasteride (PROSCAR) 5 MG tablet Take 1 tablet (5 mg total) by mouth daily. 90 tablet 3   furosemide (LASIX) 20 MG tablet Take 1 tablet by mouth daily.     KLOR-CON M20 20 MEQ tablet Take 20 mEq by mouth daily.     lisinopril (ZESTRIL) 2.5 MG tablet Take by mouth.     metFORMIN (GLUCOPHAGE) 500 MG tablet Take 500 mg by mouth 2 (two) times daily.  Multiple Vitamin (MULTI-VITAMINS) TABS Take by mouth.     Multiple Vitamin (MULTIVITAMIN WITH MINERALS) TABS tablet Take 1 tablet by mouth daily.     pantoprazole (PROTONIX) 40 MG tablet TAKE 1 TABLET BY MOUTH EVERY DAY     tamsulosin (FLOMAX) 0.4 MG CAPS capsule Take 0.4 mg by mouth daily after supper.     topiramate (TOPAMAX) 50 MG tablet Take 3 tablets (150 mg total) by mouth daily. 270 tablet 0   vitamin B-12 (CYANOCOBALAMIN) 1000 MCG tablet Take 1,000 mcg by mouth daily.     zaleplon (SONATA) 10 MG capsule TAKE 1 CAPSULE (10 MG TOTAL) BY MOUTH AT BEDTIME AS NEEDED FOR SLEEP. 30 capsule 1   No current facility-administered medications for this visit.     Musculoskeletal: Strength & Muscle Tone:  N/A Gait & Station:  N/A Patient leans: N/A  Psychiatric Specialty Exam: Review of Systems  Psychiatric/Behavioral:  Negative for agitation, behavioral problems, confusion, decreased concentration, dysphoric mood, hallucinations, self-injury, sleep disturbance and suicidal ideas. The patient is not nervous/anxious and is not hyperactive.   All other systems reviewed and are negative.  There were no vitals taken for this visit.There is no height or weight on file to calculate BMI.  General Appearance: NA  Eye Contact:  NA  Speech:  Clear and Coherent  Volume:  Normal  Mood:   fine  Affect:  NA  Thought Process:  Coherent  Orientation:  Full (Time, Place, and Person)   Thought Content: Logical   Suicidal Thoughts:  No  Homicidal Thoughts:  No  Memory:  Immediate;   Good  Judgement:  Good  Insight:  Fair  Psychomotor Activity:  Normal  Concentration:  Concentration: Good and Attention Span: Good  Recall:  Good  Fund of Knowledge: Good  Language: Good  Akathisia:  No  Handed:  Right  AIMS (if indicated): not done  Assets:  Communication Skills Desire for Improvement  ADL's:  Intact  Cognition: WNL  Sleep:  Fair   Screenings: AIMS    Flowsheet Row Ad[facility]Dis[facility]) [facility]  AIMS Total Score 0      AUDIT    Flowsheet Row [facility]  Alcohol Use Disorder Identification Test Final Score (AUDIT) 0      PHQ2-9    Flowsheet Row Office Visit from 05/24/2021 [facility]  PHQ-2 Total Score 3  PHQ-9 Total Score 8      Flowsheet Row [facility] Visit from 05[facility]24/20[facility] in [facility]  C-SSRS RISK CATEGORY Error: Q3, 4, or 5 should not be populated when Q2 is No        Assessment and Plan:  Mario Knapp is a 75 y.o. year old male with a history of depression, hypertension, type II diabetes, sleep apnea with BiPAP, who presents for follow up appointment for below.   1. MDD (major depressive disorder), recurrent, in partial remission (HCC) He denies significant mood symptoms since the last visit except cognitive distortion of inadequacy.  Psychosocial stressors includes unemployment, financial strain, and conflict with the mother of his grandson.   Other psychosocial stressors includes loss of his son from MVA at age 75.  he enjoys taking care of his grandson, and reports good connection with his family.  Will continue Lexapro to target depression.  Will discontinue lithium given no clear indication of its use; he denies any manic symptoms nor chronic SI in the past.  Both  the patient and his wife agrees with the plan.  They are advised to contact the office if any worsening in his symptoms.   # Binge eating He denies any recent episode of binge eating.  We will continue topiramate at this time; will consider discontinuation of this medication if he denies any binge eating episodes to avoid polypharmacy.   Plan (he has not filled the prescription from this provider.  He is advised to contact the clinic if he is additional refill.) Discontinue lithium (was on 300 mg) Continue lexapro 10 mg daily  Continue topiramate 150 mg daily Continue zaleplon 10 mg at night as needed for insomnia- he has refills from other provider Next appointment- 11/14 at 11 AM , video  (his wife's phone- [phone])    The patient demonstrates the following risk factors for suicide: Chronic risk factors for suicide include: psychiatric disorder of depression . Acute risk factors for suicide include: N/A. Protective factors for this patient include: positive social support and hope for the future. Considering these factors, the overall suicide risk at this point appears to be low. Patient is appropriate for outpatient follow up.    The duration of this appointment visit was 35 minutes of face-to-face time with the patient.  Greater than 50% of this time was spent in counseling, explanation of  diagnosis, planning of further management, and coordination of care.         Neysa Hotter, MD 06/24/2021, 8:26 AM

## 2021-06-24 ENCOUNTER — Encounter: Payer: Self-pay | Admitting: Psychiatry

## 2021-06-24 ENCOUNTER — Telehealth (INDEPENDENT_AMBULATORY_CARE_PROVIDER_SITE_OTHER): Payer: PPO | Admitting: Psychiatry

## 2021-06-24 ENCOUNTER — Other Ambulatory Visit: Payer: Self-pay

## 2021-06-24 DIAGNOSIS — F3341 Major depressive disorder, recurrent, in partial remission: Secondary | ICD-10-CM | POA: Diagnosis not present

## 2021-07-05 ENCOUNTER — Other Ambulatory Visit (HOSPITAL_COMMUNITY): Payer: Self-pay | Admitting: Psychiatry

## 2021-07-05 DIAGNOSIS — F39 Unspecified mood [affective] disorder: Secondary | ICD-10-CM

## 2021-07-05 DIAGNOSIS — G4700 Insomnia, unspecified: Secondary | ICD-10-CM

## 2021-07-06 ENCOUNTER — Telehealth: Payer: Self-pay

## 2021-07-06 ENCOUNTER — Other Ambulatory Visit: Payer: Self-pay | Admitting: Psychiatry

## 2021-07-06 DIAGNOSIS — G4700 Insomnia, unspecified: Secondary | ICD-10-CM

## 2021-07-06 DIAGNOSIS — F39 Unspecified mood [affective] disorder: Secondary | ICD-10-CM

## 2021-07-06 MED ORDER — ZALEPLON 10 MG PO CAPS: 10.0000 mg | ORAL_CAPSULE | Freq: Every evening | ORAL | 2 refills | Status: DC | PRN

## 2021-07-06 NOTE — Telephone Encounter (Signed)
Medication refill - Call from pt's wife stating pt is in need of a new Zaleplon order. Pt seen by Dr. Vanetta Shawl 06/24/21. Verified with Joe, pharmacist at pt's [facility] he has no refills remaining. Last filled from their prescriptions 04/07/21, 05/05/21, and 06/03/21.

## 2021-07-06 NOTE — Telephone Encounter (Signed)
Ordered

## 2021-07-06 NOTE — Telephone Encounter (Signed)
Medication management - Telephone call with pt's wife to inform Dr. Vanetta Shawl had sent in requested Sonata refill order.

## 2021-07-07 NOTE — Telephone Encounter (Signed)
Message acknowledged and reviewed by provider.

## 2021-07-08 NOTE — Telephone Encounter (Signed)
Message acknowledged and reviewed by provider.

## 2021-07-20 DIAGNOSIS — N401 Enlarged prostate with lower urinary tract symptoms: Secondary | ICD-10-CM | POA: Diagnosis not present

## 2021-07-20 DIAGNOSIS — Z125 Encounter for screening for malignant neoplasm of prostate: Secondary | ICD-10-CM | POA: Diagnosis not present

## 2021-07-20 DIAGNOSIS — E119 Type 2 diabetes mellitus without complications: Secondary | ICD-10-CM | POA: Diagnosis not present

## 2021-07-27 DIAGNOSIS — E538 Deficiency of other specified B group vitamins: Secondary | ICD-10-CM | POA: Diagnosis not present

## 2021-07-27 DIAGNOSIS — Z0001 Encounter for general adult medical examination with abnormal findings: Secondary | ICD-10-CM | POA: Diagnosis not present

## 2021-07-27 DIAGNOSIS — Z23 Encounter for immunization: Secondary | ICD-10-CM | POA: Diagnosis not present

## 2021-07-27 DIAGNOSIS — E119 Type 2 diabetes mellitus without complications: Secondary | ICD-10-CM | POA: Diagnosis not present

## 2021-07-27 DIAGNOSIS — F332 Major depressive disorder, recurrent severe without psychotic features: Secondary | ICD-10-CM | POA: Diagnosis not present

## 2021-07-27 DIAGNOSIS — N401 Enlarged prostate with lower urinary tract symptoms: Secondary | ICD-10-CM | POA: Diagnosis not present

## 2021-07-27 DIAGNOSIS — I10 Essential (primary) hypertension: Secondary | ICD-10-CM | POA: Diagnosis not present

## 2021-07-27 DIAGNOSIS — E782 Mixed hyperlipidemia: Secondary | ICD-10-CM | POA: Diagnosis not present

## 2021-07-27 DIAGNOSIS — K760 Fatty (change of) liver, not elsewhere classified: Secondary | ICD-10-CM | POA: Diagnosis not present

## 2021-07-27 DIAGNOSIS — K219 Gastro-esophageal reflux disease without esophagitis: Secondary | ICD-10-CM | POA: Diagnosis not present

## 2021-07-27 DIAGNOSIS — G4733 Obstructive sleep apnea (adult) (pediatric): Secondary | ICD-10-CM | POA: Diagnosis not present

## 2021-09-15 NOTE — Progress Notes (Signed)
Virtual Visit via Video Note  I connected with Mario Knapp on 09/20/21 at 11:00 AM EST by a video enabled telemedicine application and verified that I am speaking with the correct person using two identifiers.  Location: Patient: home Provider: office Persons participated in the visit- patient, provider    I discussed the limitations of evaluation and management by telemedicine and the availability of in person appointments. The patient expressed understanding and agreed to proceed.   I discussed the assessment and treatment plan with the patient. The patient was provided an opportunity to ask questions and all were answered. The patient agreed with the plan and demonstrated an understanding of the instructions.   The patient was advised to call back or seek an in-person evaluation if the symptoms worsen or if the condition fails to improve as anticipated.  I provided 15 minutes of non-face-to-face time during this encounter.   Neysa Hotter, MD      [facility] MD/PA/NP OP Progress Note  09/20/2021 11:29 AM Mario Knapp  MRN:  [id number]  Chief Complaint:  Chief Complaint   Depression; Follow-up    HPI:  This is a follow-up appointment for depression and insomnia.  He states that he has been doing well.  He brings kids to school, and [place]  He states that he will have the first great-grandson in a few weeks.  He feels good about this.  Although he feels depressed without any reason at times, he has been able to do things well.  He has not noticed any change since discontinuation of lithium.  He sleeps well.  He denies anhedonia.  He has fair energy.  He has good concentration.  He denies any binge eating or change in appetite.  He denies SI.  He feels comfortable to stay on the medication as it is.   Daily routine: hang out with his wife, 52 yo grandson Exercise: none Employment: retired, used to work at [employer], plant closed in 2008. On  social security Support: sister, wife Household: wife Marital status: married for 42 years, married three times Number of children: 3 from previous relationship. 1 killed in MVA He states that he has good childhood.  His mother had "favorite "and he tried not to be around with his father when he is drunk.  He denies any abuse from him.  He enjoyed staying at his mother and grandfather's.   Wt Readings from Last 3 Encounters:  05/24/21 263 lb (119.3 kg)  04/19/21 264 lb (119.7 kg)  01/11/21 268 lb (121.6 kg)     Visit Diagnosis:    ICD-10-CM   1. MDD (major depressive disorder), recurrent, in partial remission (HCC)  F33.41     2. Episodic mood disorder (HCC)  F39 topiramate (TOPAMAX) 50 MG tablet    zaleplon (SONATA) 10 MG capsule    3. Insomnia, unspecified type  G47.00 zaleplon (SONATA) 10 MG capsule      Past Psychiatric History: Please see initial evaluation for full details. I have reviewed the history. No updates at this time.    Past Medical History:  Past Medical History:  Diagnosis Date   Arthritis    Depression    Hypertension    Sleep apnea     Past Surgical History:  Procedure Laterality Date   BALLOON DILATION N/A 05/11/2016   Procedure: BALLOON DILATION;  Surgeon: Scot Jun, MD;  Location: [facility] ENDOSCOPY;  Service: Endoscopy;  Laterality: N/A;   CHOLECYSTECTOMY  ESOPHAGOGASTRODUODENOSCOPY N/A 04/15/2016   Procedure: ESOPHAGOGASTRODUODENOSCOPY (EGD) with removal of foreign bodu;  Surgeon: Midge Minium, MD;  Location: [facility] ENDOSCOPY;  Service: Endoscopy;  Laterality: N/A;   ESOPHAGOGASTRODUODENOSCOPY (EGD) WITH PROPOFOL N/A 05/11/2016   Procedure: ESOPHAGOGASTRODUODENOSCOPY (EGD) WITH PROPOFOL;  Surgeon: Scot Jun, MD;  Location: [facility] ENDOSCOPY;  Service: Endoscopy;  Laterality: N/A;    Family Psychiatric History: Please see initial evaluation for full details. I have reviewed the history. No updates at this time.     Family History:  Family  History  Problem Relation Age of Onset   Depression Mother    Thyroid disease Mother    Alcohol abuse Father    Emphysema Father    Breast cancer Sister        two times   Depression Sister    Depression Sister    Alcohol abuse Brother    Alcohol abuse Paternal Aunt     Social History:  Social History   Socioeconomic History   Marital status: Married    Spouse name: peggy    Number of children: 4   Years of education: Not on file   Highest education level: 9th grade  Occupational History   Occupation: retired  Tobacco Use   Smoking status: Former    Types: Cigarettes    Quit date: 02/25/1981    Years since quitting: 40.5   Smokeless tobacco: Former    Quit date: 02/25/1981  Vaping Use   Vaping Use: Never used  Substance and Sexual Activity   Alcohol use: No   Drug use: No   Sexual activity: Yes  Other Topics Concern   Not on file  Social History Narrative   Not on file   Social Determinants of Health   Financial Resource Strain: Not on file  Food Insecurity: Not on file  Transportation Needs: Not on file  Physical Activity: Not on file  Stress: Not on file  Social Connections: Not on file    Allergies: No Known Allergies  Metabolic Disorder Labs: Lab Results  Component Value Date   HGBA1C 5.4 02/17/2016   No results found for: PROLACTIN Lab Results  Component Value Date   CHOL 213 (H) 06/28/2012   TRIG 227 (H) 06/28/2012   HDL 35 (L) 06/28/2012   VLDL 45 (H) 06/28/2012   LDLCALC 133 (H) 06/28/2012   LDLCALC 118 (H) 12/22/2011   Lab Results  Component Value Date   TSH 2.059 05/24/2021   TSH 2.997 02/17/2016    Therapeutic Level Labs: Lab Results  Component Value Date   LITHIUM 0.32 (L) 05/24/2021   No results found for: VALPROATE No components found for:  CBMZ  Current Medications: Current Outpatient Medications  Medication Sig Dispense Refill   escitalopram (LEXAPRO) 10 MG tablet Take 1 tablet (10 mg total) by mouth daily. 90 tablet  0   finasteride (PROSCAR) 5 MG tablet Take 1 tablet (5 mg total) by mouth daily. 90 tablet 3   furosemide (LASIX) 20 MG tablet Take 1 tablet by mouth daily.     KLOR-CON M20 20 MEQ tablet Take 20 mEq by mouth daily.     lisinopril (ZESTRIL) 2.5 MG tablet Take by mouth.     metFORMIN (GLUCOPHAGE) 500 MG tablet Take 500 mg by mouth 2 (two) times daily.     Multiple Vitamin (MULTI-VITAMINS) TABS Take by mouth.     Multiple Vitamin (MULTIVITAMIN WITH MINERALS) TABS tablet Take 1 tablet by mouth daily.     pantoprazole (PROTONIX) 40 MG  tablet TAKE 1 TABLET BY MOUTH EVERY DAY     tamsulosin (FLOMAX) 0.4 MG CAPS capsule Take 0.4 mg by mouth daily after supper.     [START ON 10/09/2021] topiramate (TOPAMAX) 50 MG tablet Take 3 tablets (150 mg total) by mouth daily. 270 tablet 0   vitamin B-12 (CYANOCOBALAMIN) 1000 MCG tablet Take 1,000 mcg by mouth daily.     [START ON 10/04/2021] zaleplon (SONATA) 10 MG capsule Take 1 capsule (10 mg total) by mouth at bedtime as needed for sleep. 30 capsule 2   No current facility-administered medications for this visit.     Musculoskeletal: Strength & Muscle Tone:  N/A Gait & Station:  N/A Patient leans: N/A  Psychiatric Specialty Exam: Review of Systems  Psychiatric/Behavioral:  Positive for dysphoric mood. Negative for agitation, behavioral problems, confusion and decreased concentration.   All other systems reviewed and are negative.  There were no vitals taken for this visit.There is no height or weight on file to calculate BMI.  General Appearance: Fairly Groomed  Eye Contact:  Good  Speech:  Clear and Coherent  Volume:  Normal  Mood:   good  Affect:  Appropriate, Congruent, and euthymic  Thought Process:  Coherent  Orientation:  Full (Time, Place, and Person)  Thought Content: Logical   Suicidal Thoughts:  No  Homicidal Thoughts:  No  Memory:  Immediate;   Good  Judgement:  Good  Insight:  Good  Psychomotor Activity:  Normal  Concentration:   Concentration: Good and Attention Span: Good  Recall:  Good  Fund of Knowledge: Good  Language: Good  Akathisia:  No  Handed:  Right  AIMS (if indicated): not done  Assets:  Communication Skills Desire for Improvement  ADL's:  Intact  Cognition: WNL  Sleep:  Good   Screenings: AIMS    Flowsheet Row Admission (Discharged) from 02/16/2016 in [facility] INPATIENT BEHAVIORAL MEDICINE  AIMS Total Score 0      AUDIT    Flowsheet Row Admission (Discharged) from 02/16/2016 in [facility] INPATIENT BEHAV[facility]AL MEDICINE  Alcohol Use Disorder Identification Test Final Score (AUDIT) 0      PHQ2-9    Flowsheet Row Video Visit from 09/20/2021 in [facility] Office Visit from 05/24/2021 in [facility]  PHQ-2 Total Score 1 3  PHQ-9 Total Score -- 8      Flowsheet Row Video Visit from 09/20/2021 in [facility] Office Visit from 05/24/2021 in [facility]  C-SSRS RISK CATEGORY No Risk Error: Q3, 4, or 5 should not be populated when Q2 is No        Assessment and Plan:  Mario Knapp is a 75 y.o. year old male with a history of  depression, hypertension, type II diabetes, sleep apnea with BiPAP, who presents for follow up appointment for below.    3. MDD (major depressive disorder), recurrent, in partial remission (HCC) There has been overall improvement in his mood symptoms, which coincided with upcoming birth of his great grandson.  Psychosocial stressors includes unemployment, financial strain, and conflict with the mother of his grandson, loss of his son from MVA at age 52. he enjoys taking care of his grandson, and reports good connection with his family.  Will continue Lexapro to target depression.  Noted that he was able to discontinue lithium without any issues.   1. Episodic mood disorder (HCC) He denies any episodes of binge eating.  Although it was discussed to try lowering  the  dose, he prefers to stay on the current dose.  Will continue topiramate to target binge eating.   2. Insomnia, unspecified type He reports good benefit from zaleplon.  Will continue current dose to target insomnia.    Plan  Continue lexapro 10 mg daily -filled last week for 90 days Continue topiramate 150 mg daily  Continue zaleplon 10 mg at night as needed for insomnia- he has refills from other provider Next appointment- 2/2 at 11 AM , video  (his wife's phone- [phone])     The patient demonstrates the following risk factors for suicide: Chronic risk factors for suicide include: psychiatric disorder of depression . Acute risk factors for suicide include: N/A. Protective factors for this patient include: positive social support and hope for the future. Considering these factors, the overall suicide risk at this point appears to be low. Patient is appropriate for outpatient follow up.         Neysa Hotter, MD 09/20/2021, 11:29 AM

## 2021-09-20 ENCOUNTER — Telehealth (INDEPENDENT_AMBULATORY_CARE_PROVIDER_SITE_OTHER): Payer: PPO | Admitting: Psychiatry

## 2021-09-20 ENCOUNTER — Other Ambulatory Visit: Payer: Self-pay

## 2021-09-20 ENCOUNTER — Encounter: Payer: Self-pay | Admitting: Psychiatry

## 2021-09-20 DIAGNOSIS — G4700 Insomnia, unspecified: Secondary | ICD-10-CM | POA: Diagnosis not present

## 2021-09-20 DIAGNOSIS — F3341 Major depressive disorder, recurrent, in partial remission: Secondary | ICD-10-CM | POA: Diagnosis not present

## 2021-09-20 DIAGNOSIS — F39 Unspecified mood [affective] disorder: Secondary | ICD-10-CM | POA: Diagnosis not present

## 2021-09-20 MED ORDER — ZALEPLON 10 MG PO CAPS: 10.0000 mg | ORAL_CAPSULE | Freq: Every evening | ORAL | 2 refills | Status: DC | PRN

## 2021-09-20 MED ORDER — TOPIRAMATE 50 MG PO TABS: 150.0000 mg | ORAL_TABLET | Freq: Every day | ORAL | 0 refills | Status: DC

## 2021-09-20 NOTE — Patient Instructions (Signed)
Continue lexapro 10 mg daily -filled last week for 90 days Continue topiramate 150 mg daily  Continue zaleplon 10 mg at night as needed for insomnia Next appointment- 2/2 at 11 AM

## 2021-10-26 ENCOUNTER — Other Ambulatory Visit: Payer: Self-pay | Admitting: Urology

## 2021-11-23 DIAGNOSIS — E119 Type 2 diabetes mellitus without complications: Secondary | ICD-10-CM | POA: Diagnosis not present

## 2021-11-30 DIAGNOSIS — F5101 Primary insomnia: Secondary | ICD-10-CM | POA: Diagnosis not present

## 2021-11-30 DIAGNOSIS — Z0000 Encounter for general adult medical examination without abnormal findings: Secondary | ICD-10-CM | POA: Diagnosis not present

## 2021-11-30 DIAGNOSIS — F332 Major depressive disorder, recurrent severe without psychotic features: Secondary | ICD-10-CM | POA: Diagnosis not present

## 2021-11-30 DIAGNOSIS — E782 Mixed hyperlipidemia: Secondary | ICD-10-CM | POA: Diagnosis not present

## 2021-11-30 DIAGNOSIS — E119 Type 2 diabetes mellitus without complications: Secondary | ICD-10-CM | POA: Diagnosis not present

## 2021-11-30 DIAGNOSIS — G4733 Obstructive sleep apnea (adult) (pediatric): Secondary | ICD-10-CM | POA: Diagnosis not present

## 2021-11-30 DIAGNOSIS — I10 Essential (primary) hypertension: Secondary | ICD-10-CM | POA: Diagnosis not present

## 2021-11-30 DIAGNOSIS — K219 Gastro-esophageal reflux disease without esophagitis: Secondary | ICD-10-CM | POA: Diagnosis not present

## 2021-11-30 DIAGNOSIS — K760 Fatty (change of) liver, not elsewhere classified: Secondary | ICD-10-CM | POA: Diagnosis not present

## 2021-12-03 DIAGNOSIS — E119 Type 2 diabetes mellitus without complications: Secondary | ICD-10-CM | POA: Diagnosis not present

## 2021-12-06 NOTE — Progress Notes (Signed)
Virtual Visit via Video Note  I connected with Mario Knapp on 12/09/21 at 11:00 AM EST by a video enabled telemedicine application and verified that I am speaking with the correct person using two identifiers.  Location: Patient: home Provider: office Persons participated in the visit- patient, provider    I discussed the limitations of evaluation and management by telemedicine and the availability of in person appointments. The patient expressed understanding and agreed to proceed.    I discussed the assessment and treatment plan with the patient. The patient was provided an opportunity to ask questions and all were answered. The patient agreed with the plan and demonstrated an understanding of the instructions.   The patient was advised to call back or seek an in-person evaluation if the symptoms worsen or if the condition fails to improve as anticipated.  I provided 15 minutes of non-face-to-face time during this encounter.   Neysa Hotter, MD    [facility] MD/PA/NP OP Progress Note  12/09/2021 11:27 AM Mario Knapp  MRN:  [id number]  Chief Complaint:  Chief Complaint   Follow-up; Depression    HPI:  This is a follow-up appointment for depression.  He states that he has been doing fair.  He was able to meet with his son in [place] around Christmas.  He now has a new great grandson, who is 61 months old.  He has been doing well.  Although he occasionally feels down at times without any triggers, he has been always being able to be out of bed, and taking his grandchildren to school.  Although he has not done much exercise, he agrees to go outside when the weather gets better.  He sleeps 9 hours.  He denies anhedonia.  He denies change in appetite or binge eating.  He has fair focus and energy.  He denies SI.  He denies anxiety.  He is willing to lower the dose of topiramate at this time.   Daily routine: hang out with his wife, taking his grandchildren to school (48, 11  yo grandson) Exercise: none Employment: retired, used to work at [employer], plant closed in 2008. On social security Support: sister, wife Household: wife Marital status: married for 42 years, married three times Number of children: 3 from previous relationship. 1 killed in MVA He states that he has good childhood.  His mother had "favorite "and he tried not to be around with his father when he is drunk.  He denies any abuse from him.  He enjoyed staying at his mother and grandfather's.   Visit Diagnosis:    ICD-10-CM   1. Binge eating disorder  F50.81     2. MDD (major depressive disorder), recurrent, in partial remission (HCC)  F33.41 zaleplon (SONATA) 10 MG capsule    3. Insomnia, unspecified type  G47.00 zaleplon (SONATA) 10 MG capsule      Past Psychiatric History: Please see initial evaluation for full details. I have reviewed the history. No updates at this time.     Past Medical History:  Past Medical History:  Diagnosis Date   Arthritis    Depression    Hypertension    Sleep apnea     Past Surgical History:  Procedure Laterality Date   BALLOON DILATION N/A 05/11/2016   Procedure: BALLOON DILATION;  Surgeon: Scot Jun, MD;  Location: [facility] ENDOSCOPY;  Service: Endoscopy;  Laterality: N/A;   CHOLECYSTECTOMY     ESOPHAGOGASTRODUODENOSCOPY N/A 04/15/2016   Procedure: ESOPHAGOGASTRODUODENOSCOPY (EGD) with removal of foreign  bodu;  Surgeon: Midge Miniumarren Wohl, MD;  [facility];  Service: Endoscopy;  Laterality: N/A;   ESOPHAGOGASTRODUODENOSCOPY (EGD) WITH PROPOFOL N/A 05/11/2016   Procedure: ESOPHAGOGASTRODUODENOSCOPY (EGD) WITH PROPOFOL;  Surgeon: Scot Junobert T Elliott, MD;  [facility];  Service: Endoscopy;  Laterality: N/A;    Family Psychiatric History: Please see initial evaluation for full details. I have reviewed the history. No updates at this time.     Family History:  Family History  Problem Relation Age of Onset   Depression Mother     Thyroid disease Mother    Alcohol abuse Father    Emphysema Father    Breast cancer Sister        two times   Depression Sister    Depression Sister    Alcohol abuse Brother    Alcohol abuse Paternal Aunt     Social History:  Social History   Socioeconomic History   Marital status: Married    Spouse name: peggy    Number of children: 4   Years of education: Not on file   Highest education level: 9th grade  Occupational History   Occupation: retired  Tobacco Use   Smoking status: Former    Types: Cigarettes    Quit date: 02/25/1981    Years since quitting: 40.8   Smokeless tobacco: Former    Quit date: 02/25/1981  Vaping Use   Vaping Use: Never used  Substance and Sexual Activity   Alcohol use: No   Drug use: No   Sexual activity: Yes  Other Topics Concern   Not on file  Social History Narrative   Not on file   Social Determinants of Health   Financial Resource Strain: Not on file  Food Insecurity: Not on file  Transportation Needs: Not on file  Physical Activity: Not on file  Stress: Not on file  Social Connections: Not on file    Allergies: No Known Allergies  Metabolic Disorder Labs: Lab Results  Component Value Date   HGBA1C 5.4 02/17/2016   No results found for: PROLACTIN Lab Results  Component Value Date   CHOL 213 (H) 06/28/2012   TRIG 227 (H) 06/28/2012   HDL 35 (L) 06/28/2012   VLDL 45 (H) 06/28/2012   LDLCALC 133 (H) 06/28/2012   LDLCALC 118 (H) 12/22/2011   Lab Results  Component Value Date   TSH 2.059 05/24/2021   TSH 2.997 02/17/2016    Therapeutic Level Labs: Lab Results  Component Value Date   LITHIUM 0.32 (L) 05/24/2021   No results found for: VALPROATE No components found for:  CBMZ  Current Medications: Current Outpatient Medications  Medication Sig Dispense Refill   escitalopram (LEXAPRO) 10 MG tablet Take 1 tablet (10 mg total) by mouth daily. 90 tablet 0   finasteride (PROSCAR) 5 MG tablet TAKE 1 TABLET (5 MG  TOTAL) BY MOUTH DAILY. 90 tablet 3   furosemide (LASIX) 20 MG tablet Take 1 tablet by mouth daily.     KLOR-CON M20 20 MEQ tablet Take 20 mEq by mouth daily.     lisinopril (ZESTRIL) 2.5 MG tablet Take by mouth.     metFORMIN (GLUCOPHAGE) 500 MG tablet Take 500 mg by mouth 2 (two) times daily.     Multiple Vitamin (MULTI-VITAMINS) TABS Take by mouth.     Multiple Vitamin (MULTIVITAMIN WITH MINERALS) TABS tablet Take 1 tablet by mouth daily.     pantoprazole (PROTONIX) 40 MG tablet TAKE 1 TABLET BY MOUTH EVERY DAY  tamsulosin (FLOMAX) 0.4 MG CAPS capsule Take 0.4 mg by mouth daily after supper.     topiramate (TOPAMAX) 50 MG tablet Take 3 tablets (150 mg total) by mouth daily. 270 tablet 0   vitamin B-12 (CYANOCOBALAMIN) 1000 MCG tablet Take 1,000 mcg by mouth daily.     [START ON 01/03/2022] zaleplon (SONATA) 10 MG capsule Take 1 capsule (10 mg total) by mouth at bedtime as needed for sleep. 30 capsule 2   No current facility-administered medications for this visit.     Musculoskeletal: Strength & Muscle Tone:  N/A Gait & Station:  N/A Patient leans: N/A  Psychiatric Specialty Exam: Review of Systems  Psychiatric/Behavioral:  Positive for dysphoric mood. Negative for agitation, behavioral problems, confusion, decreased concentration, hallucinations, self-injury, sleep disturbance and suicidal ideas. The patient is not nervous/anxious and is not hyperactive.   All other systems reviewed and are negative.  There were no vitals taken for this visit.There is no height or weight on file to calculate BMI.  General Appearance: Fairly Groomed  Eye Contact:  Good  Speech:  Clear and Coherent  Volume:  Normal  Mood:   good  Affect:  Appropriate, Congruent, and euthymic  Thought Process:  Coherent  Orientation:  Full (Time, Place, and Person)  Thought Content: Logical   Suicidal Thoughts:  No  Homicidal Thoughts:  No  Memory:  Immediate;   Good  Judgement:  Good  Insight:  Good   Psychomotor Activity:  Normal  Concentration:  Concentration: Good and Attention Span: Good  Recall:  Good  Fund of Knowledge: Good  Language: Good  Akathisia:  No  Handed:  Right  AIMS (if indicated): not done  Assets:  Communication Skills Desire for Improvement  ADL's:  Intact  Cognition: WNL  Sleep:  Fair   Screenings: AIMS    Flowsheet Row Ad[facility]Discharged) [facility]  AIMS Total Score 0      AUDIT    Flowsheet Row [facility]  Alcohol Use Disorder Identification Test Final Score (AUDIT) 0      PHQ2-9    Flowsheet Row Video Visit from 12/09/2021 in [facility] Video Visit from [facility]/20/2021 in [facility] Office Visit from [facility]/24/2021 [facility]  PHQ-2 Total Score 1 1 3   PHQ-9 Total Score -- -- 8      Flowsheet Row Video Visit [facility]2021 [facility] Office Visit [facility]20[facility]  C-SSRS RISK CATEGORY No Risk Error: Q3, 4, or 5 should not be populated when Q2 is No        Assessment and Plan:  Mario Knapp is a 76 y.o. year old male with a history of depression, hypertension, type II diabetes, sleep apnea with BiPAP, who presents for follow up appointment for below.   1. MDD (major depressive disorder), recurrent, in partial remission (HCC) Although he reports occasional depressed mood, it is self-limiting, and he denies any significant mood symptoms.  Psychosocial stressors includes unemployment, financial strain, and conflict with the mother of his grandson, loss of his son from MVA at age 76. He enjoys connection with his family and is willing to work on daily exercise.  Will continue Lexapro to target depression.   2. Insomnia, unspecified type Improving.  He reports good benefit  from zaleplon.  Will continue current dose to target insomnia.   3. Binge eating disorder  He denies any episodes of binge eating.  Will lower the dose of topiramate to avoid polypharmacy.  He was advised to contact the office if any worsening in his symptoms.    Plan  Continue lexapro 10 mg daily (he has enough refills at home) Decrease topiramate 100 mg daily  Continue zaleplon 10 mg at night as needed for insomnia-  Next appointment- 3/3 at 10:30 , video  (his wife's phone- [phone])     The patient demonstrates the following risk factors for suicide: Chronic risk factors for suicide include: psychiatric disorder of depression . Acute risk factors for suicide include: N/A. Protective factors for this patient include: positive social support and hope for the future. Considering these factors, the overall suicide risk at this point appears to be low. Patient is appropriate for outpatient follow up.       Neysa Hotter, MD 12/09/2021, 11:27 AM

## 2021-12-09 ENCOUNTER — Encounter: Payer: Self-pay | Admitting: Psychiatry

## 2021-12-09 ENCOUNTER — Telehealth (INDEPENDENT_AMBULATORY_CARE_PROVIDER_SITE_OTHER): Payer: PPO | Admitting: Psychiatry

## 2021-12-09 ENCOUNTER — Other Ambulatory Visit: Payer: Self-pay

## 2021-12-09 DIAGNOSIS — G4700 Insomnia, unspecified: Secondary | ICD-10-CM | POA: Diagnosis not present

## 2021-12-09 DIAGNOSIS — F5081 Binge eating disorder: Secondary | ICD-10-CM

## 2021-12-09 DIAGNOSIS — F3341 Major depressive disorder, recurrent, in partial remission: Secondary | ICD-10-CM | POA: Diagnosis not present

## 2021-12-09 MED ORDER — ZALEPLON 10 MG PO CAPS: 10.0000 mg | ORAL_CAPSULE | Freq: Every evening | ORAL | 2 refills | Status: DC | PRN

## 2021-12-09 NOTE — Patient Instructions (Signed)
Continue lexapro 10 mg daily  Decrease topiramate 100 mg daily  Continue zaleplon 10 mg at night as needed for insomnia-  Next appointment- 3/3 at 10:30

## 2022-01-02 ENCOUNTER — Other Ambulatory Visit: Payer: Self-pay | Admitting: Psychiatry

## 2022-01-02 DIAGNOSIS — F3341 Major depressive disorder, recurrent, in partial remission: Secondary | ICD-10-CM

## 2022-01-02 DIAGNOSIS — G4700 Insomnia, unspecified: Secondary | ICD-10-CM

## 2022-01-05 NOTE — Progress Notes (Signed)
Virtual Visit via Video Note  I connected with Mario Knapp on 01/07/22 at 10:30 AM EST by a video enabled telemedicine application and verified that I am speaking with the correct person using two identifiers.  Location: Patient: home Provider: office Persons participated in the visit- patient, provider    I discussed the limitations of evaluation and management by telemedicine and the availability of in person appointments. The patient expressed understanding and agreed to proceed.    I discussed the assessment and treatment plan with the patient. The patient was provided an opportunity to ask questions and all were answered. The patient agreed with the plan and demonstrated an understanding of the instructions.   The patient was advised to call back or seek an in-person evaluation if the symptoms worsen or if the condition fails to improve as anticipated.  I provided 20 minutes of non-face-to-face time during this encounter.   Norman Clay, MD    [facility] MD/PA/NP OP Progress Note  01/07/2022 10:59 AM Mario Knapp  MRN:  [id number]  Chief Complaint:  Chief Complaint  Patient presents with   Follow-up   Depression   HPI:  This is a follow-up appointment for depression and insomnia.  He states that he has been going outside more.  He enjoys going to the blue grass shows the other time.  He does not feel down and irritable at times.  He especially gets frustrated when he cannot do things.  He talks about an example of him not be able to find some tool.  He feels he is useless.  Although he denies SI, he sometimes feels that he wants to get on truck and not come back.  He wants to be by himself.  He has been feeling this way at least for many years.  Although he enjoys interaction with his grandchildren, he sometimes just want to be by himself.  He sleeps well with zaleplon.  He has not noticed any change in diet since lowering topiramate.  He denies anxiety or panic attacks.   He is willing to try bupropion at this time.    Daily routine: hang out with his wife, taking his grandchildren to school (75, 68 yo grandson) Exercise: none Employment: retired, used to work at [employer], plant closed in 2008. On social security Support: sister, wife Household: wife Marital status: married for 54 years, married three times Number of children: 3 from previous relationship. 1 killed in MVA He states that he has good childhood.  His mother had "favorite "and he tried not to be around with his father when he is drunk.  He denies any abuse from him.  He enjoyed staying at his mother and grandfather's.   Visit Diagnosis:    ICD-10-CM   1. Insomnia, unspecified type  G47.00     2. MDD (major depressive disorder), recurrent episode, mild (HCC)  F33.0 escitalopram (LEXAPRO) 10 MG tablet      Past Psychiatric History: Please see initial evaluation for full details. I have reviewed the history. No updates at this time.     Past Medical History:  Past Medical History:  Diagnosis Date   Arthritis    Depression    Hypertension    Sleep apnea     Past Surgical History:  Procedure Laterality Date   BALLOON DILATION N/A 05/11/2016   Procedure: BALLOON DILATION;  Surgeon: Manya Silvas, MD;  Location: [place];  Service: Endoscopy;  Laterality: N/A;   CHOLECYSTECTOMY     ESOPHAGOGASTRODUODENOSCOPY  N/A 04/15/2016   Procedure: ESOPHAGOGASTRODUODENOSCOPY (EGD) with removal of foreign bodu;  Surgeon: Lucilla Lame, MD;  Location: [facility] ENDOSCOPY;  Service: Endoscopy;  Laterality: N/A;   ESOPHAGOGASTRODUODENOSCOPY (EGD) WITH PROPOFOL N/A 05/11/2016   Procedure: ESOPHAGOGASTRODUODENOSCOPY (EGD) WITH PROPOFOL;  Surgeon: Manya Silvas, MD;  Location: [facility] ENDOSCOPY;  Service: Endoscopy;  Laterality: N/A;    Family Psychiatric History: Please see initial evaluation for full details. I have reviewed the history. No updates at this time.     Family History:  Family History   Problem Relation Age of Onset   Depression Mother    Thyroid disease Mother    Alcohol abuse Father    Emphysema Father    Breast cancer Sister        two times   Depression Sister    Depression Sister    Alcohol abuse Brother    Alcohol abuse Paternal Aunt     Social History:  Social History   Socioeconomic History   Marital status: Married    Spouse name: peggy    Number of children: 4   Years of education: Not on file   Highest education level: 9th grade  Occupational History   Occupation: retired  Tobacco Use   Smoking status: Former    Types: Cigarettes    Quit date: 02/25/1981    Years since quitting: 40.8   Smokeless tobacco: Former    Quit date: 02/25/1981  Vaping Use   Vaping Use: Never used  Substance and Sexual Activity   Alcohol use: No   Drug use: No   Sexual activity: Yes  Other Topics Concern   Not on file  Social History Narrative   Not on file   Social Determinants of Health   Financial Resource Strain: Not on file  Food Insecurity: Not on file  Transportation Needs: Not on file  Physical Activity: Not on file  Stress: Not on file  Social Connections: Not on file    Allergies: No Known Allergies  Metabolic Disorder Labs: Lab Results  Component Value Date   HGBA1C 5.4 02/17/2016   No results found for: PROLACTIN Lab Results  Component Value Date   CHOL 213 (H) 06/28/2012   TRIG 227 (H) 06/28/2012   HDL 35 (L) 06/28/2012   VLDL 45 (H) 06/28/2012   LDLCALC 133 (H) 06/28/2012   LDLCALC 118 (H) 12/22/2011   Lab Results  Component Value Date   TSH 2.059 05/24/2021   TSH 2.997 02/17/2016    Therapeutic Level Labs: Lab Results  Component Value Date   LITHIUM 0.32 (L) 05/24/2021   No results found for: VALPROATE No components found for:  CBMZ  Current Medications: Current Outpatient Medications  Medication Sig Dispense Refill   buPROPion (WELLBUTRIN XL) 150 MG 24 hr tablet Take 1 tablet (150 mg total) by mouth daily. 30  tablet 0   escitalopram (LEXAPRO) 10 MG tablet Take 1 tablet (10 mg total) by mouth daily. 90 tablet 0   finasteride (PROSCAR) 5 MG tablet TAKE 1 TABLET (5 MG TOTAL) BY MOUTH DAILY. 90 tablet 3   furosemide (LASIX) 20 MG tablet Take 1 tablet by mouth daily.     KLOR-CON M20 20 MEQ tablet Take 20 mEq by mouth daily.     lisinopril (ZESTRIL) 2.5 MG tablet Take by mouth.     metFORMIN (GLUCOPHAGE) 500 MG tablet Take 500 mg by mouth 2 (two) times daily.     Multiple Vitamin (MULTI-VITAMINS) TABS Take by mouth.  Multiple Vitamin (MULTIVITAMIN WITH MINERALS) TABS tablet Take 1 tablet by mouth daily.     pantoprazole (PROTONIX) 40 MG tablet TAKE 1 TABLET BY MOUTH EVERY DAY     tamsulosin (FLOMAX) 0.4 MG CAPS capsule Take 0.4 mg by mouth daily after supper.     topiramate (TOPAMAX) 50 MG tablet Take 3 tablets (150 mg total) by mouth daily. 270 tablet 0   vitamin B-12 (CYANOCOBALAMIN) 1000 MCG tablet Take 1,000 mcg by mouth daily.     zaleplon (SONATA) 10 MG capsule Take 1 capsule (10 mg total) by mouth at bedtime as needed for sleep. 30 capsule 2   No current facility-administered medications for this visit.     Musculoskeletal: Strength & Muscle Tone:  N/A Gait & Station:  N/A Patient leans: N/A  Psychiatric Specialty Exam: Review of Systems  Psychiatric/Behavioral:  Positive for dysphoric mood. Negative for agitation, behavioral problems, confusion, decreased concentration, hallucinations, self-injury, sleep disturbance and suicidal ideas. The patient is not nervous/anxious and is not hyperactive.   All other systems reviewed and are negative.  There were no vitals taken for this visit.There is no height or weight on file to calculate BMI.  General Appearance: Fairly Groomed  Eye Contact:  Good  Speech:  Clear and Coherent  Volume:  Normal  Mood:  Depressed  Affect:  Appropriate, Congruent, and slightly down  Thought Process:  Coherent  Orientation:  Full (Time, Place, and Person)   Thought Content: Logical   Suicidal Thoughts:  No  Homicidal Thoughts:  No  Memory:  Immediate;   Good  Judgement:  Good  Insight:  Good  Psychomotor Activity:  Normal  Concentration:  Concentration: Good and Attention Span: Good  Recall:  Good  Fund of Knowledge: Good  Language: Good  Akathisia:  No  Handed:  Right  AIMS (if indicated): not done  Assets:  Communication Skills Desire for Improvement  ADL's:  Intact  Cognition: WNL  Sleep:  Good   Screenings: AIMS    Flowsheet Row Admission (Discharged) from 02/16/2016 in [place] Total Score 0      AUDIT    [place] Admission (Discharged) from 02/16/2016 in [place]  Alcohol Use Disorder Identification Test Final Score (AUDIT) 0      PHQ2-9    Flowsheet Row Video Visit from 12/09/2021 in [place] Video Visit from 09/20/2021 in [place] Office Visit from 05/24/2021 in [place]  PHQ-2 Total Score 1 1 3   PHQ-9 Total Score -- -- 8      Flowsheet Row Video Visit from 09/20/2021 in [place] Office Visit from 05/24/2021 in [place] No Risk Error: Q3, 4, or 5 should not be populated when Q2 is No        Assessment and Plan:  Mario Knapp is a 76 y.o. year old male with a history of depression, hypertension, type II diabetes, sleep apnea with BiPAP, who presents for follow up appointment for below.   1. MDD (major depressive disorder), recurrent episode, mild (HCC) There has been slight worsening in depressive symptoms without significant triggers since my last visit.  Psychosocial stressors includes demoralization in relate to aging, unemployment, financial strain, and conflict with the mother of his grandson, loss of his son from MVA at age 37. He enjoys connection with his  family and is willing to work on daily exercise.  Will  start bupropion adjunctive treatment for depression.  Discussed potential risk of headache.  He has no known history of seizure.  Will continue Lexapro to target depression.   2. Insomnia, unspecified type Improving.  He reports good benefit from zaleplon.  Will continue current dose to target insomnia.   # Binge eating She denies any change in his appetite since morning topiramate.  Will taper down further to avoid polypharmacy.    Plan  Continue lexapro 10 mg daily (he has enough refills at home) Start bupropion 150 mg daily  Decrease topiramate 50 mg daily  Continue zaleplon 10 mg at night as needed for insomnia  Next appointment- 3/29 at 11:30, video  (his wife's phone- [phone])     The patient demonstrates the following risk factors for suicide: Chronic risk factors for suicide include: psychiatric disorder of depression . Acute risk factors for suicide include: N/A. Protective factors for this patient include: positive social support and hope for the future. Considering these factors, the overall suicide risk at this point appears to be low. Patient is appropriate for outpatient follow up.    Past trials of medication: he does not recall. Per chart, mirtazapine, Abilify   Collaboration of Care: Collaboration of Care: Other N/A  Patient/Guardian was advised Release of Information must be obtained prior to any record release in order to collaborate their care with an outside provider. Patient/Guardian was advised if they have not already done so to contact the registration department to sign all necessary forms in order for [place] to release information regarding their care.   Consent: Patient/Guardian gives verbal consent for treatment and assignment of benefits for services provided during this visit. Patient/Guardian expressed understanding and agreed to proceed.    Norman Clay, MD 01/07/2022, 10:59 AM

## 2022-01-07 ENCOUNTER — Telehealth (INDEPENDENT_AMBULATORY_CARE_PROVIDER_SITE_OTHER): Payer: PPO | Admitting: Psychiatry

## 2022-01-07 ENCOUNTER — Encounter: Payer: Self-pay | Admitting: Psychiatry

## 2022-01-07 ENCOUNTER — Other Ambulatory Visit: Payer: Self-pay

## 2022-01-07 DIAGNOSIS — G4700 Insomnia, unspecified: Secondary | ICD-10-CM

## 2022-01-07 DIAGNOSIS — F330 Major depressive disorder, recurrent, mild: Secondary | ICD-10-CM

## 2022-01-07 MED ORDER — ESCITALOPRAM OXALATE 10 MG PO TABS: 10.0000 mg | ORAL_TABLET | Freq: Every day | ORAL | 0 refills | Status: DC

## 2022-01-07 MED ORDER — BUPROPION HCL ER (XL) 150 MG PO TB24: 150.0000 mg | ORAL_TABLET | Freq: Every day | ORAL | 0 refills | Status: DC

## 2022-01-07 NOTE — Patient Instructions (Signed)
Continue lexapro 10 mg daily  ?Start bupropion 150 mg daily  ?Decrease topiramate 50 mg daily  ?Continue zaleplon 10 mg at night as needed for insomnia  ?Next appointment- 3/29 at 11:30 ?

## 2022-01-12 ENCOUNTER — Ambulatory Visit: Payer: PPO | Admitting: Urology

## 2022-01-12 ENCOUNTER — Encounter: Payer: Self-pay | Admitting: Urology

## 2022-01-12 ENCOUNTER — Other Ambulatory Visit: Payer: Self-pay

## 2022-01-12 VITALS — BP 126/72 | HR 82 | Ht 70.0 in | Wt 265.0 lb

## 2022-01-12 DIAGNOSIS — R361 Hematospermia: Secondary | ICD-10-CM | POA: Diagnosis not present

## 2022-01-12 DIAGNOSIS — N400 Enlarged prostate without lower urinary tract symptoms: Secondary | ICD-10-CM

## 2022-01-12 MED ORDER — FINASTERIDE 5 MG PO TABS: 5.0000 mg | ORAL_TABLET | Freq: Every day | ORAL | 3 refills | Status: DC

## 2022-01-12 NOTE — Progress Notes (Signed)
? ?01/12/2022 ?10:30 AM  ? ?Mario Knapp ?[birth year] ?[id number] ? ?Referring provider: Baxter Hire, MD ?[address] ?[place],  [place] [address] ? ?Chief Complaint  ?Patient presents with  ? Other  ? ? ?Urologic history: ?1.  Recurrent hematospermia ?PSA 01/2019 3.77 ?CT pelvis 06/2019 with 96 cc prostate, prostatic calcifications, normal seminal vesicles ?Cystoscopy 06/2019 prominent lateral lobe enlargement/hypervascularity ?Started finasteride 06/2019 ? ? ?HPI: ?76 y.o. male presents for annual follow-up. ? ?No problems since last years visit ?Denies dysuria, gross hematuria ?No bothersome LUTS; remains on finasteride ?Hematospermia resolved ?PSA 07/2021 1.25 (uncorrected) ? ? ?PMH: ?Past Medical History:  ?Diagnosis Date  ? Arthritis   ? Depression   ? Hypertension   ? Sleep apnea   ? ? ?Surgical History: ?Past Surgical History:  ?Procedure Laterality Date  ? BALLOON DILATION N/A 05/11/2016  ? Procedure: BALLOON DILATION;  Surgeon: Manya Silvas, MD;  Location: [facility] ENDOSCOPY;  Service: Endoscopy;  Laterality: N/A;  ? CHOLECYSTECTOMY    ? ESOPHAGOGASTRODUODENOSCOPY N/A 04/15/2016  ? Procedure: ESOPHAGOGASTRODUODENOSCOPY (EGD) with removal of foreign bodu;  Surgeon: Lucilla Lame, MD;  Location: [facility] ENDOSCOPY;  Service: Endoscopy;  Laterality: N/A;  ? ESOPHAGOGASTRODUODENOSCOPY (EGD) WITH PROPOFOL N/A 05/11/2016  ? Procedure: ESOPHAGOGASTRODUODENOSCOPY (EGD) WITH PROPOFOL;  Surgeon: Manya Silvas, MD;  Location: [facility] ENDOSCOPY;  Service: Endoscopy;  Laterality: N/A;  ? ? ?Home Medications:  ?Allergies as of 01/12/2022   ?No Known Allergies ?  ? ?  ?Medication List  ?  ? ?  ? Accurate as of January 12, 2022 10:30 AM. If you have any questions, ask your nurse or doctor.  ?  ?  ? ?  ? ?buPROPion 150 MG 24 hr tablet ?Commonly known as: Wellbutrin XL ?Take 1 tablet (150 mg total) by mouth daily. ?  ?escitalopram 10 MG tablet ?Commonly known as: LEXAPRO ?Take 1 tablet (10 mg total) by mouth daily. ?  ?finasteride 5  MG tablet ?Commonly known as: PROSCAR ?TAKE 1 TABLET (5 MG TOTAL) BY MOUTH DAILY. ?  ?furosemide 20 MG tablet ?Commonly known as: LASIX ?Take 1 tablet by mouth daily. ?  ?Klor-Con M20 20 MEQ tablet ?Generic drug: potassium chloride SA ?Take 20 mEq by mouth daily. ?  ?lisinopril 2.5 MG tablet ?Commonly known as: ZESTRIL ?Take by mouth. ?  ?metFORMIN 500 MG tablet ?Commonly known as: GLUCOPHAGE ?Take 500 mg by mouth 2 (two) times daily. ?  ?Multi-Vitamins Tabs ?Take by mouth. ?  ?multivitamin with minerals Tabs tablet ?Take 1 tablet by mouth daily. ?  ?pantoprazole 40 MG tablet ?Commonly known as: PROTONIX ?TAKE 1 TABLET BY MOUTH EVERY DAY ?  ?tamsulosin 0.4 MG Caps capsule ?Commonly known as: FLOMAX ?Take 0.4 mg by mouth daily after supper. ?  ?topiramate 50 MG tablet ?Commonly known as: TOPAMAX ?Take 3 tablets (150 mg total) by mouth daily. ?  ?vitamin B-12 1000 MCG tablet ?Commonly known as: CYANOCOBALAMIN ?Take 1,000 mcg by mouth daily. ?  ?zaleplon 10 MG capsule ?Commonly known as: SONATA ?Take 1 capsule (10 mg total) by mouth at bedtime as needed for sleep. ?  ? ?  ? ? ?Allergies: No Known Allergies ? ?Family History: ?Family History  ?Problem Relation Age of Onset  ? Depression Mother   ? Thyroid disease Mother   ? Alcohol abuse Father   ? Emphysema Father   ? Breast cancer Sister   ?     two times  ? Depression Sister   ? Depression Sister   ?  Alcohol abuse Brother   ? Alcohol abuse Paternal Aunt   ? ? ?Social History:  reports that he quit smoking about 40 years ago. His smoking use included cigarettes. He quit smokeless tobacco use about 40 years ago. He reports that he does not drink alcohol and does not use drugs. ? ? ?Physical Exam: ?BP 126/72   Pulse 82   Ht 5\' 10"  (1.778 m)   Wt 265 lb (120.2 kg)   BMI 38.02 kg/m?   ?Constitutional:  Alert and oriented, No acute distress. ?HEENT: [place] AT, moist mucus membranes.  Trachea midline, no masses. ?Cardiovascular: No clubbing, cyanosis, or  edema. ?Respiratory: Normal respiratory effort, no increased work of breathing. ? ? ?Assessment & Plan:   ? ?1. Hematospermia ?Resolved ?Finasteride refilled ? ?2.  BPH without LUTS ? ?Continue annual follow-up ? ? ?Abbie Sons, MD ? ?[place] ?[address], [address] ?[place], [place] [address] ?[phone] ? ?

## 2022-01-13 ENCOUNTER — Encounter: Payer: Self-pay | Admitting: Urology

## 2022-01-13 ENCOUNTER — Other Ambulatory Visit: Payer: Self-pay | Admitting: Psychiatry

## 2022-02-01 NOTE — Progress Notes (Deleted)
BH MD/PA/NP OP Progress Note ? ?02/01/2022 12:55 PM ?Mario Knapp  ?MRN:  [id number] ? ?Chief Complaint: No chief complaint on file. ? ?HPI: *** ?Visit Diagnosis: No diagnosis found. ? ?Past Psychiatric History: Please see initial evaluation for full details. I have reviewed the history. No updates at this time.  ?  ? ?Past Medical History:  ?Past Medical History:  ?Diagnosis Date  ? Arthritis   ? Depression   ? Hypertension   ? Sleep apnea   ?  ?Past Surgical History:  ?Procedure Laterality Date  ? BALLOON DILATION N/A 05/11/2016  ? Procedure: BALLOON DILATION;  Surgeon: Manya Silvas, MD;  Location: [facility] ENDOSCOPY;  Service: Endoscopy;  Laterality: N/A;  ? CHOLECYSTECTOMY    ? ESOPHAGOGASTRODUODENOSCOPY N/A 04/15/2016  ? Procedure: ESOPHAGOGASTRODUODENOSCOPY (EGD) with removal of foreign bodu;  Surgeon: Lucilla Lame, MD;  Location: [facility] ENDOSCOPY;  Service: Endoscopy;  Laterality: N/A;  ? ESOPHAGOGASTRODUODENOSCOPY (EGD) WITH PROPOFOL N/A 05/11/2016  ? Procedure: ESOPHAGOGASTRODUODENOSCOPY (EGD) WITH PROPOFOL;  Surgeon: Manya Silvas, MD;  Location: [facility] ENDOSCOPY;  Service: Endoscopy;  Laterality: N/A;  ? ? ?Family Psychiatric History: Please see initial evaluation for full details. I have reviewed the history. No updates at this time.  ?  ? ?Family History:  ?Family History  ?Problem Relation Age of Onset  ? Depression Mother   ? Thyroid disease Mother   ? Alcohol abuse Father   ? Emphysema Father   ? Breast cancer Sister   ?     two times  ? Depression Sister   ? Depression Sister   ? Alcohol abuse Brother   ? Alcohol abuse Paternal Aunt   ? ? ?Social History:  ?Social History  ? ?Socioeconomic History  ? Marital status: Married  ?  Spouse name: peggy   ? Number of children: 4  ? Years of education: Not on file  ? Highest education level: 9th grade  ?Occupational History  ? Occupation: retired  ?Tobacco Use  ? Smoking status: Former  ?  Types: Cigarettes  ?  Quit date: 02/25/1981  ?  Years since quitting:  40.9  ? Smokeless tobacco: Former  ?  Quit date: 02/25/1981  ?Vaping Use  ? Vaping Use: Never used  ?Substance and Sexual Activity  ? Alcohol use: No  ? Drug use: No  ? Sexual activity: Yes  ?Other Topics Concern  ? Not on file  ?Social History Narrative  ? Not on file  ? ?Social Determinants of Health  ? ?Financial Resource Strain: Not on file  ?Food Insecurity: Not on file  ?Transportation Needs: Not on file  ?Physical Activity: Not on file  ?Stress: Not on file  ?Social Connections: Not on file  ? ? ?Allergies: No Known Allergies ? ?Metabolic Disorder Labs: ?Lab Results  ?Component Value Date  ? HGBA1C 5.4 02/17/2016  ? ?No results found for: PROLACTIN ?Lab Results  ?Component Value Date  ? CHOL 213 (H) 06/28/2012  ? TRIG 227 (H) 06/28/2012  ? HDL 35 (L) 06/28/2012  ? VLDL 45 (H) 06/28/2012  ? LDLCALC 133 (H) 06/28/2012  ? LDLCALC 118 (H) 12/22/2011  ? ?Lab Results  ?Component Value Date  ? TSH 2.059 05/24/2021  ? TSH 2.997 02/17/2016  ? ? ?Therapeutic Level Labs: ?Lab Results  ?Component Value Date  ? LITHIUM 0.32 (L) 05/24/2021  ? ?No results found for: VALPROATE ?No components found for:  CBMZ ? ?Current Medications: ?Current Outpatient Medications  ?Medication Sig Dispense Refill  ? buPROPion (WELLBUTRIN XL)  150 MG 24 hr tablet Take 1 tablet (150 mg total) by mouth daily. 30 tablet 0  ? escitalopram (LEXAPRO) 10 MG tablet Take 1 tablet (10 mg total) by mouth daily. 90 tablet 0  ? finasteride (PROSCAR) 5 MG tablet Take 1 tablet (5 mg total) by mouth daily. 90 tablet 3  ? furosemide (LASIX) 20 MG tablet Take 1 tablet by mouth daily.    ? KLOR-CON M20 20 MEQ tablet Take 20 mEq by mouth daily.    ? lisinopril (ZESTRIL) 2.5 MG tablet Take by mouth.    ? metFORMIN (GLUCOPHAGE) 500 MG tablet Take 500 mg by mouth 2 (two) times daily.    ? Multiple Vitamin (MULTI-VITAMINS) TABS Take by mouth.    ? Multiple Vitamin (MULTIVITAMIN WITH MINERALS) TABS tablet Take 1 tablet by mouth daily.    ? pantoprazole (PROTONIX) 40 MG  tablet TAKE 1 TABLET BY MOUTH EVERY DAY    ? tamsulosin (FLOMAX) 0.4 MG CAPS capsule Take 0.4 mg by mouth daily after supper.    ? topiramate (TOPAMAX) 50 MG tablet Take 3 tablets (150 mg total) by mouth daily. 270 tablet 0  ? vitamin B-12 (CYANOCOBALAMIN) 1000 MCG tablet Take 1,000 mcg by mouth daily.    ? zaleplon (SONATA) 10 MG capsule Take 1 capsule (10 mg total) by mouth at bedtime as needed for sleep. 30 capsule 2  ? ?No current facility-administered medications for this visit.  ? ? ? ?Musculoskeletal: ?Strength & Muscle Tone:  N/A ?Gait & Station:  N/A ?Patient leans: N/A ? ?Psychiatric Specialty Exam: ?Review of Systems  ?There were no vitals taken for this visit.There is no height or weight on file to calculate BMI.  ?General Appearance: {Appearance:22683}  ?Eye Contact:  {BHH EYE CONTACT:22684}  ?Speech:  Clear and Coherent  ?Volume:  Normal  ?Mood:  {BHH MOOD:22306}  ?Affect:  {Affect (PAA):22687}  ?Thought Process:  Coherent  ?Orientation:  Full (Time, Place, and Person)  ?Thought Content: Logical   ?Suicidal Thoughts:  {ST/HT (PAA):22692}  ?Homicidal Thoughts:  {ST/HT (PAA):22692}  ?Memory:  Immediate;   Good  ?Judgement:  {Judgement (PAA):22694}  ?Insight:  {Insight (PAA):22695}  ?Psychomotor Activity:  Normal  ?Concentration:  Concentration: Good and Attention Span: Good  ?Recall:  Good  ?Fund of Knowledge: Good  ?Language: Good  ?Akathisia:  No  ?Handed:  Right  ?AIMS (if indicated): not done  ?Assets:  Communication Skills ?Desire for Improvement  ?ADL's:  Intact  ?Cognition: WNL  ?Sleep:  {BHH GOOD/FAIR/POOR:22877}  ? ?Screenings: ?AIMS   ? ?Flowsheet Row Admission (Discharged) from 02/16/2016 in [place]  ?AIMS Total Score 0  ? ?  ? ?AUDIT   ? ?Flowsheet Row Admission (Discharged) from 02/16/2016 in [place]  ?Alcohol Use Disorder Identification Test Final Score (AUDIT) 0  ? ?  ? ?PHQ2-9   ? ?Flowsheet Row Video Visit from 12/09/2021 in [place] Video Visit from 09/20/2021 in [facility] Office Visit from 05/24/2021 in [place]  ?PHQ-2 Total Score 1 1 3   ?PHQ-9 Total Score -- -- 8  ? ?  ? ?Flowsheet Row Video Visit from 09/20/2021 in [place] Office Visit from 05/24/2021 in [place]  ?C-SSRS RISK CATEGORY No Risk Error: Q3, 4, or 5 should not be populated when Q2 is No  ? ?  ? ? ? ?Assessment and Plan:  ?Mario Knapp is a 76 y.o. year old male  with a history of depression, hypertension, type II diabetes, sleep apnea with BiPAP, who presents for follow up appointment for below.  ? ? ?1. MDD (major depressive disorder), recurrent episode, mild ([place]) ?There has been slight worsening in depressive symptoms without significant triggers since my last visit.  Psychosocial stressors includes demoralization in relate to aging, unemployment, financial strain, and conflict with the mother of his grandson, loss of his son from MVA at age 26. He enjoys connection with his family and is willing to work on daily exercise.  Will start bupropion adjunctive treatment for depression.  Discussed potential risk of headache.  He has no known history of seizure.  Will continue Lexapro to target depression.  ?  ?2. Insomnia, unspecified type ?Improving.  He reports good benefit from zaleplon.  Will continue current dose to target insomnia.  ?  ?# Binge eating ?She denies any change in his appetite since morning topiramate.  Will taper down further to avoid polypharmacy.  ?  ?Plan  ?Continue lexapro 10 mg daily (he has enough refills at home) ?Start bupropion 150 mg daily  ?Decrease topiramate 50 mg daily  ?Continue zaleplon 10 mg at night as needed for insomnia  ?Next appointment- 3/29 at 11:30, video  (his wife's phone- [phone]) ?  ?  ?The patient demonstrates the following risk factors for suicide: Chronic risk  factors for suicide include: psychiatric disorder of depression . Acute risk factors for suicide include: N/A. Protective factors for this patient include: positive social support and hope for the future. Consid

## 2022-02-02 ENCOUNTER — Other Ambulatory Visit: Payer: Self-pay

## 2022-02-02 ENCOUNTER — Telehealth: Payer: PPO | Admitting: Psychiatry

## 2022-02-02 ENCOUNTER — Telehealth: Payer: Self-pay | Admitting: Psychiatry

## 2022-02-02 NOTE — Telephone Encounter (Signed)
Sent link for video visit through [place]. Patient did not sign in. Called the patient  (both home and his wife phone) for appointment scheduled today. The patient did not answer the phone. Left voice message to contact the office [phone]).   ?

## 2022-02-03 ENCOUNTER — Other Ambulatory Visit: Payer: Self-pay | Admitting: Psychiatry

## 2022-02-03 NOTE — Telephone Encounter (Signed)
Ordered refill of bupropion per request. Please contact the patient to make follow up appointment.  

## 2022-02-15 ENCOUNTER — Telehealth: Payer: Self-pay | Admitting: Psychiatry

## 2022-02-15 NOTE — Telephone Encounter (Signed)
Left voicemail message at [phone] requesting call back to schedule appointment with provider after last appointment was a no-show. ?

## 2022-02-16 ENCOUNTER — Telehealth (INDEPENDENT_AMBULATORY_CARE_PROVIDER_SITE_OTHER): Payer: PPO | Admitting: Psychiatry

## 2022-02-16 ENCOUNTER — Encounter: Payer: Self-pay | Admitting: Psychiatry

## 2022-02-16 DIAGNOSIS — F5081 Binge eating disorder: Secondary | ICD-10-CM | POA: Diagnosis not present

## 2022-02-16 DIAGNOSIS — F3341 Major depressive disorder, recurrent, in partial remission: Secondary | ICD-10-CM | POA: Diagnosis not present

## 2022-02-16 DIAGNOSIS — G4700 Insomnia, unspecified: Secondary | ICD-10-CM | POA: Diagnosis not present

## 2022-02-16 MED ORDER — BUPROPION HCL ER (XL) 150 MG PO TB24: 150.0000 mg | ORAL_TABLET | Freq: Every day | ORAL | 0 refills | Status: DC

## 2022-02-16 NOTE — Progress Notes (Signed)
Virtual Visit via Telephone Note ? ?I connected with Mario Knapp on 02/16/22 at 10:30 AM EDT by telephone and verified that I am speaking with the correct person using two identifiers. ? ?Location: ?Patient: home ?Provider: office ?Persons participated in the visit- patient, provider  ?  ?I discussed the limitations, risks, security and privacy concerns of performing an evaluation and management service by telephone and the availability of in person appointments. I also discussed with the patient that there may be a patient responsible charge related to this service. The patient expressed understanding and agreed to proceed. ? ?I discussed the assessment and treatment plan with the patient. The patient was provided an opportunity to ask questions and all were answered. The patient agreed with the plan and demonstrated an understanding of the instructions. ?  ?The patient was advised to call back or seek an in-person evaluation if the symptoms worsen or if the condition fails to improve as anticipated. ? ?I provided 12 minutes of non-face-to-face time during this encounter. ? ? ?Neysa Hottereina Liandro Thelin, MD ? ? ? ? ?Neysa Hottereina Yaretzi Ernandez, MD ? ? ?BH MD/PA/NP OP Progress Note ? ?02/16/2022 10:53 AM ?Mario HummingbirdJames Alvin Knapp  ?MRN:  [id number] ? ?Chief Complaint:  ?Chief Complaint  ?Patient presents with  ? Follow-up  ? Depression  ? ?HPI:  ?This is a follow-up appointment for depression and insomnia.  ?He states that he has been doing good.  Although he states that he does not see a lot of change since starting bupropion, he states that he does not feel depressed anymore.  He enjoys taking care of his grandchildren.  He goes outside to [place]. He enjoys everyday's activities.  He also states that he is not getting irritable as much compared to before.  Although he still does not want to be around with many people, he does enjoy being around with his family.  He has occasional fleeting passive SI, although he denies any intent or plan.   He denies any triggers when he has these thoughts.  He has not noticed any change in his appetite since lowering the dose of topiramate; he agrees to try tapering off this medication.  He denies anxiety.  He feels comfortable to stay on other medication as it is at this time.  ? ?Daily routine: hang out with his wife, taking his grandchildren to school (3811, 285 yo grandson) ?Exercise: none ?Employment: retired, used to work at [employer], plant closed in 2008. On social security ?Support: sister, wife ?Household: wife ?Marital status: married for 42 years, married three times ?Number of children: 3 from previous relationship. 1 killed in MVA ?He states that he has good childhood.  His mother had "favorite "and he tried not to be around with his father when he is drunk.  He denies any abuse from him.  He enjoyed staying at his mother and grandfather's.  ? ?Visit Diagnosis:  ?  ICD-10-CM   ?1. MDD (major depressive disorder), recurrent, in partial remission (HCC)  F33.41   ?  ?2. Insomnia, unspecified type  G47.00   ?  ?3. Binge eating disorder  F50.81   ?  ? ? ?Past Psychiatric History: Please see initial evaluation for full details. I have reviewed the history. No updates at this time.  ?  ? ?Past Medical History:  ?Past Medical History:  ?Diagnosis Date  ? Arthritis   ? Depression   ? Hypertension   ? Sleep apnea   ?  ?Past Surgical History:  ?Procedure Laterality  Date  ? BALLOON DILATION N/A 05/11/2016  ? Procedure: BALLOON DILATION;  Surgeon: Scot Jun, MD;  Location: [facility] ENDOSCOPY;  Service: Endoscopy;  Laterality: N/A;  ? CHOLECYSTECTOMY    ? ESOPHAGOGASTRODUODENOSCOPY N/A 04/15/2016  ? Procedure: ESOPHAGOGASTRODUODENOSCOPY (EGD) with removal of foreign bodu;  Surgeon: Midge Minium, MD;  Location: [facility] ENDOSCOPY;  Service: Endoscopy;  Laterality: N/A;  ? ESOPHAGOGASTRODUODENOSCOPY (EGD) WITH PROPOFOL N/A 05/11/2016  ? Procedure: ESOPHAGOGASTRODUODENOSCOPY (EGD) WITH PROPOFOL;  Surgeon: Scot Jun, MD;   Location: [facility] ENDOSCOPY;  Service: Endoscopy;  Laterality: N/A;  ? ? ?Family Psychiatric History: Please see initial evaluation for full details. I have reviewed the history. No updates at this time.  ?  ? ?Family History:  ?Family History  ?Problem Relation Age of Onset  ? Depression Mother   ? Thyroid disease Mother   ? Alcohol abuse Father   ? Emphysema Father   ? Breast cancer Sister   ?     two times  ? Depression Sister   ? Depression Sister   ? Alcohol abuse Brother   ? Alcohol abuse Paternal Aunt   ? ? ?Social History:  ?Social History  ? ?Socioeconomic History  ? Marital status: Married  ?  Spouse name: peggy   ? Number of children: 4  ? Years of education: Not on file  ? Highest education level: 9th grade  ?Occupational History  ? Occupation: retired  ?Tobacco Use  ? Smoking status: Former  ?  Types: Cigarettes  ?  Quit date: 02/25/1981  ?  Years since quitting: 41.0  ? Smokeless tobacco: Former  ?  Quit date: 02/25/1981  ?Vaping Use  ? Vaping Use: Never used  ?Substance and Sexual Activity  ? Alcohol use: No  ? Drug use: No  ? Sexual activity: Yes  ?Other Topics Concern  ? Not on file  ?Social History Narrative  ? Not on file  ? ?Social Determinants of Health  ? ?Financial Resource Strain: Not on file  ?Food Insecurity: Not on file  ?Transportation Needs: Not on file  ?Physical Activity: Not on file  ?Stress: Not on file  ?Social Connections: Not on file  ? ? ?Allergies: No Known Allergies ? ?Metabolic Disorder Labs: ?Lab Results  ?Component Value Date  ? HGBA1C 5.4 02/17/2016  ? ?No results found for: PROLACTIN ?Lab Results  ?Component Value Date  ? CHOL 213 (H) 06/28/2012  ? TRIG 227 (H) 06/28/2012  ? HDL 35 (L) 06/28/2012  ? VLDL 45 (H) 06/28/2012  ? LDLCALC 133 (H) 06/28/2012  ? LDLCALC 118 (H) 12/22/2011  ? ?Lab Results  ?Component Value Date  ? TSH 2.059 05/24/2021  ? TSH 2.997 02/17/2016  ? ? ?Therapeutic Level Labs: ?Lab Results  ?Component Value Date  ? LITHIUM 0.32 (L) 05/24/2021  ? ?No results  found for: VALPROATE ?No components found for:  CBMZ ? ?Current Medications: ?Current Outpatient Medications  ?Medication Sig Dispense Refill  ? [START ON 03/06/2022] buPROPion (WELLBUTRIN XL) 150 MG 24 hr tablet Take 1 tablet (150 mg total) by mouth daily. 90 tablet 0  ? escitalopram (LEXAPRO) 10 MG tablet Take 1 tablet (10 mg total) by mouth daily. 90 tablet 0  ? finasteride (PROSCAR) 5 MG tablet Take 1 tablet (5 mg total) by mouth daily. 90 tablet 3  ? furosemide (LASIX) 20 MG tablet Take 1 tablet by mouth daily.    ? KLOR-CON M20 20 MEQ tablet Take 20 mEq by mouth daily.    ? lisinopril (ZESTRIL)  2.5 MG tablet Take by mouth.    ? metFORMIN (GLUCOPHAGE) 500 MG tablet Take 500 mg by mouth 2 (two) times daily.    ? Multiple Vitamin (MULTI-VITAMINS) TABS Take by mouth.    ? Multiple Vitamin (MULTIVITAMIN WITH MINERALS) TABS tablet Take 1 tablet by mouth daily.    ? pantoprazole (PROTONIX) 40 MG tablet TAKE 1 TABLET BY MOUTH EVERY DAY    ? tamsulosin (FLOMAX) 0.4 MG CAPS capsule Take 0.4 mg by mouth daily after supper.    ? topiramate (TOPAMAX) 50 MG tablet Take 3 tablets (150 mg total) by mouth daily. 270 tablet 0  ? vitamin B-12 (CYANOCOBALAMIN) 1000 MCG tablet Take 1,000 mcg by mouth daily.    ? zaleplon (SONATA) 10 MG capsule Take 1 capsule (10 mg total) by mouth at bedtime as needed for sleep. 30 capsule 2  ? ?No current facility-administered medications for this visit.  ? ? ? ?Musculoskeletal: ?Strength & Muscle Tone:  N/A ?Gait & Station:  N/A ?Patient leans: N/A ? ?Psychiatric Specialty Exam: ?Review of Systems  ?Psychiatric/Behavioral:  Negative for agitation, behavioral problems, confusion, decreased concentration, dysphoric mood, hallucinations, self-injury, sleep disturbance and suicidal ideas. The patient is not nervous/anxious and is not hyperactive.   ?All other systems reviewed and are negative.  ?There were no vitals taken for this visit.There is no height or weight on file to calculate BMI.  ?General  Appearance: NA  ?Eye Contact:  NA  ?Speech:  Clear and Coherent  ?Volume:  Normal  ?Mood:   fine  ?Affect:  NA  ?Thought Process:  Coherent  ?Orientation:  Full (Time, Place, and Person)  ?Thought Asa Lente

## 2022-02-16 NOTE — Patient Instructions (Signed)
Continue lexapro 10 mg daily  ?Continue bupropion 150 mg daily  ?Discontinue topiramate 25 mg for two weeks, then discontinue  ?Continue zaleplon 10 mg at night as needed for insomnia  ?Next appointment- 6/5 at 1 PM, in person ?

## 2022-03-18 ENCOUNTER — Other Ambulatory Visit: Payer: Self-pay | Admitting: Psychiatry

## 2022-03-18 DIAGNOSIS — F39 Unspecified mood [affective] disorder: Secondary | ICD-10-CM

## 2022-03-23 DIAGNOSIS — E119 Type 2 diabetes mellitus without complications: Secondary | ICD-10-CM | POA: Diagnosis not present

## 2022-03-30 DIAGNOSIS — G4733 Obstructive sleep apnea (adult) (pediatric): Secondary | ICD-10-CM | POA: Diagnosis not present

## 2022-03-30 DIAGNOSIS — K760 Fatty (change of) liver, not elsewhere classified: Secondary | ICD-10-CM | POA: Diagnosis not present

## 2022-03-30 DIAGNOSIS — F5101 Primary insomnia: Secondary | ICD-10-CM | POA: Diagnosis not present

## 2022-03-30 DIAGNOSIS — K219 Gastro-esophageal reflux disease without esophagitis: Secondary | ICD-10-CM | POA: Diagnosis not present

## 2022-03-30 DIAGNOSIS — I10 Essential (primary) hypertension: Secondary | ICD-10-CM | POA: Diagnosis not present

## 2022-03-30 DIAGNOSIS — E782 Mixed hyperlipidemia: Secondary | ICD-10-CM | POA: Diagnosis not present

## 2022-03-30 DIAGNOSIS — E538 Deficiency of other specified B group vitamins: Secondary | ICD-10-CM | POA: Diagnosis not present

## 2022-03-30 DIAGNOSIS — E119 Type 2 diabetes mellitus without complications: Secondary | ICD-10-CM | POA: Diagnosis not present

## 2022-03-30 DIAGNOSIS — F332 Major depressive disorder, recurrent severe without psychotic features: Secondary | ICD-10-CM | POA: Diagnosis not present

## 2022-03-31 ENCOUNTER — Other Ambulatory Visit: Payer: Self-pay | Admitting: Psychiatry

## 2022-03-31 DIAGNOSIS — G4700 Insomnia, unspecified: Secondary | ICD-10-CM

## 2022-03-31 DIAGNOSIS — F3341 Major depressive disorder, recurrent, in partial remission: Secondary | ICD-10-CM

## 2022-04-01 ENCOUNTER — Telehealth: Payer: Self-pay

## 2022-04-01 ENCOUNTER — Other Ambulatory Visit: Payer: Self-pay | Admitting: Psychiatry

## 2022-04-01 DIAGNOSIS — F3341 Major depressive disorder, recurrent, in partial remission: Secondary | ICD-10-CM

## 2022-04-01 DIAGNOSIS — G4700 Insomnia, unspecified: Secondary | ICD-10-CM

## 2022-04-01 MED ORDER — ZALEPLON 10 MG PO CAPS: 10.0000 mg | ORAL_CAPSULE | Freq: Every evening | ORAL | 2 refills | Status: DC | PRN

## 2022-04-01 NOTE — Telephone Encounter (Signed)
pt wife called states tht Mario Knapp needs a refill on the zaleplon that he is out of the medication.

## 2022-04-01 NOTE — Telephone Encounter (Signed)
Ordered

## 2022-04-07 NOTE — Progress Notes (Deleted)
[facility] MD/PA/NP OP Progress Note  04/07/2022 2:39 PM Vikas Leis  MRN:  [id number]  Chief Complaint: No chief complaint on file.  HPI: *** Visit Diagnosis: No diagnosis found.  Past Psychiatric History: Please see initial evaluation for full details. I have reviewed the history. No updates at this time.     Past Medical History:  Past Medical History:  Diagnosis Date   Arthritis    Depression    Hypertension    Sleep apnea     Past Surgical History:  Procedure Laterality Date   BALLOON DILATION N/A 05/11/2016   Procedure: BALLOON DILATION;  Surgeon: Manya Silvas, MD;  Location: [facility];  Service: Endoscopy;  Laterality: N/A;   CHOLECYSTECTOMY     ESOPHAGOGASTRODUODENOSCOPY N/A 04/15/2016   Procedure: ESOPHAGOGASTRODUODENOSCOPY (EGD) with removal of foreign bodu;  Surgeon: Lucilla Lame, MD;  Location: [facility] ENDOSCOPY;  Service: Endoscopy;  Laterality: N/A;   ESOPHAGOGASTRODUODENOSCOPY (EGD) WITH PROPOFOL N/A 05/11/2016   Procedure: ESOPHAGOGASTRODUODENOSCOPY (EGD) WITH PROPOFOL;  Surgeon: Manya Silvas, MD;  Location: [facility] ENDOSCOPY;  Service: Endoscopy;  Laterality: N/A;    Family Psychiatric History: Please see initial evaluation for full details. I have reviewed the history. No updates at this time.     Family History:  Family History  Problem Relation Age of Onset   Depression Mother    Thyroid disease Mother    Alcohol abuse Father    Emphysema Father    Breast cancer Sister        two times   Depression Sister    Depression Sister    Alcohol abuse Brother    Alcohol abuse Paternal Aunt     Social History:  Social History   Socioeconomic History   Marital status: Married    Spouse name: peggy    Number of children: 4   Years of education: Not on file   Highest education level: 9th grade  Occupational History   Occupation: retired  Tobacco Use   Smoking status: Former    Types: Cigarettes    Quit date: 02/25/1981    Years since quitting: 41.1    Smokeless tobacco: Former    Quit date: 02/25/1981  Vaping Use   Vaping Use: Never used  Substance and Sexual Activity   Alcohol use: No   Drug use: No   Sexual activity: Yes  Other Topics Concern   Not on file  Social History Narrative   Not on file   Social Determinants of Health   Financial Resource Strain: Not on file  Food Insecurity: Not on file  Transportation Needs: Not on file  Physical Activity: Not on file  Stress: Not on file  Social Connections: Not on file    Allergies: No Known Allergies  Metabolic Disorder Labs: Lab Results  Component Value Date   HGBA1C 5.4 02/17/2016   No results found for: PROLACTIN Lab Results  Component Value Date   CHOL 213 (H) 06/28/2012   TRIG 227 (H) 06/28/2012   HDL 35 (L) 06/28/2012   VLDL 45 (H) 06/28/2012   LDLCALC 133 (H) 06/28/2012   LDLCALC 118 (H) 12/22/2011   Lab Results  Component Value Date   TSH 2.059 05/24/2021   TSH 2.997 02/17/2016    Therapeutic Level Labs: Lab Results  Component Value Date   LITHIUM 0.32 (L) 05/24/2021   No results found for: VALPROATE No components found for:  CBMZ  Current Medications: Current Outpatient Medications  Medication Sig Dispense Refill   buPROPion (WELLBUTRIN XL)  150 MG 24 hr tablet Take 1 tablet (150 mg total) by mouth daily. 90 tablet 0   escitalopram (LEXAPRO) 10 MG tablet Take 1 tablet (10 mg total) by mouth daily. 90 tablet 0   finasteride (PROSCAR) 5 MG tablet Take 1 tablet (5 mg total) by mouth daily. 90 tablet 3   furosemide (LASIX) 20 MG tablet Take 1 tablet by mouth daily.     KLOR-CON M20 20 MEQ tablet Take 20 mEq by mouth daily.     lisinopril (ZESTRIL) 2.5 MG tablet Take by mouth.     metFORMIN (GLUCOPHAGE) 500 MG tablet Take 500 mg by mouth 2 (two) times daily.     Multiple Vitamin (MULTI-VITAMINS) TABS Take by mouth.     Multiple Vitamin (MULTIVITAMIN WITH MINERALS) TABS tablet Take 1 tablet by mouth daily.     pantoprazole (PROTONIX) 40 MG  tablet TAKE 1 TABLET BY MOUTH EVERY DAY     tamsulosin (FLOMAX) 0.4 MG CAPS capsule Take 0.4 mg by mouth daily after supper.     topiramate (TOPAMAX) 50 MG tablet Take 3 tablets (150 mg total) by mouth daily. 270 tablet 0   vitamin B-12 (CYANOCOBALAMIN) 1000 MCG tablet Take 1,000 mcg by mouth daily.     zaleplon (SONATA) 10 MG capsule Take 1 capsule (10 mg total) by mouth at bedtime as needed for sleep. 30 capsule 2   No current facility-administered medications for this visit.     Musculoskeletal: Strength & Muscle Tone:  normal Gait & Station: normal Patient leans: N/A  Psychiatric Specialty Exam: Review of Systems  There were no vitals taken for this visit.There is no height or weight on file to calculate BMI.  General Appearance: {Appearance:22683}  Eye Contact:  {BHH EYE CONTACT:22684}  Speech:  Clear and Coherent  Volume:  Normal  Mood:  {BHH MOOD:22306}  Affect:  {Affect (PAA):22687}  Thought Process:  Coherent  Orientation:  Full (Time, Place, and Person)  Thought Content: Logical   Suicidal Thoughts:  {ST/HT (PAA):22692}  Homicidal Thoughts:  {ST/HT (PAA):22692}  Memory:  Immediate;   Good  Judgement:  {Judgement (PAA):22694}  Insight:  {Insight (PAA):22695}  Psychomotor Activity:  Normal  Concentration:  Concentration: Good and Attention Span: Good  Recall:  Good  Fund of Knowledge: Good  Language: Good  Akathisia:  No  Handed:  Right  AIMS (if indicated): not done  Assets:  Communication Skills Desire for Improvement  ADL's:  Intact  Cognition: WNL  Sleep:  {BHH GOOD/FAIR/POOR:22877}   Screenings: AIMS    Flowsheet Row Admission (Discharged) from 02/16/2016 in [facility] Total Score 0      AUDIT    Flowsheet Row Admission (Discharged) from 02/16/2016 in [facility]  Alcohol Use Disorder Identification Test Final Score (AUDIT) 0      PHQ2-9    Flowsheet Row Video Visit from 12/09/2021 in [facility] Video Visit from 09/20/2021 in [place] Office Visit from 05/24/2021 in [place]  PHQ-2 Total Score 1 1 3   PHQ-9 Total Score -- -- 8      Flowsheet Row Video Visit from 09/20/2021 in [place] Office Visit from 05/24/2021 in [facility] No Risk Error: Q3, 4, or 5 should not be populated when Q2 is No        Assessment and Plan:  Voyle Tull is a 76 y.o. year old male with  a history of depression, hypertension, type II diabetes, sleep apnea with BiPAP , who presents for follow up appointment for below.     1. MDD (major depressive disorder), recurrent, in partial remission ([place]) There has been overall improvement in depressive symptoms since starting bupropion.  There has been slight worsening in depressive symptoms without significant triggers since my last visit.  Psychosocial stressors includes demoralization in relate to aging, unemployment, financial strain, and conflict with the mother of his grandson, loss of his son from MVA at age 19. He enjoys connection with his family and yard work.  Will continue current dose of Lexapro and bupropion to target depression.    2. Insomnia, unspecified type He reports good benefit from zaleplon.  Will continue current dose to target insomnia.    3. Binge eating disorder He denies any binge eating episodes since lowering the dose of topiramate.  Will taper off this medication to avoid polypharmacy.      Plan  Continue lexapro 10 mg daily (he has enough refills at home) Continue bupropion 150 mg daily  Discontinue topiramate 25 mg for two weeks, then discontinue  Continue zaleplon 10 mg at night as needed for insomnia  Next appointment- 6/5 at 1 PM for 30 mins, in person  (his wife's phone- [phone])     The patient demonstrates the following risk factors  for suicide: Chronic risk factors for suicide include: psychiatric disorder of depression . Acute risk factors for suicide include: N/A. Protective factors for this patient include: positive social support and hope for the future. Considering these factors, the overall suicide risk at this point appears to be low. Patient is appropriate for outpatient follow up.       Collaboration of Care: Collaboration of Care: {BH OP Collaboration of Care:21014065}  Patient/Guardian was advised Release of Information must be obtained prior to any record release in order to collaborate their care with an outside provider. Patient/Guardian was advised if they have not already done so to contact the registration department to sign all necessary forms in order for [place] to release information regarding their care.   Consent: Patient/Guardian gives verbal consent for treatment and assignment of benefits for services provided during this visit. Patient/Guardian expressed understanding and agreed to proceed.    Norman Clay, MD 04/07/2022, 2:39 PM

## 2022-04-11 ENCOUNTER — Ambulatory Visit: Payer: PPO | Admitting: Psychiatry

## 2022-04-13 ENCOUNTER — Telehealth (INDEPENDENT_AMBULATORY_CARE_PROVIDER_SITE_OTHER): Payer: PPO | Admitting: Psychiatry

## 2022-04-13 ENCOUNTER — Encounter: Payer: Self-pay | Admitting: Psychiatry

## 2022-04-13 DIAGNOSIS — F3341 Major depressive disorder, recurrent, in partial remission: Secondary | ICD-10-CM | POA: Diagnosis not present

## 2022-04-13 DIAGNOSIS — F330 Major depressive disorder, recurrent, mild: Secondary | ICD-10-CM

## 2022-04-13 DIAGNOSIS — G4700 Insomnia, unspecified: Secondary | ICD-10-CM

## 2022-04-13 MED ORDER — ESCITALOPRAM OXALATE 10 MG PO TABS: 10.0000 mg | ORAL_TABLET | Freq: Every day | ORAL | 0 refills | Status: DC

## 2022-04-13 NOTE — Patient Instructions (Signed)
Continue lexapro 10 mg daily Continue bupropion 150 mg daily  Continue zaleplon 10 mg at night as needed for insomnia  Next appointment- 8/8 at 3 PM

## 2022-04-13 NOTE — Progress Notes (Signed)
Virtual Visit via Video Note  I connected with Mario Knapp on 04/13/22 at  2:00 PM EDT by a video enabled telemedicine application and verified that I am speaking with the correct person using two identifiers.  Location: Patient: home Provider: office Persons participated in the visit- patient, provider    I discussed the limitations of evaluation and management by telemedicine and the availability of in person appointments. The patient expressed understanding and agreed to proceed.    I discussed the assessment and treatment plan with the patient. The patient was provided an opportunity to ask questions and all were answered. The patient agreed with the plan and demonstrated an understanding of the instructions.   The patient was advised to call back or seek an in-person evaluation if the symptoms worsen or if the condition fails to improve as anticipated.  I provided 13 minutes of non-face-to-face time during this encounter.   Neysa Hotter, MD    [facility] MD/PA/NP OP Progress Note  04/13/2022 2:24 PM Mario Knapp  MRN:  [id number]  Chief Complaint:  Chief Complaint  Patient presents with   Follow-up   Depression   HPI:  This is a follow-up appointment for depression and insomnia.  He states that he has been doing good except that he pulled his teeth this week.  He also talks about his daughter, who has some pain after MVA. he enjoys taking care of his grandchildren, who is now out of school.  He thinks his mood is good.  Although he thinks he has been doing well, his wife tells him that he might be "fussing a lot (and smiles.)"  He tries to go outside as long as he is able to.  He will occasionally has shortness of breath.  He denies feeling depressed or anxiety.  He denies change in appetite since tapering off topiramate.  He denies SI.  He has occasional insomnia, and takes zaleplon every night for sleep.  He feels comfortable to stay on the current medication regimen.    Daily routine: hang out with his wife, taking his grandchildren to school (21, 23 yo grandson) Exercise: none Employment: retired, used to work at [employer], plant closed in 2008. On social security Support: sister, wife Household: wife Marital status: married for 42 years, married three times Number of children: 3 from previous relationship. 1 killed in MVA He states that he has good childhood.  His mother had "favorite "and he tried not to be around with his father when he is drunk.  He denies any abuse from him.  He enjoyed staying at his mother and grandfather's.     Visit Diagnosis:    ICD-10-CM   1. MDD (major depressive disorder), recurrent, in partial remission (H[facility])  F33.41     2. Insomnia, unspecified type  G47.00     3. MDD (major depressive disorder), recurrent episode, mild (HCC)  F33.0 escitalopram (LEXAPRO) 10 MG tablet      Past Psychiatric History: Please see initial evaluation for full details. I have reviewed the history. No updates at this time.     Past Medical History:  Past Medical History:  Diagnosis Date   Arthritis    Depression    Hypertension    Sleep apnea     Past Surgical History:  Procedure Laterality Date   BALLOON DILATION N/A 05/11/2016   Procedure: BALLOON DILATION;  Surgeon: Scot Jun, MD;  Location: [facility] ENDOSCOPY;  Service: Endoscopy;  Laterality: N/A;   CHOLECYSTECTOMY  ESOPHAGOGASTRODUODENOSCOPY N/A 04/15/2016   Procedure: ESOPHAGOGASTRODUODENOSCOPY (EGD) with removal of foreign bodu;  Surgeon: Lucilla Lame, MD;  Location: [facility] ENDOSCOPY;  Service: Endoscopy;  Laterality: N/A;   ESOPHAGOGASTRODUODENOSCOPY (EGD) WITH PROPOFOL N/A 05/11/2016   Procedure: ESOPHAGOGASTRODUODENOSCOPY (EGD) WITH PROPOFOL;  Surgeon: Manya Silvas, MD;  Location: [facility] ENDOSCOPY;  Service: Endoscopy;  Laterality: N/A;    Family Psychiatric History: Please see initial evaluation for full details. I have reviewed the history. No updates at this time.      Family History:  Family History  Problem Relation Age of Onset   Depression Mother    Thyroid disease Mother    Alcohol abuse Father    Emphysema Father    Breast cancer Sister        two times   Depression Sister    Depression Sister    Alcohol abuse Brother    Alcohol abuse Paternal Aunt     Social History:  Social History   Socioeconomic History   Marital status: Married    Spouse name: peggy    Number of children: 4   Years of education: Not on file   Highest education level: 9th grade  Occupational History   Occupation: retired  Tobacco Use   Smoking status: Former    Types: Cigarettes    Quit date: 02/25/1981    Years since quitting: 41.1   Smokeless tobacco: Former    Quit date: 02/25/1981  Vaping Use   Vaping Use: Never used  Substance and Sexual Activity   Alcohol use: No   Drug use: No   Sexual activity: Yes  Other Topics Concern   Not on file  Social History Narrative   Not on file   Social Determinants of Health   Financial Resource Strain: Not on file  Food Insecurity: Not on file  Transportation Needs: Not on file  Physical Activity: Not on file  Stress: Not on file  Social Connections: Not on file    Allergies: No Known Allergies  Metabolic Disorder Labs: Lab Results  Component Value Date   HGBA1C 5.4 02/17/2016   No results found for: PROLACTIN Lab Results  Component Value Date   CHOL 213 (H) 06/28/2012   TRIG 227 (H) 06/28/2012   HDL 35 (L) 06/28/2012   VLDL 45 (H) 06/28/2012   LDLCALC 133 (H) 06/28/2012   LDLCALC 118 (H) 12/22/2011   Lab Results  Component Value Date   TSH 2.059 05/24/2021   TSH 2.997 02/17/2016    Therapeutic Level Labs: Lab Results  Component Value Date   LITHIUM 0.32 (L) 05/24/2021   No results found for: VALPROATE No components found for:  CBMZ  Current Medications: Current Outpatient Medications  Medication Sig Dispense Refill   buPROPion (WELLBUTRIN XL) 150 MG 24 hr tablet Take 1  tablet (150 mg total) by mouth daily. 90 tablet 0   [START ON 05/07/2022] escitalopram (LEXAPRO) 10 MG tablet Take 1 tablet (10 mg total) by mouth daily. 90 tablet 0   finasteride (PROSCAR) 5 MG tablet Take 1 tablet (5 mg total) by mouth daily. 90 tablet 3   furosemide (LASIX) 20 MG tablet Take 1 tablet by mouth daily.     KLOR-CON M20 20 MEQ tablet Take 20 mEq by mouth daily.     lisinopril (ZESTRIL) 2.5 MG tablet Take by mouth.     metFORMIN (GLUCOPHAGE) 500 MG tablet Take 500 mg by mouth 2 (two) times daily.     Multiple Vitamin (MULTI-VITAMINS) TABS Take by mouth.  Multiple Vitamin (MULTIVITAMIN WITH MINERALS) TABS tablet Take 1 tablet by mouth daily.     pantoprazole (PROTONIX) 40 MG tablet TAKE 1 TABLET BY MOUTH EVERY DAY     tamsulosin (FLOMAX) 0.4 MG CAPS capsule Take 0.4 mg by mouth daily after supper.     vitamin B-12 (CYANOCOBALAMIN) 1000 MCG tablet Take 1,000 mcg by mouth daily.     zaleplon (SONATA) 10 MG capsule Take 1 capsule (10 mg total) by mouth at bedtime as needed for sleep. 30 capsule 2   No current facility-administered medications for this visit.     Musculoskeletal: Strength & Muscle Tone:  N/A Gait & Station:  N/A Patient leans: N/A  Psychiatric Specialty Exam: Review of Systems  Psychiatric/Behavioral:  Positive for sleep disturbance. Negative for agitation, behavioral problems, confusion, decreased concentration, dysphoric mood, hallucinations, self-injury and suicidal ideas. The patient is not nervous/anxious and is not hyperactive.   All other systems reviewed and are negative.  There were no vitals taken for this visit.There is no height or weight on file to calculate BMI.  General Appearance: Fairly Groomed  Eye Contact:  Good  Speech:  Clear and Coherent  Volume:  Normal  Mood:   good  Affect:  Appropriate, Congruent, and calm  Thought Process:  Coherent  Orientation:  Full (Time, Place, and Person)  Thought Content: Logical   Suicidal Thoughts:   No  Homicidal Thoughts:  No  Memory:  Immediate;   Good  Judgement:  Good  Insight:  Good  Psychomotor Activity:  Normal  Concentration:  Concentration: Good and Attention Span: Good  Recall:  Good  Fund of Knowledge: Good  Language: Good  Akathisia:  No  Handed:  Right  AIMS (if indicated): not done  Assets:  Communication Skills Desire for Improvement  ADL's:  Intact  Cognition: WNL  Sleep:  Fair   Screenings: AIMS    Flowsheet Row Admission (Discharged) from 02/16/2016 in [facility] Total Score 0      AUDIT    [facility] Admission (Discharged) from 02/16/2016 in [facility]  Alcohol Use Disorder Identification Test Final Score (AUDIT) 0      PHQ2-9    Flowsheet Row Video Visit from 12/09/2021 in [facility] Video Visit from 09/20/2021 in [place] Office Visit from 05/24/2021 in [place]  PHQ-2 Total Score 1 1 3   PHQ-9 Total Score -- -- 8      Flowsheet Row Video Visit from 09/20/2021 in [place] Office Visit from 05/24/2021 in [facility] No Risk Error: Q3, 4, or 5 should not be populated when Q2 is No        Assessment and Plan:  Mario Knapp is a 76 y.o. year old male with a history of depression, hypertension, type II diabetes, sleep apnea with BiPAP, who presents for follow up appointment for below.   1. MDD (major depressive disorder), recurrent, in partial remission ([place]) There has been overall improvement in depressive symptoms since the last visit. Psychosocial stressors includes demoralization in relate to aging, unemployment, financial strain, and conflict with the mother of his grandson, loss of his son from MVA at age 68. He enjoys connection with his family and yard work.  Will continue current dose of Lexapro and  bupropion to target depression.   2. Insomnia, unspecified type Improving.  Will continue current dose of Celexa long to target  insomnia.     Plan  Continue lexapro 10 mg daily (he has enough refills at home) april Continue bupropion 150 mg daily may Continue zaleplon 10 mg at night as needed for insomnia  Next appointment- 8/8 at 3 PM for 30 mins, in person  (his wife's phone- [phone])     The patient demonstrates the following risk factors for suicide: Chronic risk factors for suicide include: psychiatric disorder of depression . Acute risk factors for suicide include: N/A. Protective factors for this patient include: positive social support and hope for the future. Considering these factors, the overall suicide risk at this point appears to be low. Patient is appropriate for outpatient follow up.       Collaboration of Care: Collaboration of Care: Other N/A  Patient/Guardian was advised Release of Information must be obtained prior to any record release in order to collaborate their care with an outside provider. Patient/Guardian was advised if they have not already done so to contact the registration department to sign all necessary forms in order for [place] to release information regarding their care.   Consent: Patient/Guardian gives verbal consent for treatment and assignment of benefits for services provided during this visit. Patient/Guardian expressed understanding and agreed to proceed.    Norman Clay, MD 04/13/2022, 2:24 PM

## 2022-04-18 ENCOUNTER — Ambulatory Visit (INDEPENDENT_AMBULATORY_CARE_PROVIDER_SITE_OTHER): Payer: PPO | Admitting: Internal Medicine

## 2022-04-18 VITALS — BP 162/94 | HR 106 | Resp 18 | Ht >= 80 in | Wt 256.0 lb

## 2022-04-18 DIAGNOSIS — G4733 Obstructive sleep apnea (adult) (pediatric): Secondary | ICD-10-CM | POA: Diagnosis not present

## 2022-04-18 DIAGNOSIS — Z7189 Other specified counseling: Secondary | ICD-10-CM | POA: Insufficient documentation

## 2022-04-18 DIAGNOSIS — I10 Essential (primary) hypertension: Secondary | ICD-10-CM

## 2022-04-18 NOTE — Progress Notes (Signed)
[facility] [address] [place], [place] [address]  Pulmonary Sleep Medicine   Office Visit Note  Patient Name: Mario Knapp DOB: 1945-12-13 MRN [id number]    Chief Complaint: Obstructive Sleep Apnea visit  Brief History:  Mario Knapp is seen today for follow up visit. The patient has a 13+ year history of sleep apnea. Patient is  using PAP nightly with a medium F20 full face mask.. Patient continues to require CPAP to treat their OSA and is medically necessary. The patient feels better and can't sleep without it after sleeping with PAP.  The patient reports benefiting from PAP use. Reported sleepiness is  resolved and the Epworth Sleepiness Score is 3 out of 24. The patient 1 - 2 days weekly he does take 1 - 1.5 hour naps. The patient complains of the following: no problems   The compliance download shows  compliance with an average use time of 10:35 hours. The AHI is 0.8 hours  The patient does not complain of limb movements disrupting sleep.  ROS  General: (-) fever, (-) chills, (-) night sweat Nose and Sinuses: (-) nasal stuffiness or itchiness, (-) postnasal drip, (-) nosebleeds, (-) sinus trouble. Mouth and Throat: (-) sore throat, (-) hoarseness. Neck: (-) swollen glands, (-) enlarged thyroid, (-) neck pain. Respiratory: - cough, +shortness of breath, - wheezing. Neurologic: - numbness, - tingling. Psychiatric: - anxiety, - depression   Current Medication: Outpatient Encounter Medications as of 04/18/2022  Medication Sig   buPROPion (WELLBUTRIN XL) 150 MG 24 hr tablet Take 1 tablet (150 mg total) by mouth daily.   [START ON 05/07/2022] escitalopram (LEXAPRO) 10 MG tablet Take 1 tablet (10 mg total) by mouth daily.   finasteride (PROSCAR) 5 MG tablet Take 1 tablet (5 mg total) by mouth daily.   furosemide (LASIX) 20 MG tablet Take 1 tablet by mouth daily.   KLOR-CON M20 20 MEQ tablet Take 20 mEq by mouth daily.   lisinopril (ZESTRIL) 2.5 MG tablet Take by mouth.    metFORMIN (GLUCOPHAGE) 500 MG tablet Take 500 mg by mouth 2 (two) times daily.   Multiple Vitamin (MULTI-VITAMINS) TABS Take by mouth.   Multiple Vitamin (MULTIVITAMIN WITH MINERALS) TABS tablet Take 1 tablet by mouth daily.   pantoprazole (PROTONIX) 40 MG tablet TAKE 1 TABLET BY MOUTH EVERY DAY   tamsulosin (FLOMAX) 0.4 MG CAPS capsule Take 0.4 mg by mouth daily after supper.   vitamin B-12 (CYANOCOBALAMIN) 1000 MCG tablet Take 1,000 mcg by mouth daily.   zaleplon (SONATA) 10 MG capsule Take 1 capsule (10 mg total) by mouth at bedtime as needed for sleep.   No facility-administered encounter medications on file as of 04/18/2022.    Surgical History: Past Surgical History:  Procedure Laterality Date   BALLOON DILATION N/A 05/11/2016   Procedure: BALLOON DILATION;  Surgeon: Scot Jun, MD;  Location: [facility] ENDOSCOPY;  Service: Endoscopy;  Laterality: N/A;   CHOLECYSTECTOMY     ESOPHAGOGASTRODUODENOSCOPY N/A 04/15/2016   Procedure: ESOPHAGOGASTRODUODENOSCOPY (EGD) with removal of foreign bodu;  Surgeon: Midge Minium, MD;  Location: [facility] ENDOSCOPY;  Service: Endoscopy;  Laterality: N/A;   ESOPHAGOGASTRODUODENOSCOPY (EGD) WITH PROPOFOL N/A 05/11/2016   Procedure: ESOPHAGOGASTRODUODENOSCOPY (EGD) WITH PROPOFOL;  Surgeon: Scot Jun, MD;  Location[facility] ENDOSCOPY;  Service: Endoscopy;  Laterality: N/A;    Medical History: Past Medical History:  Diagnosis Date   Arthritis    Depression    Hypertension    Sleep apnea     Family History: Non contributory  to the present illness  Social History: Social History   Socioeconomic History   Marital status: Married    Spouse name: peggy    Number of children: 4   Years of education: Not on file   Highest education level: 9th grade  Occupational History   Occupation: retired  Tobacco Use   Smoking status: Former    Types: Cigarettes    Quit date: 02/25/1981    Years since quitting: 41.1   Smokeless tobacco: Former    Quit date:  02/25/1981  Vaping Use   Vaping Use: Never used  Substance and Sexual Activity   Alcohol use: No   Drug use: No   Sexual activity: Yes  Other Topics Concern   Not on file  Social History Narrative   Not on file   Social Determinants of Health   Financial Resource Strain: Low Risk  (09/11/2017)   Overall Financial Resource Strain (CARDIA)    Difficulty of Paying Living Expenses: Not hard at all  Food Insecurity: No Food Insecurity (09/11/2017)   Hunger Vital Sign    Worried About Running Out of Food in the Last Year: Never true    Ran Out of Food in the Last Year: Never true  Transportation Needs: No Transportation Needs (09/11/2017)   PRAPARE - Administrator, Civil Service (Medical): No    Lack of Transportation (Non-Medical): No  Physical Activity: Sufficiently Active (09/11/2017)   Exercise Vital Sign    Days of Exercise per Week: 3 days    Minutes of Exercise per Session: 60 min  Stress: No Stress Concern Present (09/11/2017)   [employer] of Occupational Health - Occupational Stress Questionnaire    Feeling of Stress : Not at all  Social Connections: Moderately Integrated (09/11/2017)   Social Connection and Isolation Panel [NHANES]    Frequency of Communication with Friends and Family: Once a week    Frequency of Social Gatherings with Friends and Family: Never    Attends Religious Services: More than 4 times per year    Active Member of [employer] or Organizations: Yes    Attends Banker Meetings: More than 4 times per year    Marital Status: Married  Catering manager Violence: Not At Risk (09/11/2017)   Humiliation, Afraid, Rape, and Kick questionnaire    Fear of Current or Ex-Partner: No    Emotionally Abused: No    Physically Abused: No    Sexually Abused: No    Vital Signs: There were no vitals taken for this visit. There is no height or weight on file to calculate BMI.    Examination: General Appearance: The patient is well-developed,  well-nourished, and in no distress. Neck Circumference: 48 cm Skin: Gross inspection of skin unremarkable. Head: normocephalic, no gross deformities. Eyes: no gross deformities noted. ENT: ears appear grossly normal Neurologic: Alert and oriented. No involuntary movements.    EPWORTH SLEEPINESS SCALE:  Scale:  (0)= no chance of dozing; (1)= slight chance of dozing; (2)= moderate chance of dozing; (3)= high chance of dozing  Chance  Situtation    Sitting and reading: 0    Watching TV: 0    Sitting Inactive in public: 0    As a passenger in car: 0      Lying down to rest: 3    Sitting and talking: 0    Sitting quielty after lunch: 0    In a car, stopped in traffic: 0   TOTAL SCORE:   3  out of 24    SLEEP STUDIES:  PSG - 07/03/09  AHI of 77 per hour, O2 desats to low 60s in REM, Low SpO2 50%   CPAP COMPLIANCE DATA:  Date Range: 04/13/21 - 04/12/22  Average Daily Use: 10:35 hours  Median Use: 10:12 hours  Compliance for > 4 Hours: 100%  AHI: 0.8 respiratory events per hour  Days Used: 365/365  Mask Leak: 1.4 lpm  95th Percentile Pressure: BiPAP 17/13 cmH2O   LABS: No results found for this or any previous visit (from the past 2160 hour(s)).  Radiology: No results found.  No results found.  No results found.    Assessment and Plan: Patient Active Problem List   Diagnosis Date Noted   OSA treated with BiPAP 04/19/2021   Obesity (BMI 30-39.9) 04/19/2021   Severe obesity (BMI >= 40) (HCC) 03/25/2021   Controlled type 2 diabetes mellitus without complication, without long-term current use of insulin (HCC) 04/22/2020   Erectile dysfunction due to arterial insufficiency 01/09/2020   Hematospermia 01/08/2020   Insomnia 10/29/2019   Fatty liver disease, nonalcoholic 06/26/2018   Esophageal foreign body    Stricture and stenosis of esophagus    Adiposity 03/29/2016   Major depressive disorder, recurrent episode (HCC) 03/01/2016   Social anxiety  disorder 03/01/2016   B12 deficiency 02/26/2016   OSA (obstructive sleep apnea) 02/23/2016   BPH without obstruction/lower urinary tract symptoms 02/16/2016   Osteoarthritis 06/11/2014   Hypertension 06/11/2014   Hyperlipidemia, unspecified 06/11/2014   1. OSA treated with BiPAP The patient does tolerate PAP and reports  benefit from PAP use. PAP continues to be medically necessary to treat the patient's OSA. The patient was reminded how to clean equipment and advised to replace supplies routinely. The patient was also counselled on weight loss. The compliance is excellent. The AHI is 0.8.   OSA treated with bipap- pap continues to be medically necessary to treat the patient's OSA. Continue with excellent compliance, f/u one year.    2. Encounter for BiPAP use counseling Counseling: had a lengthy discussion with the patient regarding the importance of PAP therapy in management of the sleep apnea. Patient appears to understand the risk factor reduction and also understands the risks associated with untreated sleep apnea. Patient will try to make a good faith effort to remain compliant with therapy. Also instructed the patient on proper cleaning of the device including the water must be changed daily if possible and use of distilled water is preferred. Patient understands that the machine should be regularly cleaned with appropriate recommended cleaning solutions that do not damage the PAP machine for example given white vinegar and water rinses. Other methods such as ozone treatment may not be as good as these simple methods to achieve cleaning.   3. Hypertension, unspecified type Hypertension Counseling:   The following hypertensive lifestyle modification were recommended and discussed:  1. Limiting alcohol intake to less than 1 oz/day of ethanol:(24 oz of beer or 8 oz of wine or 2 oz of 100-proof whiskey). 2. Take baby ASA 81 mg daily. 3. Importance of regular aerobic exercise and losing  weight. 4. Reduce dietary saturated fat and cholesterol intake for overall cardiovascular health. 5. Maintaining adequate dietary potassium, calcium, and magnesium intake. 6. Regular monitoring of the blood pressure. 7. Reduce sodium intake to less than 100 mmol/day (less than 2.3 gm of sodium or less than 6 gm of sodium choride)      General Counseling: I have discussed the findings of  the evaluation and examination with Mario Knapp.  I have also discussed any further diagnostic evaluation thatmay be needed or ordered today. Mario Knapp verbalizes understanding of the findings of todays visit. We also reviewed his medications today and discussed drug interactions and side effects including but not limited excessive drowsiness and altered mental states. We also discussed that there is always a risk not just to him but also people around him. he has been encouraged to call the office with any questions or concerns that should arise related to todays visit.  No orders of the defined types were placed in this encounter.       I have personally obtained a history, examined the patient, evaluated laboratory and imaging results, formulated the assessment and plan and placed orders. This patient was seen today by Emmaline Kluver, PA-C in collaboration with Dr. Freda Munro.    Yevonne Pax, MD [facility] Diplomate ABMS Pulmonary Critical Care Medicine and Sleep Medicine

## 2022-04-18 NOTE — Patient Instructions (Signed)

## 2022-05-20 DIAGNOSIS — G4733 Obstructive sleep apnea (adult) (pediatric): Secondary | ICD-10-CM | POA: Diagnosis not present

## 2022-05-20 DIAGNOSIS — E669 Obesity, unspecified: Secondary | ICD-10-CM | POA: Diagnosis not present

## 2022-06-02 ENCOUNTER — Other Ambulatory Visit: Payer: Self-pay | Admitting: Psychiatry

## 2022-06-11 NOTE — Progress Notes (Signed)
BH MD/PA/NP OP Progress Note  06/14/2022 3:35 PM Mario Knapp  MRN:  [id number]  Chief Complaint:  Chief Complaint  Patient presents with   Follow-up   HPI:  This is a follow-up appointment for depression.  He states that he has been doing well.  He now has great grandchild, who lives in [place].  He enjoyed seeing him.  He has not been able to see one of his great grandchild due to his grandson's wife.  Although they used to spend time together, she stopped contacting with them after the birth of her child.  She reportedly told his grandson about what his wife said, although it was a lie.  He reports great relationship with his other family.  Although he occasionally feels down, his mood is good otherwise.  He has occasional passive SI, stating that his mother did not find any use in him in the past.  However, he denies any intent or plan.  His family is the only thing for him to live.  He has decrease in appetite/lost weight, waiting to get regular dentures.  He denies anxiety or irritability.  He sleeps well.  He is feels comfortable to stay on the current medication regimen.     Wt Readings from Last 3 Encounters:  06/14/22 254 lb (115.2 kg)  04/18/22 256 lb (116.1 kg)  01/12/22 265 lb (120.2 kg)     Daily routine: hang out with his wife, taking his grandchildren to school (70, 72 yo grandson) Exercise: none Employment: retired, used to work at [employer], plant closed in 2008. On social security Support: sister, wife Household: wife Marital status: married for 42 years, married three times Number of children: 3 from previous relationship. 1 killed in MVA He states that he has good childhood.  His mother had "favorite "and he tried not to be around with his father when he is drunk.  He denies any abuse from him.  He enjoyed staying at his mother and grandfather's.   Visit Diagnosis:    ICD-10-CM   1. MDD (major depressive disorder), recurrent, in partial remission (HCC)   F33.41 zaleplon (SONATA) 10 MG capsule    2. Insomnia, unspecified type  G47.00 zaleplon (SONATA) 10 MG capsule    3. Anxiety  F41.9 escitalopram (LEXAPRO) 10 MG tablet      Past Psychiatric History: Please see initial evaluation for full details. I have reviewed the history. No updates at this time.     Past Medical History:  Past Medical History:  Diagnosis Date   Arthritis    Depression    Hypertension    Sleep apnea     Past Surgical History:  Procedure Laterality Date   BALLOON DILATION N/A 05/11/2016   Procedure: BALLOON DILATION;  Surgeon: Scot Jun, MD;  Location[facility] ENDOSCOPY;  Service: Endoscopy;  Laterality: N/A;   CHOLECYSTECTOMY     ESOPHAGOGASTRODUODENOSCOPY N/A 04/15/2016   Procedure: ESOPHAGOGASTRODUODENOSCOPY (EGD) with removal of foreign bodu;  Surgeon: Midge Minium, MD;  Location: [facility] ENDOSCOPY;  Service: Endoscopy;  Laterality: N/A;   ESOPHAGOGASTRODUODENOSCOPY (EGD) WITH PROPOFOL N/A 05/11/2016   Procedure: ESOPHAGOGASTRODUODENOSCOPY (EGD) WITH PROPOFOL;  Surgeon: Scot Jun, MD;  Location: [facility] ENDOSCOPY;  Service: Endoscopy;  Laterality: N/A;    Family Psychiatric History: Please see initial evaluation for full details. I have reviewed the history. No updates at this time.     Family History:  Family History  Problem Relation Age of Onset   Depression Mother  Thyroid disease Mother    Alcohol abuse Father    Emphysema Father    Breast cancer Sister        two times   Depression Sister    Depression Sister    Alcohol abuse Brother    Alcohol abuse Paternal Aunt     Social History:  Social History   Socioeconomic History   Marital status: Married    Spouse name: peggy    Number of children: 4   Years of education: Not on file   Highest education level: 9th grade  Occupational History   Occupation: retired  Tobacco Use   Smoking status: Former    Types: Cigarettes    Quit date: 02/25/1981    Years since quitting: 41.3    Smokeless tobacco: Former    Quit date: 02/25/1981  Vaping Use   Vaping Use: Never used  Substance and Sexual Activity   Alcohol use: No   Drug use: No   Sexual activity: Yes  Other Topics Concern   Not on file  Social History Narrative   Not on file   Social Determinants of Health   Financial Resource Strain: Low Risk  (09/11/2017)   Overall Financial Resource Strain (CARDIA)    Difficulty of Paying Living Expenses: Not hard at all  Food Insecurity: No Food Insecurity (09/11/2017)   Hunger Vital Sign    Worried About Running Out of Food in the Last Year: Never true    Ran Out of Food in the Last Year: Never true  Transportation Needs: No Transportation Needs (09/11/2017)   PRAPARE - Administrator, Civil Service (Medical): No    Lack of Transportation (Non-Medical): No  Physical Activity: Sufficiently Active (09/11/2017)   Exercise Vital Sign    Days of Exercise per Week: 3 days    Minutes of Exercise per Session: 60 min  Stress: No Stress Concern Present (09/11/2017)   [employer] of Occupational Health - Occupational Stress Questionnaire    Feeling of Stress : Not at all  Social Connections: Moderately Integrated (09/11/2017)   Social Connection and Isolation Panel [NHANES]    Frequency of Communication with Friends and Family: Once a week    Frequency of Social Gatherings with Friends and Family: Never    Attends Religious Services: More than 4 times per year    Active Member of Clubs or Organizations: Yes    Attends Banker Meetings: More than 4 times per year    Marital Status: Married    Allergies: No Known Allergies  Metabolic Disorder Labs: Lab Results  Component Value Date   HGBA1C 5.4 02/17/2016   No results found for: "PROLACTIN" Lab Results  Component Value Date   CHOL 213 (H) 06/28/2012   TRIG 227 (H) 06/28/2012   HDL 35 (L) 06/28/2012   VLDL 45 (H) 06/28/2012   LDLCALC 133 (H) 06/28/2012   LDLCALC 118 (H) 12/22/2011    Lab Results  Component Value Date   TSH 2.059 05/24/2021   TSH 2.997 02/17/2016    Therapeutic Level Labs: Lab Results  Component Value Date   LITHIUM 0.32 (L) 05/24/2021   No results found for: "VALPROATE" No results found for: "CBMZ"  Current Medications: Current Outpatient Medications  Medication Sig Dispense Refill   amoxicillin (AMOXIL) 500 MG tablet Take 500 mg by mouth 3 (three) times daily.     buPROPion (WELLBUTRIN XL) 150 MG 24 hr tablet Take 1 tablet (150 mg total) by mouth daily. 90  tablet 0   finasteride (PROSCAR) 5 MG tablet Take 1 tablet (5 mg total) by mouth daily. 90 tablet 3   furosemide (LASIX) 20 MG tablet Take 1 tablet by mouth daily.     KLOR-CON M20 20 MEQ tablet Take 20 mEq by mouth daily.     lisinopril (ZESTRIL) 5 MG tablet Take 5 mg by mouth daily.     lisinopril (ZESTRIL) 5 MG tablet Take 1 tablet by mouth daily.     metFORMIN (GLUCOPHAGE) 500 MG tablet Take by mouth.     Multiple Vitamin (MULTI-VITAMINS) TABS Take by mouth.     pantoprazole (PROTONIX) 40 MG tablet Take by mouth.     pantoprazole (PROTONIX) 40 MG tablet Take 1 tablet by mouth daily.     vitamin B-12 (CYANOCOBALAMIN) 1000 MCG tablet Take 1,000 mcg by mouth daily.     [START ON 08/06/2022] escitalopram (LEXAPRO) 10 MG tablet Take 1 tablet (10 mg total) by mouth daily. 90 tablet 1   [START ON 07/01/2022] zaleplon (SONATA) 10 MG capsule Take 1 capsule (10 mg total) by mouth at bedtime as needed for sleep. 30 capsule 2   No current facility-administered medications for this visit.     Musculoskeletal: Strength & Muscle Tone: within normal limits Gait & Station: normal Patient leans: N/A  Psychiatric Specialty Exam: Review of Systems  Psychiatric/Behavioral:  Positive for dysphoric mood. Negative for agitation, behavioral problems, confusion, decreased concentration, hallucinations, self-injury, sleep disturbance and suicidal ideas. The patient is not nervous/anxious and is not  hyperactive.   All other systems reviewed and are negative.   Blood pressure (!) 165/86, pulse (!) 102, temperature 98.5 F (36.9 C), temperature source Temporal, weight 254 lb (115.2 kg).Body mass index is 26.56 kg/m.  General Appearance: Fairly Groomed  Eye Contact:  Good  Speech:  Clear and Coherent  Volume:  Normal  Mood:   good  Affect:  Appropriate, Congruent, and Full Range  Thought Process:  Coherent  Orientation:  Full (Time, Place, and Person)  Thought Content: Logical   Suicidal Thoughts:  No  Homicidal Thoughts:  No  Memory:  Immediate;   Good  Judgement:  Good  Insight:  Good  Psychomotor Activity:  Normal  Concentration:  Concentration: Good and Attention Span: Good  Recall:  Good  Fund of Knowledge: Good  Language: Good  Akathisia:  No  Handed:  Right  AIMS (if indicated): not done  Assets:  Communication Skills Desire for Improvement  ADL's:  Intact  Cognition: WNL  Sleep:  Good   Screenings: AIMS    Flowsheet Row Admission (Discharged) from 02/16/2016 in [facility]IENT [facility]INE  AIMS Total Score 0      AUDIT    Flowsheet Row Admission (Discharged) from 02/16/2016 in [facility] INPATIENT BEHAV[facility]AL MEDICINE  Alcohol Use Disorder Identification Test Final Score (AUDIT) 0      GAD-7    Flowsheet Row Office Visit from 06/14/2022 in [facility]  Total GAD-7 Score 1      PHQ2-9    Flowsheet Row Office Visit from 06/14/2022 in [facility] Video Visit from 12/09/2021 in [facility] Video Visit from 09/20/2021 in [facility] Office Visit from 05/24/2021 in [facility]  PHQ-2 Total Score 0 1 1 3   PHQ-9 Total Score -- -- -- 8      Flowsheet Row Video Visit from 09/20/2021 in [facility] Office Visit from 05/24/2021 in [facility]  C-SSRS RISK CATEGORY  No Risk Error: Q3, 4, or 5 should not be populated when Q2 is No        Assessment and Plan:  Morocco Gipe is a 76 y.o. year old male with a history of depression, hypertension, type II diabetes, sleep apnea with BiPAP, who presents for follow up appointment for below.   1. MDD (major depressive disorder), recurrent, in partial remission (HCC) He denies significant mood symptoms since the last visit. Psychosocial stressors includes demoralization in relate to aging, unemployment, financial strain, and conflict with the mother of his grandson, loss of his son from MVA at age 76.  Will continue current dose of Lexapro and then bupropion to target depression.   2. Insomnia, unspecified type Improving.  He has good benefit from zaleplon.  Will continue current dose to target insomnia.   # Hypertension He was advised to monitor home blood pressure, and contact his provider if he continues to have hypertension.    Plan  Continue lexapro 10 mg daily Continue bupropion 150 mg daily  Continue zaleplon 10 mg at night as needed for insomnia  Next appointment- 8/8 at 3 PM for 30 mins, in person  (his wife's phone- [phone])     The patient demonstrates the following risk factors for suicide: Chronic risk factors for suicide include: psychiatric disorder of depression . Acute risk factors for suicide include: N/A. Protective factors for this patient include: positive social support and hope for the future. Considering these factors, the overall suicide risk at this point appears to be low. Patient is appropriate for outpatient follow up.       Collaboration of Care: Other reviewed the note from other provider  Patient/Guardian was advised Release of Information must be obtained prior to any record release in order to collaborate their care with an outside provider. Patient/Guardian was advised if they have not already done so to contact the registration department to sign all necessary  forms in order for [place] to release information regarding their care.   Consent: Patient/Guardian gives verbal consent for treatment and assignment of benefits for services provided during this visit. Patient/Guardian expressed understanding and agreed to proceed.      Neysa Hotter, MD 06/14/2022, 3:35 PM

## 2022-06-14 ENCOUNTER — Ambulatory Visit: Payer: PPO | Admitting: Psychiatry

## 2022-06-14 ENCOUNTER — Encounter: Payer: Self-pay | Admitting: Psychiatry

## 2022-06-14 DIAGNOSIS — F419 Anxiety disorder, unspecified: Secondary | ICD-10-CM

## 2022-06-14 DIAGNOSIS — G4700 Insomnia, unspecified: Secondary | ICD-10-CM | POA: Diagnosis not present

## 2022-06-14 DIAGNOSIS — F3341 Major depressive disorder, recurrent, in partial remission: Secondary | ICD-10-CM

## 2022-06-14 MED ORDER — ESCITALOPRAM OXALATE 10 MG PO TABS: 10.0000 mg | ORAL_TABLET | Freq: Every day | ORAL | 1 refills | Status: DC

## 2022-06-14 MED ORDER — ZALEPLON 10 MG PO CAPS
10.0000 mg | ORAL_CAPSULE | Freq: Every evening | ORAL | 2 refills | Status: DC | PRN
Start: 2022-07-01 — End: 2022-08-30

## 2022-07-20 ENCOUNTER — Other Ambulatory Visit: Payer: Self-pay | Admitting: Psychiatry

## 2022-07-26 DIAGNOSIS — E119 Type 2 diabetes mellitus without complications: Secondary | ICD-10-CM | POA: Diagnosis not present

## 2022-08-02 DIAGNOSIS — G4733 Obstructive sleep apnea (adult) (pediatric): Secondary | ICD-10-CM | POA: Diagnosis not present

## 2022-08-02 DIAGNOSIS — K219 Gastro-esophageal reflux disease without esophagitis: Secondary | ICD-10-CM | POA: Diagnosis not present

## 2022-08-02 DIAGNOSIS — I10 Essential (primary) hypertension: Secondary | ICD-10-CM | POA: Diagnosis not present

## 2022-08-02 DIAGNOSIS — F332 Major depressive disorder, recurrent severe without psychotic features: Secondary | ICD-10-CM | POA: Diagnosis not present

## 2022-08-02 DIAGNOSIS — E782 Mixed hyperlipidemia: Secondary | ICD-10-CM | POA: Diagnosis not present

## 2022-08-02 DIAGNOSIS — Z23 Encounter for immunization: Secondary | ICD-10-CM | POA: Diagnosis not present

## 2022-08-02 DIAGNOSIS — K760 Fatty (change of) liver, not elsewhere classified: Secondary | ICD-10-CM | POA: Diagnosis not present

## 2022-08-02 DIAGNOSIS — F5101 Primary insomnia: Secondary | ICD-10-CM | POA: Diagnosis not present

## 2022-08-02 DIAGNOSIS — E119 Type 2 diabetes mellitus without complications: Secondary | ICD-10-CM | POA: Diagnosis not present

## 2022-08-20 DIAGNOSIS — G4733 Obstructive sleep apnea (adult) (pediatric): Secondary | ICD-10-CM | POA: Diagnosis not present

## 2022-08-20 DIAGNOSIS — E669 Obesity, unspecified: Secondary | ICD-10-CM | POA: Diagnosis not present

## 2022-08-27 NOTE — Progress Notes (Unsigned)
BH MD/PA/NP OP Progress Note  08/30/2022 12:07 PM Mario Knapp  MRN:  [id number]  Chief Complaint:  Chief Complaint  Patient presents with   Follow-up   HPI:  This is a follow-up appointment for depression and insomnia.  He has been doing well.  He enjoys taking care of his grandchildren.  He also takes care of his dogs.  He describes his great-grandchildren is meaningful to him.  He reports good relationship with his wife, although they may disagree with each other at times.  He states that his daughter is concerned about him repeating.  He is aware that he repeats the next day as he forgets what he said.  He denies any concern otherwise.  He denies feeling depressed.  He sleeps well on most nights except that BiPAP makes loud noise.  He feels good about his weight loss since working on diet.  He is not able to eat meals due to not having permanent dentures, which he will have in Dec. he denies SI.  He denies alcohol use, drug use or cigarette use.  He is comfortable to stay on the current medication.   Family history of dementia- Maternal grandmother/aunt- in their 104's  Functional Status Instrumental Activities of Daily Living (IADLs):  Mario Knapp is independent in the following:  medications, driving Requires assistance with the following: managing finance (wife handles for many years)  Activities of Daily Living (ADLs):  Mario Knapp is independent in the following: bathing and hygiene, feeding, continence, grooming and toileting, walking   Blood Pressure 138/80 08/02/2022 10:40 AM EDT     Wt Readings from Last 3 Encounters:  08/30/22 250 lb (113.4 kg)  06/14/22 254 lb (115.2 kg)  04/18/22 256 lb (116.1 kg)     Daily routine: hang out with his wife, taking his grandchildren to school (102, 59 yo grandson) Exercise: none Employment: retired, used to work at [employer], plant closed in 2008. On social security Support: sister, wife Household: wife Marital  status: married for 42 years, married three times Number of children: 3 from previous relationship. 1 killed in MVA He states that he has good childhood.  His mother had "favorite "and he tried not to be around with his father when he is drunk.  He denies any abuse from him.  He enjoyed staying at his mother and grandfather's.   Visit Diagnosis:    ICD-10-CM   1. MDD (major depressive disorder), recurrent, in partial remission (HCC)  F33.41 zaleplon (SONATA) 10 MG capsule    2. Insomnia, unspecified type  G47.00 zaleplon (SONATA) 10 MG capsule      Past Psychiatric History: Please see initial evaluation for full details. I have reviewed the history. No updates at this time.     Past Medical History:  Past Medical History:  Diagnosis Date   Arthritis    Depression    Hypertension    Sleep apnea     Past Surgical History:  Procedure Laterality Date   BALLOON DILATION N/A 05/11/2016   Procedure: BALLOON DILATION;  Surgeon: Scot Jun, MD;  Location: [facility] ENDOSCOPY;  Service: Endoscopy;  Laterality: N/A;   CHOLECYSTECTOMY     ESOPHAGOGASTRODUODENOSCOPY N/A 04/15/2016   Procedure: ESOPHAGOGASTRODUODENOSCOPY (EGD) with removal of foreign bodu;  Surgeon: Midge Minium, MD;  Location: [facility] ENDOSCOPY;  Service: Endoscopy;  Laterality: N/A;   ESOPHAGOGASTRODUODENOSCOPY (EGD) WITH PROPOFOL N/A 05/11/2016   Procedure: ESOPHAGOGASTRODUODENOSCOPY (EGD) WITH PROPOFOL;  Surgeon: Scot Jun, MD;  Location: [facility] ENDOSCOPY;  Service: Endoscopy;  Laterality: N/A;    Family Psychiatric History: Please see initial evaluation for full details. I have reviewed the history. No updates at this time.     Family History:  Family History  Problem Relation Age of Onset   Depression Mother    Thyroid disease Mother    Alcohol abuse Father    Emphysema Father    Breast cancer Sister        two times   Depression Sister    Depression Sister    Alcohol abuse Brother    Alcohol abuse Paternal Aunt      Social History:  Social History   Socioeconomic History   Marital status: Married    Spouse name: Mario Knapp    Number of children: 4   Years of education: Not on file   Highest education level: 9th grade  Occupational History   Occupation: retired  Tobacco Use   Smoking status: Former    Types: Cigarettes    Quit date: 02/25/1981    Years since quitting: 41.5   Smokeless tobacco: Former    Quit date: 02/25/1981  Vaping Use   Vaping Use: Never used  Substance and Sexual Activity   Alcohol use: No   Drug use: No   Sexual activity: Yes  Other Topics Concern   Not on file  Social History Narrative   Not on file   Social Determinants of Health   Financial Resource Strain: Low Risk  (09/11/2017)   Overall Financial Resource Strain (CARDIA)    Difficulty of Paying Living Expenses: Not hard at all  Food Insecurity: No Food Insecurity (09/11/2017)   Hunger Vital Sign    Worried About Running Out of Food in the Last Year: Never true    Ran Out of Food in the Last Year: Never true  Transportation Needs: No Transportation Needs (09/11/2017)   PRAPARE - Hydrologist (Medical): No    Lack of Transportation (Non-Medical): No  Physical Activity: Sufficiently Active (09/11/2017)   Exercise Vital Sign    Days of Exercise per Week: 3 days    Minutes of Exercise per Session: 60 min  Stress: No Stress Concern Present (09/11/2017)   [place]    Feeling of Stress : Not at all  Social Connections: Moderately Integrated (09/11/2017)   Social Connection and Isolation Panel [NHANES]    Frequency of Communication with Friends and Family: Once a week    Frequency of Social Gatherings with Friends and Family: Never    Attends Religious Services: More than 4 times per year    Active Member of Clubs or Organizations: Yes    Attends Archivist Meetings: More than 4 times per year    Marital  Status: Married    Allergies: No Known Allergies  Metabolic Disorder Labs: Lab Results  Component Value Date   HGBA1C 5.4 02/17/2016   No results found for: "PROLACTIN" Lab Results  Component Value Date   CHOL 213 (H) 06/28/2012   TRIG 227 (H) 06/28/2012   HDL 35 (L) 06/28/2012   VLDL 45 (H) 06/28/2012   LDLCALC 133 (H) 06/28/2012   LDLCALC 118 (H) 12/22/2011   Lab Results  Component Value Date   TSH 2.059 05/24/2021   TSH 2.997 02/17/2016    Therapeutic Level Labs: Lab Results  Component Value Date   LITHIUM 0.32 (L) 05/24/2021   No results found for: "VALPROATE" No results found for: "  CBMZ"  Current Medications: Current Outpatient Medications  Medication Sig Dispense Refill   amoxicillin (AMOXIL) 500 MG tablet Take 500 mg by mouth 3 (three) times daily.     escitalopram (LEXAPRO) 10 MG tablet Take 1 tablet (10 mg total) by mouth daily. 90 tablet 1   finasteride (PROSCAR) 5 MG tablet Take 1 tablet (5 mg total) by mouth daily. 90 tablet 3   furosemide (LASIX) 20 MG tablet Take 1 tablet by mouth daily.     KLOR-CON M20 20 MEQ tablet Take 20 mEq by mouth daily.     lisinopril (ZESTRIL) 5 MG tablet Take 5 mg by mouth daily.     lisinopril (ZESTRIL) 5 MG tablet Take 1 tablet by mouth daily.     metFORMIN (GLUCOPHAGE) 500 MG tablet Take by mouth.     Multiple Vitamin (MULTI-VITAMINS) TABS Take by mouth.     pantoprazole (PROTONIX) 40 MG tablet Take by mouth.     pantoprazole (PROTONIX) 40 MG tablet Take 1 tablet by mouth daily.     vitamin B-12 (CYANOCOBALAMIN) 1000 MCG tablet Take 1,000 mcg by mouth daily.     buPROPion (WELLBUTRIN XL) 150 MG 24 hr tablet Take 1 tablet (150 mg total) by mouth daily. 90 tablet 1   zaleplon (SONATA) 10 MG capsule Take 1 capsule (10 mg total) by mouth at bedtime as needed for sleep. 30 capsule 2   No current facility-administered medications for this visit.     Musculoskeletal: Strength & Muscle Tone: within normal limits Gait &  Station: normal Patient leans: N/A  Psychiatric Specialty Exam: Review of Systems  Psychiatric/Behavioral:  Negative for agitation, behavioral problems, confusion, decreased concentration, dysphoric mood, hallucinations, self-injury, sleep disturbance and suicidal ideas. The patient is nervous/anxious. The patient is not hyperactive.   All other systems reviewed and are negative.   Blood pressure (!) 160/81, pulse 76, height 5\' 9"  (1.753 m), weight 250 lb (113.4 kg).Body mass index is 36.92 kg/m.  General Appearance: Fairly Groomed  Eye Contact:  Good  Speech:  Clear and Coherent  Volume:  Normal  Mood:   good  Affect:  Appropriate, Congruent, and Full Range  Thought Process:  Coherent  Orientation:  Full (Time, Place, and Person)  Thought Content: Logical   Suicidal Thoughts:  No  Homicidal Thoughts:  No  Memory:  Immediate;   Good  Judgement:  Good  Insight:  Good  Psychomotor Activity:  Normal  Concentration:  Concentration: Good and Attention Span: Good  Recall:  Good  Fund of Knowledge: Good  Language: Good  Akathisia:  No  Handed:  Right  AIMS (if indicated): not done  Assets:  Communication Skills Desire for Improvement  ADL's:  Intact  Cognition: WNL  Sleep:  Good   Screenings: AIMS    Flowsheet Row Admission (Discharged) from 02/16/2016 in [facility]  AIMS Total Score 0      AUDIT    Flowsheet Row Admission (Discharged) from 02/16/2016 in [facility] INPATIENT BEHAVIORAL MEDICINE  Alcohol Use Disorder Identification Test Final Score (AUDIT) 0      GAD-7    Flowsheet Row Office Visit from 08/30/2022 in [facility] Office Visit from 06/14/2022 in [facility]  Total GAD-7 Score 2 1      PHQ2-9    Flowsheet Row Office Visit from 08/30/2022 in [facility] Office Visit from 06/14/2022 in [facility] Video Visit from 12/09/2021 in  [facility] Video  Visit from 09/20/2021 in [facility] Office Visit from 05/24/2021 in [facility]  PHQ-2 Total Score 2 0 1 1 3   PHQ-9 Total Score 3 -- -- -- 8      Flowsheet Row Video Visit from 09/20/2021 in [facility] Office Visit from 05/24/2021 in [facility]  C-SSRS RISK CATEGORY No Risk Error: Q3, 4, or 5 should not be populated when Q2 is No        Assessment and Plan:  Mario Knapp is a 76 y.o. year old male with a history of depression, hypertension, type II diabetes, sleep apnea with BiPAP, who presents for follow up appointment for below.   1. MDD (major depressive disorder), recurrent, in partial remission (HCC) He denies any significant mood symptoms except occasional anhedonia since the last visit. Psychosocial stressors includes demoralization in relate to aging, unemployment, financial strain, and conflict with the mother of his grandson, loss of his son from MVA at age 22.  He enjoys taking care of his grandchildren, and reports fair relationship with his wife.  Will continue current dose of Lexapro and bupropion to target depression.   2. Insomnia, unspecified type Improving.  He reports issues with BiPAP machine; he was advised to contact the provider about this.  Will continue zaleplon as needed for insomnia.   # r/o cognitive impairment His daughter is reportedly concerned about his memory loss/repeating himself, although he denies any concern.  IADL is independent except finances according to him.  Will continue to assess this.  He was advised to continue to receive care for hypertension.   Plan  Continue lexapro 10 mg daily Continue bupropion 150 mg daily  Continue zaleplon 10 mg at night as needed for insomnia  Next appointment- 1/16 at 11 AM for 30 mins, in person  (his wife's phone- [phone])     The  patient demonstrates the following risk factors for suicide: Chronic risk factors for suicide include: psychiatric disorder of depression . Acute risk factors for suicide include: N/A. Protective factors for this patient include: positive social support and hope for the future. Considering these factors, the overall suicide risk at this point appears to be low. Patient is appropriate for outpatient follow up.     The patient demonstrates the following risk factors for suicide: Chronic risk factors for suicide include: psychiatric disorder of depression . Acute risk factors for suicide include: N/A. Protective factors for this patient include: positive social support and hope for the future. Considering these factors, the overall suicide risk at this point appears to be low. Patient is appropriate for outpatient follow up.         Collaboration of Care: Collaboration of Care: Other reviewed notes in Epic  Patient/Guardian was advised Release of Information must be obtained prior to any record release in order to collaborate their care with an outside provider. Patient/Guardian was advised if they have not already done so to contact the registration department to sign all necessary forms in order for [phone] to release information regarding their care.   Consent: Patient/Guardian gives verbal consent for treatment and assignment of benefits for services provided during this visit. Patient/Guardian expressed understanding and agreed to proceed.    [place], MD 08/30/2022, 12:07 PM

## 2022-08-28 ENCOUNTER — Other Ambulatory Visit: Payer: Self-pay | Admitting: Psychiatry

## 2022-08-30 ENCOUNTER — Encounter: Payer: Self-pay | Admitting: Psychiatry

## 2022-08-30 ENCOUNTER — Ambulatory Visit: Payer: PPO | Admitting: Psychiatry

## 2022-08-30 DIAGNOSIS — G4700 Insomnia, unspecified: Secondary | ICD-10-CM | POA: Diagnosis not present

## 2022-08-30 DIAGNOSIS — F3341 Major depressive disorder, recurrent, in partial remission: Secondary | ICD-10-CM | POA: Diagnosis not present

## 2022-08-30 MED ORDER — ZALEPLON 10 MG PO CAPS: 10.0000 mg | ORAL_CAPSULE | Freq: Every evening | ORAL | 2 refills | Status: DC | PRN

## 2022-08-30 MED ORDER — BUPROPION HCL ER (XL) 150 MG PO TB24: 150.0000 mg | ORAL_TABLET | Freq: Every day | ORAL | 1 refills | Status: DC

## 2022-09-01 DIAGNOSIS — M5416 Radiculopathy, lumbar region: Secondary | ICD-10-CM | POA: Diagnosis not present

## 2022-09-01 DIAGNOSIS — M1711 Unilateral primary osteoarthritis, right knee: Secondary | ICD-10-CM | POA: Diagnosis not present

## 2022-09-01 DIAGNOSIS — M79651 Pain in right thigh: Secondary | ICD-10-CM | POA: Diagnosis not present

## 2022-09-01 DIAGNOSIS — M5136 Other intervertebral disc degeneration, lumbar region: Secondary | ICD-10-CM | POA: Diagnosis not present

## 2022-09-01 DIAGNOSIS — M25551 Pain in right hip: Secondary | ICD-10-CM | POA: Diagnosis not present

## 2022-09-01 DIAGNOSIS — E119 Type 2 diabetes mellitus without complications: Secondary | ICD-10-CM | POA: Diagnosis not present

## 2022-09-09 DIAGNOSIS — M1611 Unilateral primary osteoarthritis, right hip: Secondary | ICD-10-CM | POA: Diagnosis not present

## 2022-09-09 DIAGNOSIS — M47816 Spondylosis without myelopathy or radiculopathy, lumbar region: Secondary | ICD-10-CM | POA: Diagnosis not present

## 2022-09-09 DIAGNOSIS — M5137 Other intervertebral disc degeneration, lumbosacral region: Secondary | ICD-10-CM | POA: Diagnosis not present

## 2022-09-09 DIAGNOSIS — M7918 Myalgia, other site: Secondary | ICD-10-CM | POA: Diagnosis not present

## 2022-09-15 ENCOUNTER — Other Ambulatory Visit: Payer: Self-pay | Admitting: Sports Medicine

## 2022-09-15 DIAGNOSIS — M1611 Unilateral primary osteoarthritis, right hip: Secondary | ICD-10-CM

## 2022-09-15 DIAGNOSIS — M25551 Pain in right hip: Secondary | ICD-10-CM

## 2022-09-15 DIAGNOSIS — M5137 Other intervertebral disc degeneration, lumbosacral region: Secondary | ICD-10-CM | POA: Diagnosis not present

## 2022-09-15 DIAGNOSIS — M7918 Myalgia, other site: Secondary | ICD-10-CM

## 2022-09-28 ENCOUNTER — Ambulatory Visit
Admission: RE | Admit: 2022-09-28 | Discharge: 2022-09-28 | Disposition: A | Discharge status: Home / Self Care | Payer: PPO | Source: Ambulatory Visit | Attending: Sports Medicine | Admitting: Sports Medicine

## 2022-09-28 DIAGNOSIS — M25551 Pain in right hip: Secondary | ICD-10-CM | POA: Diagnosis not present

## 2022-09-28 DIAGNOSIS — M1611 Unilateral primary osteoarthritis, right hip: Secondary | ICD-10-CM | POA: Diagnosis not present

## 2022-09-28 DIAGNOSIS — M5137 Other intervertebral disc degeneration, lumbosacral region: Secondary | ICD-10-CM | POA: Insufficient documentation

## 2022-09-28 DIAGNOSIS — M7918 Myalgia, other site: Secondary | ICD-10-CM | POA: Insufficient documentation

## 2022-09-28 DIAGNOSIS — M5126 Other intervertebral disc displacement, lumbar region: Secondary | ICD-10-CM | POA: Diagnosis not present

## 2022-10-04 DIAGNOSIS — M5416 Radiculopathy, lumbar region: Secondary | ICD-10-CM | POA: Diagnosis not present

## 2022-10-06 DIAGNOSIS — M5416 Radiculopathy, lumbar region: Secondary | ICD-10-CM | POA: Diagnosis not present

## 2022-10-11 DIAGNOSIS — M5416 Radiculopathy, lumbar region: Secondary | ICD-10-CM | POA: Diagnosis not present

## 2022-10-14 DIAGNOSIS — M5416 Radiculopathy, lumbar region: Secondary | ICD-10-CM | POA: Diagnosis not present

## 2022-10-18 DIAGNOSIS — M5416 Radiculopathy, lumbar region: Secondary | ICD-10-CM | POA: Diagnosis not present

## 2022-10-20 DIAGNOSIS — M5416 Radiculopathy, lumbar region: Secondary | ICD-10-CM | POA: Diagnosis not present

## 2022-10-26 ENCOUNTER — Encounter: Payer: Self-pay | Admitting: Urology

## 2022-10-26 ENCOUNTER — Ambulatory Visit (INDEPENDENT_AMBULATORY_CARE_PROVIDER_SITE_OTHER): Payer: PPO | Admitting: Urology

## 2022-10-26 VITALS — BP 157/84 | HR 79 | Ht 70.0 in | Wt 243.0 lb

## 2022-10-26 DIAGNOSIS — N281 Cyst of kidney, acquired: Secondary | ICD-10-CM | POA: Diagnosis not present

## 2022-10-26 DIAGNOSIS — N400 Enlarged prostate without lower urinary tract symptoms: Secondary | ICD-10-CM

## 2022-10-26 NOTE — Progress Notes (Signed)
10/26/2022 10:43 AM   Mario Knapp [birth year] [id number]  Referring provider: Gracelyn Nurse, MD [address] [place],  [place] [address]  No chief complaint on file.   Urologic history: 1.  Recurrent hematospermia PSA 01/2019 3.77 CT pelvis 06/2019 with 96 cc prostate, prostatic calcifications, normal seminal vesicles Cystoscopy 06/2019 prominent lateral lobe enlargement/hypervascularity Started finasteride 06/2019   HPI: 76 y.o. male scheduled for annual follow-up March 2024 was referred for a left renal cyst incidentally noted on MRI of the hip/pelvis  No problems since last years visit Denies dysuria, gross hematuria No bothersome LUTS; remains on finasteride Hematospermia resolved Lower cuts of the MRI showed a 6 cm lower pole simple left renal cyst    PMH: Past Medical History:  Diagnosis Date   Arthritis    Depression    Hypertension    Sleep apnea     Surgical History: Past Surgical History:  Procedure Laterality Date   BALLOON DILATION N/A 05/11/2016   Procedure: BALLOON DILATION;  Surgeon: Scot Jun, MD;  Location: [facility] ENDOSCOPY;  Service: Endoscopy;  Laterality: N/A;   CHOLECYSTECTOMY     ESOPHAGOGASTRODUODENOSCOPY N/A 04/15/2016   Procedure: ESOPHAGOGASTRODUODENOSCOPY (EGD) with removal of foreign bodu;  Surgeon: Midge Minium, MD;  Location: [facility] ENDOSCOPY;  Service: Endoscopy;  Laterality: N/A;   ESOPHAGOGASTRODUODENOSCOPY (EGD) WITH PROPOFOL N/A 05/11/2016   Procedure: ESOPHAGOGASTRODUODENOSCOPY (EGD) WITH PROPOFOL;  Surgeon: Scot Jun, MD;  Location: [facility] ENDOSCOPY;  Service: Endoscopy;  Laterality: N/A;    Home Medications:  Allergies as of 10/26/2022   No Known Allergies      Medication List        Accurate as of October 26, 2022 10:43 AM. If you have any questions, ask your nurse or doctor.          amoxicillin 500 MG tablet Commonly known as: AMOXIL Take 500 mg by mouth 3 (three) times daily.   buPROPion  150 MG 24 hr tablet Commonly known as: WELLBUTRIN XL Take 1 tablet (150 mg total) by mouth daily.   cyanocobalamin 1000 MCG tablet Commonly known as: VITAMIN B12 Take 1,000 mcg by mouth daily.   escitalopram 10 MG tablet Commonly known as: LEXAPRO Take 1 tablet (10 mg total) by mouth daily.   finasteride 5 MG tablet Commonly known as: PROSCAR Take 1 tablet (5 mg total) by mouth daily.   furosemide 20 MG tablet Commonly known as: LASIX Take 1 tablet by mouth daily.   [place]-Con M20 20 MEQ tablet Generic drug: potassium chloride SA Take 20 mEq by mouth daily.   lisinopril 5 MG tablet Commonly known as: ZESTRIL Take 5 mg by mouth daily.   lisinopril 5 MG tablet Commonly known as: ZESTRIL Take 1 tablet by mouth daily.   metFORMIN 500 MG tablet Commonly known as: GLUCOPHAGE Take by mouth.   Multi-Vitamins Tabs Take by mouth.   pantoprazole 40 MG tablet Commonly known as: PROTONIX Take by mouth.   pantoprazole 40 MG tablet Commonly known as: PROTONIX Take 1 tablet by mouth daily.   zaleplon 10 MG capsule Commonly known as: SONATA Take 1 capsule (10 mg total) by mouth at bedtime as needed for sleep.        Allergies: No Known Allergies  Family History: Family History  Problem Relation Age of Onset   Depression Mother    Thyroid disease Mother    Alcohol abuse Father    Emphysema Father    Breast cancer Sister  two times   Depression Sister    Depression Sister    Alcohol abuse Brother    Alcohol abuse Paternal Aunt     Social History:  reports that he quit smoking about 41 years ago. His smoking use included cigarettes. He quit smokeless tobacco use about 41 years ago. He reports that he does not drink alcohol and does not use drugs.   Physical Exam: BP (!) 157/84   Pulse 79   Ht 5\' 10"  (1.778 m)   Wt 243 lb (110.2 kg)   BMI 34.87 kg/m   Constitutional:  Alert and oriented, No acute distress. HEENT: [place] AT, moist mucus membranes.  Trachea  midline, no masses. Cardiovascular: No clubbing, cyanosis, or edema. Respiratory: Normal respiratory effort, no increased work of breathing.   Assessment & Plan:    1.  Renal cysts Discussed that simple renal cyst are benign and no follow-up imaging is needed.  Symptoms can rarely cause dull pain if increasing in size.  Previous CTs from 2000 07/2007 were reviewed and he did have bilateral renal cysts at that time.  Reassured no concern for malignancy  2.  History hematospermia Resolved Continue finasteride  3.  BPH without LUTS  He was scheduled for follow-up March 2024 and will cancel and reschedule for 1 year   April 2024, MD  [facility] [address], [address] [place], [place] [phone]

## 2022-11-20 DIAGNOSIS — E669 Obesity, unspecified: Secondary | ICD-10-CM | POA: Diagnosis not present

## 2022-11-20 DIAGNOSIS — G4733 Obstructive sleep apnea (adult) (pediatric): Secondary | ICD-10-CM | POA: Diagnosis not present

## 2022-11-20 NOTE — Progress Notes (Deleted)
BH MD/PA/NP OP Progress Note  11/20/2022 11:12 AM Mario Knapp  MRN:  [id number]  Chief Complaint: No chief complaint on file.  HPI: *** Visit Diagnosis: No diagnosis found.  Past Psychiatric History: Please see initial evaluation for full details. I have reviewed the history. No updates at this time.     Past Medical History:  Past Medical History:  Diagnosis Date   Arthritis    Depression    Hypertension    Sleep apnea     Past Surgical History:  Procedure Laterality Date   BALLOON DILATION N/A 05/11/2016   Procedure: BALLOON DILATION;  Surgeon: Manya Silvas, MD;  Location: [facility] [place];  Service: Endoscopy;  Laterality: N/A;   CHOLECYSTECTOMY     ESOPHAGOGASTRODUODENOSCOPY N/A 04/15/2016   Procedure: ESOPHAGOGASTRODUODENOSCOPY (EGD) with removal of foreign bodu;  Surgeon: Lucilla Lame, MD;  Location: [facility] ENDOSCOPY;  Service: Endoscopy;  Laterality: N/A;   ESOPHAGOGASTRODUODENOSCOPY (EGD) WITH PROPOFOL N/A 05/11/2016   Procedure: ESOPHAGOGASTRODUODENOSCOPY (EGD) WITH PROPOFOL;  Surgeon: Manya Silvas, MD;  Location: [facility] ENDOSCOPY;  Service: Endoscopy;  Laterality: N/A;    Family Psychiatric History: Please see initial evaluation for full details. I have reviewed the history. No updates at this time.     Family History:  Family History  Problem Relation Age of Onset   Depression Mother    Thyroid disease Mother    Alcohol abuse Father    Emphysema Father    Breast cancer Sister        two times   Depression Sister    Depression Sister    Alcohol abuse Brother    Alcohol abuse Paternal Aunt     Social History:  Social History   Socioeconomic History   Marital status: Married    Spouse name: peggy    Number of children: 4   Years of education: Not on file   Highest education level: 9th grade  Occupational History   Occupation: retired  Tobacco Use   Smoking status: Former    Types: Cigarettes    Quit date: 02/25/1981    Years since quitting:  41.7   Smokeless tobacco: Former    Quit date: 02/25/1981  Vaping Use   Vaping Use: Never used  Substance and Sexual Activity   Alcohol use: No   Drug use: No   Sexual activity: Yes  Other Topics Concern   Not on file  Social History Narrative   Not on file   Social Determinants of Health   Financial Resource Strain: Low Risk  (09/11/2017)   Overall Financial Resource Strain (CARDIA)    Difficulty of Paying Living Expenses: Not hard at all  Food Insecurity: No Food Insecurity (09/11/2017)   Hunger Vital Sign    Worried About Running Out of Food in the Last Year: Never true    Ran Out of Food in the Last Year: Never true  Transportation Needs: No Transportation Needs (09/11/2017)   PRAPARE - Hydrologist (Medical): No    Lack of Transportation (Non-Medical): No  Physical Activity: Sufficiently Active (09/11/2017)   Exercise Vital Sign    Days of Exercise per Week: 3 days    Minutes of Exercise per Session: 60 min  Stress: No Stress Concern Present (09/11/2017)   [place]    Feeling of Stress : Not at all  Social Connections: Moderately Integrated (09/11/2017)   Social Connection and Isolation Panel [NHANES]    Frequency  of Communication with Friends and Family: Once a week    Frequency of Social Gatherings with Friends and Family: Never    Attends Religious Services: More than 4 times per year    Active Member of Clubs or Organizations: Yes    Attends Music therapist: More than 4 times per year    Marital Status: Married    Allergies: No Known Allergies  Metabolic Disorder Labs: Lab Results  Component Value Date   HGBA1C 5.4 02/17/2016   No results found for: "PROLACTIN" Lab Results  Component Value Date   CHOL 213 (H) 06/28/2012   TRIG 227 (H) 06/28/2012   HDL 35 (L) 06/28/2012   VLDL 45 (H) 06/28/2012   LDLCALC 133 (H) 06/28/2012   LDLCALC 118 (H)  12/22/2011   Lab Results  Component Value Date   TSH 2.059 05/24/2021   TSH 2.997 02/17/2016    Therapeutic Level Labs: Lab Results  Component Value Date   LITHIUM 0.32 (L) 05/24/2021   No results found for: "VALPROATE" No results found for: "CBMZ"  Current Medications: Current Outpatient Medications  Medication Sig Dispense Refill   amoxicillin (AMOXIL) 500 MG tablet Take 500 mg by mouth 3 (three) times daily.     buPROPion (WELLBUTRIN XL) 150 MG 24 hr tablet Take 1 tablet (150 mg total) by mouth daily. 90 tablet 1   escitalopram (LEXAPRO) 10 MG tablet Take 1 tablet (10 mg total) by mouth daily. 90 tablet 1   finasteride (PROSCAR) 5 MG tablet Take 1 tablet (5 mg total) by mouth daily. 90 tablet 3   furosemide (LASIX) 20 MG tablet Take 1 tablet by mouth daily.     KLOR-CON M20 20 MEQ tablet Take 20 mEq by mouth daily.     lisinopril (ZESTRIL) 5 MG tablet Take 5 mg by mouth daily.     lisinopril (ZESTRIL) 5 MG tablet Take 1 tablet by mouth daily.     metFORMIN (GLUCOPHAGE) 500 MG tablet Take by mouth.     Multiple Vitamin (MULTI-VITAMINS) TABS Take by mouth.     pantoprazole (PROTONIX) 40 MG tablet Take by mouth.     pantoprazole (PROTONIX) 40 MG tablet Take 1 tablet by mouth daily.     vitamin B-12 (CYANOCOBALAMIN) 1000 MCG tablet Take 1,000 mcg by mouth daily.     zaleplon (SONATA) 10 MG capsule Take 1 capsule (10 mg total) by mouth at bedtime as needed for sleep. 30 capsule 2   No current facility-administered medications for this visit.     Musculoskeletal: Strength & Muscle Tone: within normal limits Gait & Station: normal Patient leans: N/A  Psychiatric Specialty Exam: Review of Systems  There were no vitals taken for this visit.There is no height or weight on file to calculate BMI.  General Appearance: {Appearance:22683}  Eye Contact:  {BHH EYE CONTACT:22684}  Speech:  Clear and Coherent  Volume:  Normal  Mood:  {BHH MOOD:22306}  Affect:  {Affect (PAA):22687}   Thought Process:  Coherent  Orientation:  Full (Time, Place, and Person)  Thought Content: Logical   Suicidal Thoughts:  {ST/HT (PAA):22692}  Homicidal Thoughts:  {ST/HT (PAA):22692}  Memory:  Immediate;   Good  Judgement:  {Judgement (PAA):22694}  Insight:  {Insight (PAA):22695}  Psychomotor Activity:  Normal  Concentration:  Concentration: Good and Attention Span: Good  Recall:  Good  Fund of Knowledge: Good  Language: Good  Akathisia:  No  Handed:  Right  AIMS (if indicated): not done  Assets:  Communication Skills  Desire for Improvement  ADL's:  Intact  Cognition: WNL  Sleep:  {BHH GOOD/FAIR/POOR:22877}   Screenings: AIMS    Flowsheet Row Admission (Discharged) from 02/16/2016 in [place] Total Score 0      AUDIT    Flowsheet Row Admission (Discharged) from 02/16/2016 in [place]  Alcohol Use Disorder Identification Test Final Score (AUDIT) 0      GAD-7    Flowsheet Row Office Visit from 08/30/2022 in [place] Office Visit from 06/14/2022 in [place]  Total GAD-7 Score 2 1      PHQ2-9    [place] Office Visit from 08/30/2022 in [place] Office Visit from 06/14/2022 in [place] Video Visit from 12/09/2021 in [place] Video Visit from 09/20/2021 in [place] Office Visit from 05/24/2021 in [place]  PHQ-2 Total Score 2 0 1 1 3  $ PHQ-9 Total Score 3 -- -- -- 8      Flowsheet Row Video Visit from 09/20/2021 in [place] Office Visit from 05/24/2021 in [place] No Risk Error: Q3, 4, or 5 should not be populated when Q2 is No        Assessment and Plan:  Mario Knapp is a 77 y.o. year old male with  a history of depression, hypertension, type II diabetes, sleep apnea with BiPAP, who presents for follow up appointment for below.   1. MDD (major depressive disorder), recurrent, in partial remission ([place]) He denies any significant mood symptoms except occasional anhedonia since the last visit. Psychosocial stressors includes demoralization in relate to aging, unemployment, financial strain, and conflict with the mother of his grandson, loss of his son from MVA at age 30.  He enjoys taking care of his grandchildren, and reports fair relationship with his wife.  Will continue current dose of Lexapro and bupropion to target depression.    2. Insomnia, unspecified type Improving.  He reports issues with BiPAP machine; he was advised to contact the provider about this.  Will continue zaleplon as needed for insomnia.    # r/o cognitive impairment His daughter is reportedly concerned about his memory loss/repeating himself, although he denies any concern.  IADL is independent except finances according to him.  Will continue to assess this.  He was advised to continue to receive care for hypertension.    Plan  Continue lexapro 10 mg daily Continue bupropion 150 mg daily  Continue zaleplon 10 mg at night as needed for insomnia  Next appointment- 1/16 at 11 AM for 30 mins, in person  (his wife's phone- [phone])     The patient demonstrates the following risk factors for suicide: Chronic risk factors for suicide include: psychiatric disorder of depression . Acute risk factors for suicide include: N/A. Protective factors for this patient include: positive social support and hope for the future. Considering these factors, the overall suicide risk at this point appears to be low. Patient is appropriate for outpatient follow up.      The patient demonstrates the following risk factors for suicide: Chronic risk factors for suicide include: psychiatric disorder of depression . Acute risk factors for  suicide include: N/A. Protective factors for this patient include: positive social support and hope for the future. Considering these factors, the overall suicide risk at this point appears to be low. Patient is appropriate for outpatient follow  up.         Collaboration of Care: Collaboration of Care: [facility] OP Collaboration of [id number]  Patient/Guardian was advised Release of Information must be obtained prior to any record release in order to collaborate their care with an outside provider. Patient/Guardian was advised if they have not already done so to contact the registration department to sign all necessary forms in order for [place] to release information regarding their care.   Consent: Patient/Guardian gives verbal consent for treatment and assignment of benefits for services provided during this visit. Patient/Guardian expressed understanding and agreed to proceed.    Norman Clay, MD 11/20/2022, 11:12 AM

## 2022-11-22 ENCOUNTER — Ambulatory Visit: Payer: PPO | Admitting: Psychiatry

## 2022-12-05 DIAGNOSIS — H43813 Vitreous degeneration, bilateral: Secondary | ICD-10-CM | POA: Diagnosis not present

## 2022-12-05 DIAGNOSIS — M3501 Sicca syndrome with keratoconjunctivitis: Secondary | ICD-10-CM | POA: Diagnosis not present

## 2022-12-05 DIAGNOSIS — E119 Type 2 diabetes mellitus without complications: Secondary | ICD-10-CM | POA: Diagnosis not present

## 2022-12-05 DIAGNOSIS — H2513 Age-related nuclear cataract, bilateral: Secondary | ICD-10-CM | POA: Diagnosis not present

## 2022-12-08 DIAGNOSIS — K219 Gastro-esophageal reflux disease without esophagitis: Secondary | ICD-10-CM | POA: Diagnosis not present

## 2022-12-08 DIAGNOSIS — E119 Type 2 diabetes mellitus without complications: Secondary | ICD-10-CM | POA: Diagnosis not present

## 2022-12-08 DIAGNOSIS — F332 Major depressive disorder, recurrent severe without psychotic features: Secondary | ICD-10-CM | POA: Diagnosis not present

## 2022-12-08 DIAGNOSIS — Z0000 Encounter for general adult medical examination without abnormal findings: Secondary | ICD-10-CM | POA: Diagnosis not present

## 2022-12-08 DIAGNOSIS — G4733 Obstructive sleep apnea (adult) (pediatric): Secondary | ICD-10-CM | POA: Diagnosis not present

## 2022-12-08 DIAGNOSIS — K760 Fatty (change of) liver, not elsewhere classified: Secondary | ICD-10-CM | POA: Diagnosis not present

## 2022-12-08 DIAGNOSIS — I10 Essential (primary) hypertension: Secondary | ICD-10-CM | POA: Diagnosis not present

## 2022-12-08 DIAGNOSIS — F5101 Primary insomnia: Secondary | ICD-10-CM | POA: Diagnosis not present

## 2022-12-08 DIAGNOSIS — E782 Mixed hyperlipidemia: Secondary | ICD-10-CM | POA: Diagnosis not present

## 2022-12-08 DIAGNOSIS — Z0001 Encounter for general adult medical examination with abnormal findings: Secondary | ICD-10-CM | POA: Diagnosis not present

## 2022-12-09 ENCOUNTER — Other Ambulatory Visit: Payer: Self-pay | Admitting: Psychiatry

## 2022-12-30 ENCOUNTER — Other Ambulatory Visit: Payer: Self-pay | Admitting: Psychiatry

## 2022-12-30 DIAGNOSIS — F3341 Major depressive disorder, recurrent, in partial remission: Secondary | ICD-10-CM

## 2022-12-30 DIAGNOSIS — G4700 Insomnia, unspecified: Secondary | ICD-10-CM

## 2022-12-30 NOTE — Telephone Encounter (Signed)
Ordered refill based on the request. Please contact him to make a follow up appointment. I will not be able to do any more refills without evaluation. Please also discuss attendance policy.

## 2023-01-06 NOTE — Telephone Encounter (Signed)
Patient scheduled for 03-13-23

## 2023-01-18 ENCOUNTER — Ambulatory Visit: Payer: PPO | Admitting: Urology

## 2023-01-31 ENCOUNTER — Other Ambulatory Visit: Payer: Self-pay | Admitting: Psychiatry

## 2023-01-31 DIAGNOSIS — G4700 Insomnia, unspecified: Secondary | ICD-10-CM

## 2023-01-31 DIAGNOSIS — F3341 Major depressive disorder, recurrent, in partial remission: Secondary | ICD-10-CM

## 2023-02-14 ENCOUNTER — Other Ambulatory Visit: Payer: Self-pay | Admitting: Urology

## 2023-02-19 DIAGNOSIS — E669 Obesity, unspecified: Secondary | ICD-10-CM | POA: Diagnosis not present

## 2023-02-19 DIAGNOSIS — G4733 Obstructive sleep apnea (adult) (pediatric): Secondary | ICD-10-CM | POA: Diagnosis not present

## 2023-03-03 ENCOUNTER — Other Ambulatory Visit: Payer: Self-pay | Admitting: Psychiatry

## 2023-03-09 NOTE — Progress Notes (Signed)
BH MD/PA/NP OP Progress Note  03/13/2023 10:08 AM Mario Knapp  MRN:  [id number]  Chief Complaint:  Chief Complaint  Patient presents with   Follow-up   HPI:  - he is not seen since Oct 2023 This is a follow-up appointment for depression, insomnia.  He states that he has been doing well.  He enjoys mowing the yard.  He reports good relationship with his grandchildren.  Although one of his grandson was admitted due to anemia secondary to UC, he has been doing better since then.  He has not been able to contact with his great grandchild.  One of his grandsons wife cut contact even with her mother (his daughter) after the birth of the child.  He thinks this is what it is, although they used to be very close.  He denies feeling depressed or anxiety.  His sleep 9 hours.  He feels comfortable trying not to take Sonata/reduce the dose.  He denies SI.  Although he reports memory loss with difficulty in remembering names of people, he denies any concern.   Substance use  Tobacco Alcohol Other substances/  Current denies Denies (does not like it) denies  Past Quit 42 years  denies denies  Past Treatment         Daily routine: hang out with his wife, taking his grandchildren to school (17, 42 yo grandson) Exercise: none Employment: retired, used to work at [employer], plant closed in 2008. On social security Support: sister, wife Household: wife Marital status: married for 42 years, married three times Number of children: 3 from previous relationship. 1 killed in MVA He states that he has good childhood.  His mother had "favorite "and he tried not to be around with his father when he is drunk.  He denies any abuse from him.  He enjoyed staying at his mother and grandfather's.   Wt Readings from Last 3 Encounters:  03/13/23 254 lb 12.8 oz (115.6 kg)  10/26/22 243 lb (110.2 kg)  08/30/22 250 lb (113.4 kg)     Visit Diagnosis:    ICD-10-CM   1. MDD (major depressive disorder),  recurrent, in partial remission (HCC)  F33.41 TSH    VITAMIN D 25 Hydroxy (Vit-D Deficiency, Fractures)    2. Insomnia, unspecified type  G47.00     3. Cognitive impairment  R41.89 Folate    Vitamin B12    4. Anxiety  F41.9 escitalopram (LEXAPRO) 10 MG tablet      Past Psychiatric History: Please see initial evaluation for full details. I have reviewed the history. No updates at this time.     Past Medical History:  Past Medical History:  Diagnosis Date   Arthritis    Depression    Hypertension    Sleep apnea     Past Surgical History:  Procedure Laterality Date   BALLOON DILATION N/A 05/11/2016   Procedure: BALLOON DILATION;  Surgeon: Scot Jun, MD;  Location: [facility] ENDOSCOPY;  Service: Endoscopy;  Laterality: N/A;   CHOLECYSTECTOMY     ESOPHAGOGASTRODUODENOSCOPY N/A 04/15/2016   Procedure: ESOPHAGOGASTRODUODENOSCOPY (EGD) with removal of foreign bodu;  Surgeon: Midge Minium, MD;  Location: [facility] ENDOSCOPY;  Service: Endoscopy;  Laterality: N/A;   ESOPHAGOGASTRODUODENOSCOPY (EGD) WITH PROPOFOL N/A 05/11/2016   Procedure: ESOPHAGOGASTRODUODENOSCOPY (EGD) WITH PROPOFOL;  Surgeon: Scot Jun, MD;  Location: [facility] ENDOSCOPY;  Service: Endoscopy;  Laterality: N/A;    Family Psychiatric History: Please see initial evaluation for full details. I have reviewed the history. No updates at  this time.     Family History:  Family History  Problem Relation Age of Onset   Depression Mother    Thyroid disease Mother    Alcohol abuse Father    Emphysema Father    Breast cancer Sister        two times   Depression Sister    Depression Sister    Alcohol abuse Brother    Alcohol abuse Paternal Aunt     Social History:  Social History   Socioeconomic History   Marital status: Married    Spouse name: peggy    Number of children: 4   Years of education: Not on file   Highest education level: 9th grade  Occupational History   Occupation: retired  Tobacco Use   Smoking  status: Former    Types: Cigarettes    Quit date: 02/25/1981    Years since quitting: 42.0   Smokeless tobacco: Former    Quit date: 02/25/1981  Vaping Use   Vaping Use: Never used  Substance and Sexual Activity   Alcohol use: No   Drug use: No   Sexual activity: Yes  Other Topics Concern   Not on file  Social History Narrative   Not on file   Social Determinants of Health   Financial Resource Strain: Low Risk  (09/11/2017)   Overall Financial Resource Strain (CARDIA)    Difficulty of Paying Living Expenses: Not hard at all  Food Insecurity: No Food Insecurity (09/11/2017)   Hunger Vital Sign    Worried About Running Out of Food in the Last Year: Never true    Ran Out of Food in the Last Year: Never true  Transportation Needs: No Transportation Needs (09/11/2017)   PRAPARE - Administrator, Civil Service (Medical): No    Lack of Transportation (Non-Medical): No  Physical Activity: Sufficiently Active (09/11/2017)   Exercise Vital Sign    Days of Exercise per Week: 3 days    Minutes of Exercise per Session: 60 min  Stress: No Stress Concern Present (09/11/2017)   [employer] of Occupational Health - Occupational Stress Questionnaire    Feeling of Stress : Not at all  Social Connections: Moderately Integrated (09/11/2017)   Social Connection and Isolation Panel [NHANES]    Frequency of Communication with Friends and Family: Once a week    Frequency of Social Gatherings with Friends and Family: Never    Attends Religious Services: More than 4 times per year    Active Member of Clubs or Organizations: Yes    Attends Banker Meetings: More than 4 times per year    Marital Status: Married    Allergies: No Known Allergies  Metabolic Disorder Labs: Lab Results  Component Value Date   HGBA1C 5.4 02/17/2016   No results found for: "PROLACTIN" Lab Results  Component Value Date   CHOL 213 (H) 06/28/2012   TRIG 227 (H) 06/28/2012   HDL 35 (L)  06/28/2012   VLDL 45 (H) 06/28/2012   LDLCALC 133 (H) 06/28/2012   LDLCALC 118 (H) 12/22/2011   Lab Results  Component Value Date   TSH 2.059 05/24/2021   TSH 2.997 02/17/2016    Therapeutic Level Labs: Lab Results  Component Value Date   LITHIUM 0.32 (L) 05/24/2021   No results found for: "VALPROATE" No results found for: "CBMZ"  Current Medications: Current Outpatient Medications  Medication Sig Dispense Refill   amoxicillin (AMOXIL) 500 MG tablet Take 500 mg by mouth 3 (three)  times daily.     finasteride (PROSCAR) 5 MG tablet TAKE 1 TABLET (5 MG TOTAL) BY MOUTH DAILY. 90 tablet 3   furosemide (LASIX) 20 MG tablet Take 1 tablet by mouth daily.     KLOR-CON M20 20 MEQ tablet Take 20 mEq by mouth daily.     lisinopril (ZESTRIL) 5 MG tablet Take 5 mg by mouth daily.     lisinopril (ZESTRIL) 5 MG tablet Take 1 tablet by mouth daily.     metFORMIN (GLUCOPHAGE) 500 MG tablet Take by mouth.     Multiple Vitamin (MULTI-VITAMINS) TABS Take by mouth.     pantoprazole (PROTONIX) 40 MG tablet Take by mouth.     pantoprazole (PROTONIX) 40 MG tablet Take 1 tablet by mouth daily.     vitamin B-12 (CYANOCOBALAMIN) 1000 MCG tablet Take 1,000 mcg by mouth daily.     zaleplon (SONATA) 5 MG capsule Take 1 capsule (5 mg total) by mouth at bedtime as needed for sleep. 30 capsule 1   [START ON 03/30/2023] buPROPion (WELLBUTRIN XL) 150 MG 24 hr tablet Take 1 tablet (150 mg total) by mouth daily. 90 tablet 0   escitalopram (LEXAPRO) 10 MG tablet Take 1 tablet (10 mg total) by mouth daily. 90 tablet 1   No current facility-administered medications for this visit.     Musculoskeletal: Strength & Muscle Tone: within normal limits Gait & Station: normal Patient leans: N/A  Psychiatric Specialty Exam: Review of Systems  Psychiatric/Behavioral:  Negative for agitation, behavioral problems, confusion, decreased concentration, dysphoric mood, hallucinations, self-injury, sleep disturbance and  suicidal ideas. The patient is not nervous/anxious and is not hyperactive.   All other systems reviewed and are negative.   Blood pressure (!) 154/76, pulse 66, temperature (!) 97.5 F (36.4 C), temperature source Skin, height 5\' 10"  (1.778 m), weight 254 lb 12.8 oz (115.6 kg).Body mass index is 36.56 kg/m.  General Appearance: Fairly Groomed  Eye Contact:  Good  Speech:  Clear and Coherent  Volume:  Normal  Mood:   good  Affect:  Appropriate, Congruent, and calm  Thought Process:  Coherent  Orientation:  Full (Time, Place, and Person)  Thought Content: Logical   Suicidal Thoughts:  No  Homicidal Thoughts:  No  Memory:  Immediate;   Good  Judgement:  Good  Insight:  Good  Psychomotor Activity:  Normal  Concentration:  Concentration: Good and Attention Span: Good  Recall:  Good  Fund of Knowledge: Good  Language: Good  Akathisia:  No  Handed:  Right  AIMS (if indicated): not done  Assets:  Communication Skills Desire for Improvement  ADL's:  Intact  Cognition: WNL  Sleep:  Good   Screenings: AIMS    Flowsheet Row Admission (Discharged) from 02/16/2016 in [facility]  AIMS Total Score 0      AUDIT    Flowsheet Row Admission (Discharged) from 02/16/2016 in [facility]  Alcohol Use Disorder Identification Test Final Score (AUDIT) 0      GAD-7    Flowsheet Row Office Visit from 08/30/2022 in [facility] Office Visit from 06/14/2022 in [facility]  Total GAD-7 Score 2 1      PHQ2-9    Flowsheet Row Office Visit from 03/13/2023 in [facility] Office Visit from 08/30/2022 in [facility] Office Visit from 06/14/2022 in [facility] Video Visit from 12/09/2021 in  [facility] Video Visit from  09/20/2021 in [facility]  PHQ-2 Total Score 0 2 0 1 1  PHQ-9 Total Score -- 3 -- -- --      Flowsheet Row Video Visit from 09/20/2021 in [facility] Office Visit from 05/24/2021 in [facility]  C-SSRS RISK CATEGORY No Risk Error: Q3, 4, or 5 should not be populated when Q2 is No        Assessment and Plan:  Mario Knapp is a 77 y.o. year old male with a history of depression, hypertension, type II diabetes, sleep apnea with BiPAP, who presents for follow up appointment for below.   1. MDD (major depressive disorder), recurrent, in partial remission (HCC) Acute stressors include:  Other stressors include: aging, unemployment, financial strain, conflict with the mother of his grandchild (unable to see him), loss of his son from MVA at age 67     History:   He denies any Sainani for current mood symptoms since the last visit.  He enjoys interaction with his most of his children, and reports fair relationship with his wife.  Will continue current dose of Lexapro in the bupropion to target depression.   2. Insomnia, unspecified type - uses BiPAP regularly. On 10 mg since 04/2021, tapered down 03/2023 Improving.  He is willing to try taper down the dose of Zaleplon, and uses it as PRN.   3. Cognitive impairment Functional Status   IADL: Independent in the following:  medications, driving           Requires assistance with the following: managing finances (his wife handles it for many years) ADL  Independent in the following: bathing and hygiene, feeding, continence, grooming and toileting, walking          Requires assistance with the following: Folate, Vtamin B12, TSH Images: not available  Neuropsych assessment: Clock drawing 1/3 (Draw a clock with the numbers 12, 10, 11, 3, 6, and "45". Add arrows pointing towards 10 and 11, positioned on the upper right corner  of the clock.), oriented, delayed recall 3/3 03/2023 Etiology:   He has subjective complaints of memory loss, although he denies any issues with his functioning.  The exam reveals cognitive deficits in clock drawing, although it's uncertain whether they're related to the patient's education level (9th grade).  Will obtain labs to rule out medical health issues contributing to his symptoms.   Plan  Continue lexapro 10 mg daily Continue bupropion 150 mg daily  Decrease zaleplon 5 mg at night as needed for insomnia  Obtain labs (TSH, vitamin D, folate, vitamin B12) Next appointment- 6/26 at 2 PM for 30 mins, video (his wife's phone- [phone])    The patient demonstrates the following risk factors for suicide: Chronic risk factors for suicide include: psychiatric disorder of depression . Acute risk factors for suicide include: N/A. Protective factors for this patient include: positive social support and hope for the future. Considering these factors, the overall suicide risk at this point appears to be low. Patient is appropriate for outpatient follow up.      The patient demonstrates the following risk factors for suicide: Chronic risk factors for suicide include: psychiatric disorder of depression . Acute risk factors for suicide include: N/A. Protective factors for this patient include: positive social support and hope for the future. Considering these factors, the overall suicide risk at this point appears to be low. Patient  is appropriate for outpatient follow up.       Collaboration of Care: Collaboration of Care: Other reviewed notes in Epic  Patient/Guardian was advised Release of Information must be obtained prior to any record release in order to collaborate their care with an outside provider. Patient/Guardian was advised if they have not already done so to contact the registration department to sign all necessary forms in order for [place] to release information regarding their care.    Consent: Patient/Guardian gives verbal consent for treatment and assignment of benefits for services provided during this visit. Patient/Guardian expressed understanding and agreed to proceed.    Neysa Hotter, MD 03/13/2023, 10:08 AM

## 2023-03-13 ENCOUNTER — Ambulatory Visit: Payer: PPO | Admitting: Psychiatry

## 2023-03-13 ENCOUNTER — Encounter: Payer: Self-pay | Admitting: Psychiatry

## 2023-03-13 ENCOUNTER — Other Ambulatory Visit
Admission: RE | Admit: 2023-03-13 | Discharge: 2023-03-13 | Disposition: A | Discharge status: Home / Self Care | Payer: PPO | Attending: Psychiatry | Admitting: Psychiatry

## 2023-03-13 VITALS — BP 154/76 | HR 66 | Temp 97.5°F | Ht 70.0 in | Wt 254.8 lb

## 2023-03-13 DIAGNOSIS — G4700 Insomnia, unspecified: Secondary | ICD-10-CM | POA: Insufficient documentation

## 2023-03-13 DIAGNOSIS — F3341 Major depressive disorder, recurrent, in partial remission: Secondary | ICD-10-CM

## 2023-03-13 DIAGNOSIS — R4189 Other symptoms and signs involving cognitive functions and awareness: Secondary | ICD-10-CM

## 2023-03-13 DIAGNOSIS — E559 Vitamin D deficiency, unspecified: Secondary | ICD-10-CM | POA: Insufficient documentation

## 2023-03-13 DIAGNOSIS — F419 Anxiety disorder, unspecified: Secondary | ICD-10-CM | POA: Diagnosis not present

## 2023-03-13 LAB — VITAMIN D 25 HYDROXY (VIT D DEFICIENCY, FRACTURES): Vit D, 25-Hydroxy: 51.93 ng/mL (ref 30–100)

## 2023-03-13 LAB — FOLATE: Folate: 33 ng/mL (ref >5.9)

## 2023-03-13 LAB — TSH: TSH: 1.288 u[IU]/mL (ref 0.350–4.500)

## 2023-03-13 LAB — VITAMIN B12: Vitamin B-12: 857 pg/mL (ref 180–914)

## 2023-03-13 MED ORDER — ZALEPLON 5 MG PO CAPS: 5.0000 mg | ORAL_CAPSULE | Freq: Every evening | ORAL | 1 refills | Status: AC | PRN

## 2023-03-13 MED ORDER — ESCITALOPRAM OXALATE 10 MG PO TABS: 10.0000 mg | ORAL_TABLET | Freq: Every day | ORAL | 1 refills | Status: DC

## 2023-03-13 MED ORDER — BUPROPION HCL ER (XL) 150 MG PO TB24: 150.0000 mg | ORAL_TABLET | Freq: Every day | ORAL | 0 refills | Status: AC

## 2023-03-13 NOTE — Patient Instructions (Signed)
Continue lexapro 10 mg daily Continue bupropion 150 mg daily  Decrease zaleplon 5 mg at night as needed for insomnia  Obtain labs (TSH, vitamin D, folate, vitamin B12) Next appointment- 6/26 at 2 PM

## 2023-04-04 DIAGNOSIS — E119 Type 2 diabetes mellitus without complications: Secondary | ICD-10-CM | POA: Diagnosis not present

## 2023-04-11 DIAGNOSIS — E119 Type 2 diabetes mellitus without complications: Secondary | ICD-10-CM | POA: Diagnosis not present

## 2023-04-11 DIAGNOSIS — F332 Major depressive disorder, recurrent severe without psychotic features: Secondary | ICD-10-CM | POA: Diagnosis not present

## 2023-04-11 DIAGNOSIS — G4733 Obstructive sleep apnea (adult) (pediatric): Secondary | ICD-10-CM | POA: Diagnosis not present

## 2023-04-11 DIAGNOSIS — E782 Mixed hyperlipidemia: Secondary | ICD-10-CM | POA: Diagnosis not present

## 2023-04-11 DIAGNOSIS — I10 Essential (primary) hypertension: Secondary | ICD-10-CM | POA: Diagnosis not present

## 2023-04-11 DIAGNOSIS — K219 Gastro-esophageal reflux disease without esophagitis: Secondary | ICD-10-CM | POA: Diagnosis not present

## 2023-04-17 ENCOUNTER — Ambulatory Visit (INDEPENDENT_AMBULATORY_CARE_PROVIDER_SITE_OTHER): Payer: PPO | Admitting: Internal Medicine

## 2023-04-17 VITALS — BP 142/77 | HR 76 | Resp 16 | Ht 70.0 in | Wt 257.0 lb

## 2023-04-17 DIAGNOSIS — E669 Obesity, unspecified: Secondary | ICD-10-CM | POA: Diagnosis not present

## 2023-04-17 DIAGNOSIS — G4733 Obstructive sleep apnea (adult) (pediatric): Secondary | ICD-10-CM | POA: Diagnosis not present

## 2023-04-17 DIAGNOSIS — I10 Essential (primary) hypertension: Secondary | ICD-10-CM | POA: Diagnosis not present

## 2023-04-17 DIAGNOSIS — Z7189 Other specified counseling: Secondary | ICD-10-CM | POA: Diagnosis not present

## 2023-04-17 NOTE — Progress Notes (Unsigned)
[facility] [address] [place], [place] [address]  Pulmonary Sleep Medicine   Office Visit Note  Patient Name: Mario Knapp DOB: 27-Jan-1946 MRN [id number]    Chief Complaint: Obstructive Sleep Apnea visit  Brief History:  Mario Knapp is seen today for an annual follow up on BIPAP at 17/13 cmh20.  The patient has a 13 year history of sleep apnea. Patient is using PAP nightly.  The patient feels rested after sleeping with PAP.  The patient reports benefiting from PAP use. Reported sleepiness is  improved and the Epworth Sleepiness Score is 3 out of 24. The patient sometimes take naps. The patient complains of the following: His machine has been making a loud whining noise.  The machine also has an end of life message displayed.  The compliance download shows 100% compliance with an average use time of 9:59 hours. The AHI is 1.0.  The patient does not complain of limb movements disrupting sleep.  ROS  General: (-) fever, (-) chills, (-) night sweat Nose and Sinuses: (-) nasal stuffiness or itchiness, (-) postnasal drip, (-) nosebleeds, (-) sinus trouble. Mouth and Throat: (-) sore throat, (-) hoarseness. Neck: (-) swollen glands, (-) enlarged thyroid, (-) neck pain. Respiratory: - cough, + shortness of breath, - wheezing. Neurologic: - numbness, - tingling. Psychiatric: - anxiety, + depression   Current Medication: Outpatient Encounter Medications as of 04/17/2023  Medication Sig   atorvastatin (LIPITOR) 10 MG tablet Take 10 mg by mouth daily.   buPROPion (WELLBUTRIN XL) 150 MG 24 hr tablet Take 1 tablet (150 mg total) by mouth daily.   escitalopram (LEXAPRO) 10 MG tablet Take 1 tablet (10 mg total) by mouth daily.   finasteride (PROSCAR) 5 MG tablet TAKE 1 TABLET (5 MG TOTAL) BY MOUTH DAILY.   furosemide (LASIX) 20 MG tablet Take 1 tablet by mouth daily.   KLOR-CON M20 20 MEQ tablet Take 20 mEq by mouth daily.   lisinopril (ZESTRIL) 5 MG tablet Take 1 tablet by  mouth daily.   metFORMIN (GLUCOPHAGE) 500 MG tablet Take by mouth.   Multiple Vitamin (MULTI-VITAMINS) TABS Take by mouth.   pantoprazole (PROTONIX) 40 MG tablet Take by mouth.   vitamin B-12 (CYANOCOBALAMIN) 1000 MCG tablet Take 1,000 mcg by mouth daily.   zaleplon (SONATA) 5 MG capsule Take 1 capsule (5 mg total) by mouth at bedtime as needed for sleep.   [DISCONTINUED] amoxicillin (AMOXIL) 500 MG tablet Take 500 mg by mouth 3 (three) times daily.   [DISCONTINUED] lisinopril (ZESTRIL) 5 MG tablet Take 5 mg by mouth daily.   [DISCONTINUED] pantoprazole (PROTONIX) 40 MG tablet Take 1 tablet by mouth daily.   No facility-administered encounter medications on file as of 04/17/2023.    Surgical History: Past Surgical History:  Procedure Laterality Date   BALLOON DILATION N/A 05/11/2016   Procedure: BALLOON DILATION;  Surgeon: Scot Jun, MD;  Location: [facility] ENDOSCOPY;  Service: Endoscopy;  Laterality: N/A;   CHOLECYSTECTOMY     ESOPHAGOGASTRODUODENOSCOPY N/A 04/15/2016   Procedure: ESOPHAGOGASTRODUODENOSCOPY (EGD) with removal of foreign bodu;  Surgeon: Midge Minium, MD;  Location: [facility] ENDOSCOPY;  Service: Endoscopy;  Laterality: N/A;   ESOPHAGOGASTRODUODENOSCOPY (EGD) WITH PROPOFOL N/A 05/11/2016   Procedure: ESOPHAGOGASTRODUODENOSCOPY (EGD) WITH PROPOFOL;  Surgeon: Scot Jun, MD;  Location[facility] ENDOSCOPY;  Service: Endoscopy;  Laterality: N/A;    Medical History: Past Medical History:  Diagnosis Date   Arthritis    Depression    Hypertension    Sleep apnea  Family History: Non contributory to the present illness  Social History: Social History   Socioeconomic History   Marital status: Married    Spouse name: peggy    Number of children: 4   Years of education: Not on file   Highest education level: 9th grade  Occupational History   Occupation: retired  Tobacco Use   Smoking status: Former    Types: Cigarettes    Quit date: 02/25/1981    Years since  quitting: 42.1   Smokeless tobacco: Former    Quit date: 02/25/1981  Vaping Use   Vaping Use: Never used  Substance and Sexual Activity   Alcohol use: No   Drug use: No   Sexual activity: Yes  Other Topics Concern   Not on file  Social History Narrative   Not on file   Social Determinants of Health   Financial Resource Strain: Low Risk  (09/11/2017)   Overall Financial Resource Strain (CARDIA)    Difficulty of Paying Living Expenses: Not hard at all  Food Insecurity: No Food Insecurity (09/11/2017)   Hunger Vital Sign    Worried About Running Out of Food in the Last Year: Never true    Ran Out of Food in the Last Year: Never true  Transportation Needs: No Transportation Needs (09/11/2017)   PRAPARE - Administrator, Civil Service (Medical): No    Lack of Transportation (Non-Medical): No  Physical Activity: Sufficiently Active (09/11/2017)   Exercise Vital Sign    Days of Exercise per Week: 3 days    Minutes of Exercise per Session: 60 min  Stress: No Stress Concern Present (09/11/2017)   [employer] of Occupational Health - Occupational Stress Questionnaire    Feeling of Stress : Not at all  Social Connections: Moderately Integrated (09/11/2017)   Social Connection and Isolation Panel [NHANES]    Frequency of Communication with Friends and Family: Once a week    Frequency of Social Gatherings with Friends and Family: Never    Attends Religious Services: More than 4 times per year    Active Member of [employer] or Organizations: Yes    Attends Engineer, structural: More than 4 times per year    Marital Status: Married  Catering manager Violence: Not At Risk (09/11/2017)   Humiliation, Afraid, Rape, and Kick questionnaire    Fear of Current or Ex-Partner: No    Emotionally Abused: No    Physically Abused: No    Sexually Abused: No    Vital Signs: Blood pressure (!) 142/77, pulse 76, resp. rate 16, height 5\' 10"  (1.778 m), weight 257 lb (116.6 kg), SpO2 95  %. Body mass index is 36.88 kg/m.    Examination: General Appearance: The patient is well-developed, well-nourished, and in no distress. Neck Circumference: 52 cm Skin: Gross inspection of skin unremarkable. Head: normocephalic, no gross deformities. Eyes: no gross deformities noted. ENT: ears appear grossly normal Neurologic: Alert and oriented. No involuntary movements.  STOP BANG RISK ASSESSMENT S (snore) Have you been told that you snore?     YES   T (tired) Are you often tired, fatigued, or sleepy during the day?   NO  O (obstruction) Do you stop breathing, choke, or gasp during sleep? NO   P (pressure) Do you have or are you being treated for high blood pressure? YES   B (BMI) Is your body index greater than 35 kg/m? YES   A (age) Are you 20 years old or older? YES  N (neck) Do you have a neck circumference greater than 16 inches?   YES   G (gender) Are you a male? YES   TOTAL STOP/BANG "YES" ANSWERS 6       A STOP-Bang score of 2 or less is considered low risk, and a score of 5 or more is high risk for having either moderate or severe OSA. For people who score 3 or 4, doctors may need to perform further assessment to determine how likely they are to have OSA.         EPWORTH SLEEPINESS SCALE:  Scale:  (0)= no chance of dozing; (1)= slight chance of dozing; (2)= moderate chance of dozing; (3)= high chance of dozing  Chance  Situtation    Sitting and reading: 0    Watching TV: 0    Sitting Inactive in public: 0    As a passenger in car: 0      Lying down to rest: 3    Sitting and talking: 0    Sitting quielty after lunch: 0    In a car, stopped in traffic: 0   TOTAL SCORE:   3 out of 24    SLEEP STUDIES:  PSG (07/03/09) AHI 77, min SPO2 50%   CPAP COMPLIANCE DATA:  Date Range: 04/18/22 - 04/17/23  Average Daily Use: 9:59 hours  Median Use: 9:52 hours  Compliance for > 4 Hours: 364 days  AHI: 1.0 respiratory events per hour  Days  Used: 364/365  Mask Leak: 6.7  95th Percentile Pressure: 17/13 cmh20         LABS: Recent Results (from the past 2160 hour(s))  VITAMIN D 25 Hydroxy (Vit-D Deficiency, Fractures)     Status: None   Collection Time: 03/13/23 10:34 AM  Result Value Ref Range   Vit D, 25-Hydroxy 51.93 30 - 100 ng/mL    Comment: (NOTE) Vitamin D deficiency has been defined by the Institute of Medicine  and an Endocrine Society practice guideline as a level of serum 25-OH  vitamin D less than 20 ng/mL (1,2). The Endocrine Society went on to  further define vitamin D insufficiency as a level between 21 and 29  ng/mL (2).  1. IOM (Institute of Medicine). 2010. Dietary reference intakes for  calcium and D. [place]: [employer]. 2. Holick MF, Binkley Hoopa, Bischoff-Ferrari HA, et al. Evaluation,  treatment, and prevention of vitamin D deficiency: an Endocrine  Society clinical practice guideline, JCEM. 2011 Jul; 96(7): 1911-30.  Performed at [facility], [address], [place], [place] [address]   Vitamin B12     Status: None   Collection Time: 03/13/23 10:34 AM  Result Value Ref Range   Vitamin B-12 857 180 - 914 pg/mL    Comment: (NOTE) This assay is not validated for testing neonatal or myeloproliferative syndrome specimens for Vitamin B12 levels. Performed at [facility], [address], [place], [place] [address]   Folate     Status: None   Collection Time: 03/13/23 10:34 AM  Result Value Ref Range   Folate 33.0 >5.9 ng/mL    Comment: Performed at [facility], [address] [place], [place] [address]  TSH     Status: None   Collection Time: 03/13/23 10:34 AM  Result Value Ref Range   TSH 1.288 0.350 - 4.500 uIU/mL    Comment: Performed by a 3rd Generation assay with a functional sensitivity of <=0.01 uIU/mL. Performed at [facility],  [address] [place], [place] [address]     Radiology: No results found.  No  results found.  No results found.    Assessment and Plan: Patient Active Problem List   Diagnosis Date Noted   Encounter for BiPAP use counseling 04/18/2022   OSA treated with BiPAP 04/19/2021   Obesity (BMI 30-39.9) 04/19/2021   Severe obesity (BMI >= 40) (HCC) 03/25/2021   Controlled type 2 diabetes mellitus without complication, without long-term current use of insulin (HCC) 04/22/2020   Erectile dysfunction due to arterial insufficiency 01/09/2020   Hematospermia 01/08/2020   Insomnia 10/29/2019   Fatty liver disease, nonalcoholic 06/26/2018   Esophageal foreign body    Stricture and stenosis of esophagus    Adiposity 03/29/2016   Major depressive disorder, recurrent episode (HCC) 03/01/2016   Social anxiety disorder 03/01/2016   B12 deficiency 02/26/2016   OSA (obstructive sleep apnea) 02/23/2016   BPH without obstruction/lower urinary tract symptoms 02/16/2016   Osteoarthritis 06/11/2014   Hypertension 06/11/2014   Hyperlipidemia, unspecified 06/11/2014   1. OSA treated with BiPAP The patient does tolerate PAP and reports  benefit from PAP use. The patient was reminded how to clean equipment and advised to replace supplies routinely. The patient was also counselled on weight loss. The compliance is excellent. The AHI is 1.0. Machine is past end of usable life and must be replaced.   OSA on cpap- controlled. Replace machine. Continue with excellent compliance with pap. CPAP continues to be medically necessary to treat this patient's OSA. F/u 30d post setup for new machine.  2. Encounter for BiPAP use counseling CPAP Counseling: had a lengthy discussion with the patient regarding the importance of PAP therapy in management of the sleep apnea. Patient appears to understand the risk factor reduction and also understands the risks associated with untreated sleep apnea. Patient will try to make a good faith effort to remain compliant with therapy. Also instructed the patient on  proper cleaning of the device including the water must be changed daily if possible and use of distilled water is preferred. Patient understands that the machine should be regularly cleaned with appropriate recommended cleaning solutions that do not damage the PAP machine for example given white vinegar and water rinses. Other methods such as ozone treatment may not be as good as these simple methods to achieve cleaning.   3. Hypertension, unspecified type Hypertension Counseling:   The following hypertensive lifestyle modification were recommended and discussed:  1. Limiting alcohol intake to less than 1 oz/day of ethanol:(24 oz of beer or 8 oz of wine or 2 oz of 100-proof whiskey). 2. Take baby ASA 81 mg daily. 3. Importance of regular aerobic exercise and losing weight. 4. Reduce dietary saturated fat and cholesterol intake for overall cardiovascular health. 5. Maintaining adequate dietary potassium, calcium, and magnesium intake. 6. Regular monitoring of the blood pressure. 7. Reduce sodium intake to less than 100 mmol/day (less than 2.3 gm of sodium or less than 6 gm of sodium choride)    4. Obesity (BMI 30-39.9) Obesity Counseling: Had a lengthy discussion regarding patients BMI and weight issues. Patient was instructed on portion control as well as increased activity. Also discussed caloric restrictions with trying to maintain intake less than 2000 Kcal. Discussions were made in accordance with the 5As of weight management. Simple actions such as not eating late and if able to, taking a walk is suggested.      General Counseling: I have discussed the findings of the evaluation  and examination with Mario Knapp.  I have also discussed any further diagnostic evaluation thatmay be needed or ordered today. Mario Knapp verbalizes understanding of the findings of todays visit. We also reviewed his medications today and discussed drug interactions and side effects including but not limited excessive drowsiness  and altered mental states. We also discussed that there is always a risk not just to him but also people around him. he has been encouraged to call the office with any questions or concerns that should arise related to todays visit.  No orders of the defined types were placed in this encounter.       I have personally obtained a history, examined the patient, evaluated laboratory and imaging results, formulated the assessment and plan and placed orders. This patient was seen today by Emmaline Kluver, PA-C in collaboration with Dr. Freda Munro.   Yevonne Pax, MD [facility] Diplomate ABMS Pulmonary Critical Care Medicine and Sleep Medicine

## 2023-04-17 NOTE — Patient Instructions (Signed)

## 2023-05-03 ENCOUNTER — Telehealth: Payer: PPO | Admitting: Psychiatry

## 2023-05-04 NOTE — Progress Notes (Signed)
BH MD/PA/NP OP Progress Note  05/09/2023 9:35 AM Mario Knapp  MRN:  [id number]  Chief Complaint:  Chief Complaint  Patient presents with   Follow-up   HPI:  This is a follow-up appointment for depression, insomnia.  He states that today's going to be hard. There is a funeral of his grandson's half brother. He was like a real grandson to him.  He also states that this reminds him of his son, who died in MVA. it has been hard for his wife, and his children.  However, he has good support with each other.  He also tries to see the different aspects of it.  He spends time watching TV, doing paint by numbers on tablet. He takes a walk at times, although he does not mow yard anymore as his family has been working on this.  He sleeps well despite lowering the dose of zaleplon. He is willing to take less of this medication.  He is hoping to work on diet given he gained some weight.  He denies SI.  He denies anxiety.   Wt Readings from Last 3 Encounters:  05/09/23 260 lb 12.8 oz (118.3 kg)  04/17/23 257 lb (116.6 kg)  03/13/23 254 lb 12.8 oz (115.6 kg)      Substance use   Tobacco Alcohol Other substances/  Current denies Denies (does not like it) denies  Past Quit 42 years  denies denies  Past Treatment              Daily routine: hang out with his wife, taking his grandchildren to school (53, 46 yo grandson) Exercise: none Employment: retired, used to work at [employer], plant closed in 2008. On social security Support: sister, wife Household: wife Marital status: married for 42 years, married three times Number of children: 3 from previous relationship. 1 killed in MVA He states that he has good childhood.  His mother had "favorite "and he tried not to be around with his father when he is drunk.  He denies any abuse from him.  He enjoyed staying at his mother and grandfather's.   Visit Diagnosis:    ICD-10-CM   1. MDD (major depressive disorder), recurrent, in partial remission  (HCC)  F33.41     2. Insomnia, unspecified type  G47.00     3. Cognitive impairment  R41.89       Past Psychiatric History: Please see initial evaluation for full details. I have reviewed the history. No updates at this time.     Past Medical History:  Past Medical History:  Diagnosis Date   Arthritis    Depression    Hypertension    Sleep apnea     Past Surgical History:  Procedure Laterality Date   BALLOON DILATION N/A 05/11/2016   Procedure: BALLOON DILATION;  Surgeon: Scot Jun, MD;  Location: [facility] ENDOSCOPY;  Service: Endoscopy;  Laterality: N/A;   CHOLECYSTECTOMY     ESOPHAGOGASTRODUODENOSCOPY N/A 04/15/2016   Procedure: ESOPHAGOGASTRODUODENOSCOPY (EGD) with removal of foreign bodu;  Surgeon: Midge Minium, MD;  Location: [facility] ENDOSCOPY;  Service: Endoscopy;  Laterality: N/A;   ESOPHAGOGASTRODUODENOSCOPY (EGD) WITH PROPOFOL N/A 05/11/2016   Procedure: ESOPHAGOGASTRODUODENOSCOPY (EGD) WITH PROPOFOL;  Surgeon: Scot Jun, MD;  Location: [facility] ENDOSCOPY;  Service: Endoscopy;  Laterality: N/A;    Family Psychiatric History: Please see initial evaluation for full details. I have reviewed the history. No updates at this time.     Family History:  Family History  Problem Relation Age of Onset  Depression Mother    Thyroid disease Mother    Alcohol abuse Father    Emphysema Father    Breast cancer Sister        two times   Depression Sister    Depression Sister    Alcohol abuse Brother    Alcohol abuse Paternal Aunt     Social History:  Social History   Socioeconomic History   Marital status: Married    Spouse name: peggy    Number of children: 4   Years of education: Not on file   Highest education level: 9th grade  Occupational History   Occupation: retired  Tobacco Use   Smoking status: Former    Types: Cigarettes    Quit date: 02/25/1981    Years since quitting: 42.2   Smokeless tobacco: Former    Quit date: 02/25/1981  Vaping Use   Vaping Use:  Never used  Substance and Sexual Activity   Alcohol use: No   Drug use: No   Sexual activity: Yes  Other Topics Concern   Not on file  Social History Narrative   Not on file   Social Determinants of Health   Financial Resource Strain: Low Risk  (09/11/2017)   Overall Financial Resource Strain (CARDIA)    Difficulty of Paying Living Expenses: Not hard at all  Food Insecurity: No Food Insecurity (09/11/2017)   Hunger Vital Sign    Worried About Running Out of Food in the Last Year: Never true    Ran Out of Food in the Last Year: Never true  Transportation Needs: No Transportation Needs (09/11/2017)   PRAPARE - Administrator, Civil Service (Medical): No    Lack of Transportation (Non-Medical): No  Physical Activity: Sufficiently Active (09/11/2017)   Exercise Vital Sign    Days of Exercise per Week: 3 days    Minutes of Exercise per Session: 60 min  Stress: No Stress Concern Present (09/11/2017)   [employer] of Occupational Health - Occupational Stress Questionnaire    Feeling of Stress : Not at all  Social Connections: Moderately Integrated (09/11/2017)   Social Connection and Isolation Panel [NHANES]    Frequency of Communication with Friends and Family: Once a week    Frequency of Social Gatherings with Friends and Family: Never    Attends Religious Services: More than 4 times per year    Active Member of Clubs or Organizations: Yes    Attends Banker Meetings: More than 4 times per year    Marital Status: Married    Allergies: No Known Allergies  Metabolic Disorder Labs: Lab Results  Component Value Date   HGBA1C 5.4 02/17/2016   No results found for: "PROLACTIN" Lab Results  Component Value Date   CHOL 213 (H) 06/28/2012   TRIG 227 (H) 06/28/2012   HDL 35 (L) 06/28/2012   VLDL 45 (H) 06/28/2012   LDLCALC 133 (H) 06/28/2012   LDLCALC 118 (H) 12/22/2011   Lab Results  Component Value Date   TSH 1.288 03/13/2023   TSH 2.059  05/24/2021    Therapeutic Level Labs: Lab Results  Component Value Date   LITHIUM 0.32 (L) 05/24/2021   No results found for: "VALPROATE" No results found for: "CBMZ"  Current Medications: Current Outpatient Medications  Medication Sig Dispense Refill   atorvastatin (LIPITOR) 10 MG tablet Take 10 mg by mouth daily.     buPROPion (WELLBUTRIN XL) 150 MG 24 hr tablet Take 1 tablet (150 mg total) by mouth  daily. 90 tablet 0   escitalopram (LEXAPRO) 10 MG tablet Take 1 tablet (10 mg total) by mouth daily. 90 tablet 1   finasteride (PROSCAR) 5 MG tablet TAKE 1 TABLET (5 MG TOTAL) BY MOUTH DAILY. 90 tablet 3   furosemide (LASIX) 20 MG tablet Take 1 tablet by mouth daily.     KLOR-CON M20 20 MEQ tablet Take 20 mEq by mouth daily.     lisinopril (ZESTRIL) 5 MG tablet Take 1 tablet by mouth daily.     metFORMIN (GLUCOPHAGE) 500 MG tablet Take by mouth.     Multiple Vitamin (MULTI-VITAMINS) TABS Take by mouth.     pantoprazole (PROTONIX) 40 MG tablet Take by mouth.     vitamin B-12 (CYANOCOBALAMIN) 1000 MCG tablet Take 1,000 mcg by mouth daily.     zaleplon (SONATA) 5 MG capsule Take 1 capsule (5 mg total) by mouth at bedtime as needed for sleep. 30 capsule 1   No current facility-administered medications for this visit.     Musculoskeletal: Strength & Muscle Tone:  normal Gait & Station:  normal Patient leans: N/A  Psychiatric Specialty Exam: Review of Systems  Psychiatric/Behavioral: Negative.    All other systems reviewed and are negative.   Blood pressure (!) 175/68, pulse 71, temperature (!) 97.3 F (36.3 C), temperature source Skin, height 5\' 10"  (1.778 m), weight 260 lb 12.8 oz (118.3 kg).Body mass index is 37.42 kg/m.  General Appearance: Fairly Groomed  Eye Contact:  Good  Speech:  Clear and Coherent  Volume:  Normal  Mood:   good  Affect:  Appropriate, Congruent, and Full Range  Thought Process:  Coherent  Orientation:  Full (Time, Place, and Person)  Thought  Content: Logical   Suicidal Thoughts:  No  Homicidal Thoughts:  No  Memory:  Immediate;   Good  Judgement:  Good  Insight:  Good  Psychomotor Activity:  Normal  Concentration:  Concentration: Good and Attention Span: Good  Recall:  Good  Fund of Knowledge: Good  Language: Good  Akathisia:  No  Handed:  Right  AIMS (if indicated): not done  Assets:  Communication Skills Desire for Improvement  ADL's:  Intact  Cognition: WNL  Sleep:  Good   Screenings: AIMS    Flowsheet Row Admission (Discharged) from 02/16/2016 in [facility]  AIMS Total Score 0      AUDIT    Flowsheet Row Admission (Discharged) from 02/16/2016 in [facility]IC[facility]  Alcohol Use Disorder Identification Test Final Score (AUDIT) 0      GAD-7    Flowsheet Row Office Visit from 08/30/2022 in [facility] Office Visit from 06/14/2022 in [facility]  Total GAD-7 Score 2 1      PHQ2-9    Flowsheet Row Office Visit from 03/13/2023 in [facility] Office Visit from 08/30/2022 in [facility] Office Visit from 06/14/2022 in [facility] Video Visit from 12/09/2021 in [facility] Video Visit from 09/20/2021 in [facility]  PHQ-2 Total Score 0 2 0 1 1  PHQ-9 Total Score -- 3 -- -- --      Flowsheet Row Video Visit from 09/20/2021 in [facility] Office Visit from 05/24/2021 in [facility]  C-SSRS RISK CATEGORY No Risk Error: Q3, 4, or 5 should not be populated  when Q2 is No        Assessment and Plan:  Raliegh Karpf is a 77 y.o. year old male with a history of depression, hypertension, type II diabetes, sleep apnea  with BiPAP, who presents for follow up appointment for below.   1. MDD (major depressive disorder), recurrent, in partial remission (HCC) Acute stressors include: loss of his grandson's half brother  Other stressors include: aging, unemployment, financial strain, conflict with the mother of his grandchild (unable to see him), loss of his son from MVA at age 81     History:   He denies any significant mood symptoms despite the recent loss as above. He enjoys interaction with his most of his children, and reports good relationship with his wife.  Will continue current dose of Lexapro, bupropion to target depression. (Unable to consolidate to either of the medication due to risk of Qtc prolongation, hypertension)  2. Insomnia, unspecified type - uses BiPAP regularly. On zaleplon 10 mg since 04/2021, tapered down 03/2023 Improving despite lowering the dose of zaleplon.  He is willing to take it every other day for insomnia with the hope to discontinue this medication in the future.   3. Cognitive impairment Functional Status   IADL: Independent in the following:  medications, driving           Requires assistance with the following: managing finances (his wife handles it for many years) ADL  Independent in the following: bathing and hygiene, feeding, continence, grooming and toileting, walking          Requires assistance with the following: Folate, Vitamin B12, TSH 03/2023- wnl Images: not available  Neuropsych assessment: Clock drawing 1/3 (Draw a clock with the numbers 12, 10, 11, 3, 6, and "45". Add arrows pointing towards 10 and 11, positioned on the upper right corner of the clock.), oriented, delayed recall 3/3 03/2023 Etiology:   Unchanged. He has subjective complaints of memory loss, although he denies any issues with his functioning.  The exam reveals cognitive deficits in clock drawing, although it's uncertain whether they're related to the patient's education level (9th grade).  Will  continue to assess this.   Plan  Continue lexapro 10 mg daily Continue bupropion 150 mg daily  Decrease zaleplon 5 mg at night every other day for insomnia (he declined a refill) Next appointment- 9/17 at 11:30 for 30 mins, IP (his wife's phone- [phone])     The patient demonstrates the following risk factors for suicide: Chronic risk factors for suicide include: psychiatric disorder of depression . Acute risk factors for suicide include: N/A. Protective factors for this patient include: positive social support and hope for the future. Considering these factors, the overall suicide risk at this point appears to be low. Patient is appropriate for outpatient follow up.      The patient demonstrates the following risk factors for suicide: Chronic risk factors for suicide include: psychiatric disorder of depression . Acute risk factors for suicide include: N/A. Protective factors for this patient include: positive social support and hope for the future. Considering these factors, the overall suicide risk at this point appears to be low. Patient is appropriate for outpatient follow up.       Collaboration of Care: Collaboration of Care: Other reviewed notes in Epic  Patient/Guardian was advised Release of Information must be obtained prior to any record release in order to collaborate their care with an outside provider. Patient/Guardian was advised if they have not already done so to  contact the registration department to sign all necessary forms in order for [place] to release information regarding their care.   Consent: Patient/Guardian gives verbal consent for treatment and assignment of benefits for services provided during this visit. Patient/Guardian expressed understanding and agreed to proceed.    Neysa Hotter, MD 05/09/2023, 9:35 AM

## 2023-05-09 ENCOUNTER — Ambulatory Visit: Payer: PPO | Admitting: Psychiatry

## 2023-05-09 ENCOUNTER — Encounter: Payer: Self-pay | Admitting: Psychiatry

## 2023-05-09 VITALS — BP 175/68 | HR 71 | Temp 97.3°F | Ht 70.0 in | Wt 260.8 lb

## 2023-05-09 DIAGNOSIS — F3341 Major depressive disorder, recurrent, in partial remission: Secondary | ICD-10-CM | POA: Diagnosis not present

## 2023-05-09 DIAGNOSIS — G4700 Insomnia, unspecified: Secondary | ICD-10-CM | POA: Diagnosis not present

## 2023-05-09 DIAGNOSIS — R4189 Other symptoms and signs involving cognitive functions and awareness: Secondary | ICD-10-CM

## 2023-05-24 DIAGNOSIS — G4733 Obstructive sleep apnea (adult) (pediatric): Secondary | ICD-10-CM | POA: Diagnosis not present

## 2023-06-23 ENCOUNTER — Other Ambulatory Visit
Admission: RE | Admit: 2023-06-23 | Discharge: 2023-06-23 | Disposition: A | Discharge status: Home / Self Care | Payer: PPO | Source: Ambulatory Visit | Attending: Family Medicine | Admitting: Family Medicine

## 2023-06-23 DIAGNOSIS — R058 Other specified cough: Secondary | ICD-10-CM | POA: Diagnosis not present

## 2023-06-23 DIAGNOSIS — R0789 Other chest pain: Secondary | ICD-10-CM | POA: Insufficient documentation

## 2023-06-23 DIAGNOSIS — J40 Bronchitis, not specified as acute or chronic: Secondary | ICD-10-CM | POA: Diagnosis not present

## 2023-06-23 DIAGNOSIS — R059 Cough, unspecified: Secondary | ICD-10-CM | POA: Diagnosis not present

## 2023-06-23 DIAGNOSIS — R0602 Shortness of breath: Secondary | ICD-10-CM | POA: Insufficient documentation

## 2023-06-23 DIAGNOSIS — R918 Other nonspecific abnormal finding of lung field: Secondary | ICD-10-CM | POA: Diagnosis not present

## 2023-06-23 DIAGNOSIS — J984 Other disorders of lung: Secondary | ICD-10-CM | POA: Diagnosis not present

## 2023-06-23 LAB — BRAIN NATRIURETIC PEPTIDE: B Natriuretic Peptide: 60.2 pg/mL (ref 0.0–100.0)

## 2023-06-24 DIAGNOSIS — G4733 Obstructive sleep apnea (adult) (pediatric): Secondary | ICD-10-CM | POA: Diagnosis not present

## 2023-06-26 DIAGNOSIS — R0781 Pleurodynia: Secondary | ICD-10-CM | POA: Diagnosis not present

## 2023-06-26 DIAGNOSIS — J40 Bronchitis, not specified as acute or chronic: Secondary | ICD-10-CM | POA: Diagnosis not present

## 2023-06-28 DIAGNOSIS — R918 Other nonspecific abnormal finding of lung field: Secondary | ICD-10-CM | POA: Diagnosis not present

## 2023-06-28 DIAGNOSIS — R0781 Pleurodynia: Secondary | ICD-10-CM | POA: Diagnosis not present

## 2023-06-28 DIAGNOSIS — J984 Other disorders of lung: Secondary | ICD-10-CM | POA: Diagnosis not present

## 2023-06-28 DIAGNOSIS — J40 Bronchitis, not specified as acute or chronic: Secondary | ICD-10-CM | POA: Diagnosis not present

## 2023-07-03 ENCOUNTER — Inpatient Hospital Stay
Admission: EM | Admit: 2023-07-03 | Discharge: 2023-07-04 | DRG: 183 | Disposition: A | Discharge status: Other Acute Inpatient Hospital | Payer: PPO | Attending: Emergency Medicine | Admitting: Emergency Medicine

## 2023-07-03 ENCOUNTER — Encounter (HOSPITAL_COMMUNITY): Payer: Self-pay

## 2023-07-03 ENCOUNTER — Emergency Department: Payer: PPO

## 2023-07-03 ENCOUNTER — Other Ambulatory Visit: Payer: Self-pay

## 2023-07-03 DIAGNOSIS — R918 Other nonspecific abnormal finding of lung field: Secondary | ICD-10-CM | POA: Diagnosis not present

## 2023-07-03 DIAGNOSIS — S0121XA Laceration without foreign body of nose, initial encounter: Secondary | ICD-10-CM | POA: Diagnosis present

## 2023-07-03 DIAGNOSIS — R55 Syncope and collapse: Secondary | ICD-10-CM | POA: Diagnosis not present

## 2023-07-03 DIAGNOSIS — W1830XA Fall on same level, unspecified, initial encounter: Secondary | ICD-10-CM | POA: Diagnosis present

## 2023-07-03 DIAGNOSIS — Z1152 Encounter for screening for COVID-19: Secondary | ICD-10-CM | POA: Diagnosis not present

## 2023-07-03 DIAGNOSIS — S225XXA Flail chest, initial encounter for closed fracture: Secondary | ICD-10-CM | POA: Diagnosis present

## 2023-07-03 DIAGNOSIS — D72829 Elevated white blood cell count, unspecified: Secondary | ICD-10-CM | POA: Diagnosis not present

## 2023-07-03 DIAGNOSIS — S271XXA Traumatic hemothorax, initial encounter: Secondary | ICD-10-CM | POA: Diagnosis present

## 2023-07-03 DIAGNOSIS — S2242XA Multiple fractures of ribs, left side, initial encounter for closed fracture: Principal | ICD-10-CM | POA: Diagnosis present

## 2023-07-03 DIAGNOSIS — D62 Acute posthemorrhagic anemia: Secondary | ICD-10-CM | POA: Diagnosis present

## 2023-07-03 DIAGNOSIS — D649 Anemia, unspecified: Secondary | ICD-10-CM | POA: Diagnosis not present

## 2023-07-03 DIAGNOSIS — S27321A Contusion of lung, unilateral, initial encounter: Secondary | ICD-10-CM | POA: Diagnosis present

## 2023-07-03 DIAGNOSIS — R040 Epistaxis: Secondary | ICD-10-CM | POA: Diagnosis not present

## 2023-07-03 DIAGNOSIS — J942 Hemothorax: Secondary | ICD-10-CM

## 2023-07-03 DIAGNOSIS — R58 Hemorrhage, not elsewhere classified: Secondary | ICD-10-CM | POA: Diagnosis not present

## 2023-07-03 DIAGNOSIS — I700 Atherosclerosis of aorta: Secondary | ICD-10-CM | POA: Diagnosis not present

## 2023-07-03 DIAGNOSIS — A419 Sepsis, unspecified organism: Secondary | ICD-10-CM | POA: Diagnosis not present

## 2023-07-03 DIAGNOSIS — E872 Acidosis, unspecified: Secondary | ICD-10-CM | POA: Diagnosis not present

## 2023-07-03 DIAGNOSIS — R059 Cough, unspecified: Secondary | ICD-10-CM | POA: Diagnosis not present

## 2023-07-03 DIAGNOSIS — G930 Cerebral cysts: Secondary | ICD-10-CM | POA: Diagnosis not present

## 2023-07-03 DIAGNOSIS — S301XXA Contusion of abdominal wall, initial encounter: Secondary | ICD-10-CM | POA: Diagnosis not present

## 2023-07-03 DIAGNOSIS — R0989 Other specified symptoms and signs involving the circulatory and respiratory systems: Secondary | ICD-10-CM | POA: Diagnosis not present

## 2023-07-03 DIAGNOSIS — R739 Hyperglycemia, unspecified: Secondary | ICD-10-CM | POA: Diagnosis not present

## 2023-07-03 DIAGNOSIS — W19XXXA Unspecified fall, initial encounter: Secondary | ICD-10-CM | POA: Diagnosis not present

## 2023-07-03 DIAGNOSIS — S3991XA Unspecified injury of abdomen, initial encounter: Secondary | ICD-10-CM | POA: Diagnosis not present

## 2023-07-03 DIAGNOSIS — R42 Dizziness and giddiness: Secondary | ICD-10-CM | POA: Diagnosis not present

## 2023-07-03 DIAGNOSIS — S0990XA Unspecified injury of head, initial encounter: Secondary | ICD-10-CM | POA: Diagnosis not present

## 2023-07-03 LAB — URINALYSIS, W/ REFLEX TO CULTURE (INFECTION SUSPECTED)
Bacteria, UA: NONE SEEN
Bilirubin Urine: NEGATIVE
Glucose, UA: 50 mg/dL — AB
Hgb urine dipstick: NEGATIVE
Ketones, ur: NEGATIVE mg/dL
Leukocytes,Ua: NEGATIVE
Nitrite: NEGATIVE
Protein, ur: NEGATIVE mg/dL
Specific Gravity, Urine: 1.019 (ref 1.005–1.030)
Squamous Epithelial / HPF: NONE SEEN /HPF (ref 0–5)
pH: 5 (ref 5.0–8.0)

## 2023-07-03 LAB — TYPE AND SCREEN
ABO/RH(D): O NEG
Antibody Screen: NEGATIVE

## 2023-07-03 LAB — PROTIME-INR
INR: 1.1 (ref 0.8–1.2)
Prothrombin Time: 14.8 seconds (ref 11.4–15.2)

## 2023-07-03 LAB — CBC
HCT: 34.1 % — ABNORMAL LOW (ref 39.0–52.0)
Hemoglobin: 11.1 g/dL — ABNORMAL LOW (ref 13.0–17.0)
MCH: 29.5 pg (ref 26.0–34.0)
MCHC: 32.6 g/dL (ref 30.0–36.0)
MCV: 90.7 fL (ref 80.0–100.0)
Platelets: 375 10*3/uL (ref 150–400)
RBC: 3.76 MIL/uL — ABNORMAL LOW (ref 4.22–5.81)
RDW: 13.8 % (ref 11.5–15.5)
WBC: 19.6 10*3/uL — ABNORMAL HIGH (ref 4.0–10.5)
nRBC: 0 % (ref 0.0–0.2)

## 2023-07-03 LAB — CBC WITH DIFFERENTIAL/PLATELET
Abs Immature Granulocytes: 0.74 10*3/uL — ABNORMAL HIGH (ref 0.00–0.07)
Basophils Absolute: 0.1 10*3/uL (ref 0.0–0.1)
Basophils Relative: 1 %
Eosinophils Absolute: 0.3 10*3/uL (ref 0.0–0.5)
Eosinophils Relative: 1 %
HCT: 29.4 % — ABNORMAL LOW (ref 39.0–52.0)
Hemoglobin: 9.9 g/dL — ABNORMAL LOW (ref 13.0–17.0)
Immature Granulocytes: 4 %
Lymphocytes Relative: 12 %
Lymphs Abs: 2.2 10*3/uL (ref 0.7–4.0)
MCH: 30 pg (ref 26.0–34.0)
MCHC: 33.7 g/dL (ref 30.0–36.0)
MCV: 89.1 fL (ref 80.0–100.0)
Monocytes Absolute: 1.8 10*3/uL — ABNORMAL HIGH (ref 0.1–1.0)
Monocytes Relative: 10 %
Neutro Abs: 12.9 10*3/uL — ABNORMAL HIGH (ref 1.7–7.7)
Neutrophils Relative %: 72 %
Platelets: 349 10*3/uL (ref 150–400)
RBC: 3.3 MIL/uL — ABNORMAL LOW (ref 4.22–5.81)
RDW: 13.8 % (ref 11.5–15.5)
WBC: 18 10*3/uL — ABNORMAL HIGH (ref 4.0–10.5)
nRBC: 0 % (ref 0.0–0.2)

## 2023-07-03 LAB — COMPREHENSIVE METABOLIC PANEL
ALT: 28 U/L (ref 0–44)
AST: 20 U/L (ref 15–41)
Albumin: 2.6 g/dL — ABNORMAL LOW (ref 3.5–5.0)
Alkaline Phosphatase: 51 U/L (ref 38–126)
Anion gap: 10 (ref 5–15)
BUN: 21 mg/dL (ref 8–23)
CO2: 23 mmol/L (ref 22–32)
Calcium: 8 mg/dL — ABNORMAL LOW (ref 8.9–10.3)
Chloride: 98 mmol/L (ref 98–111)
Creatinine, Ser: 0.89 mg/dL (ref 0.61–1.24)
GFR, Estimated: >60 mL/min (ref >60)
Glucose, Bld: 259 mg/dL — ABNORMAL HIGH (ref 70–99)
Potassium: 3.9 mmol/L (ref 3.5–5.1)
Sodium: 131 mmol/L — ABNORMAL LOW (ref 135–145)
Total Bilirubin: 1.1 mg/dL (ref 0.3–1.2)
Total Protein: 5.3 g/dL — ABNORMAL LOW (ref 6.5–8.1)

## 2023-07-03 LAB — LACTIC ACID, PLASMA
Lactic Acid, Venous: 2.3 mmol/L (ref 0.5–1.9)
Lactic Acid, Venous: 2.4 mmol/L (ref 0.5–1.9)
Lactic Acid, Venous: 2.5 mmol/L (ref 0.5–1.9)

## 2023-07-03 LAB — RESP PANEL BY RT-PCR (RSV, FLU A&B, COVID)  RVPGX2
Influenza A by PCR: NEGATIVE
Influenza B by PCR: NEGATIVE
Resp Syncytial Virus by PCR: NEGATIVE
SARS Coronavirus 2 by RT PCR: NEGATIVE

## 2023-07-03 LAB — PROCALCITONIN: Procalcitonin: <0.1 ng/mL

## 2023-07-03 MED ORDER — ONDANSETRON HCL 4 MG/2ML IJ SOLN: 4.0000 mg | Freq: Four times a day (QID) | INTRAMUSCULAR | Status: DC | PRN

## 2023-07-03 MED ORDER — MORPHINE SULFATE (PF) 2 MG/ML IV SOLN
INTRAVENOUS | Status: AC
  Filled 2023-07-03: qty 1

## 2023-07-03 MED ORDER — ONDANSETRON 4 MG PO TBDP: 4.0000 mg | ORAL_TABLET | Freq: Four times a day (QID) | ORAL | Status: DC | PRN

## 2023-07-03 MED ORDER — METOPROLOL TARTRATE 5 MG/5ML IV SOLN: 5.0000 mg | Freq: Four times a day (QID) | INTRAVENOUS | Status: DC | PRN

## 2023-07-03 MED ORDER — KETOROLAC TROMETHAMINE 15 MG/ML IJ SOLN
15.0000 mg | Freq: Four times a day (QID) | INTRAMUSCULAR | Status: DC
  Administered 2023-07-03: 15 mg via INTRAVENOUS
  Filled 2023-07-03: qty 1

## 2023-07-03 MED ORDER — LACTATED RINGERS IV BOLUS
1000.0000 mL | Freq: Once | INTRAVENOUS | Status: AC
  Administered 2023-07-03: 1000 mL via INTRAVENOUS

## 2023-07-03 MED ORDER — VANCOMYCIN HCL IN DEXTROSE 1-5 GM/200ML-% IV SOLN
1000.0000 mg | Freq: Once | INTRAVENOUS | Status: AC
  Administered 2023-07-03: 1000 mg via INTRAVENOUS
  Filled 2023-07-03: qty 200

## 2023-07-03 MED ORDER — METHOCARBAMOL 500 MG PO TABS
750.0000 mg | ORAL_TABLET | Freq: Three times a day (TID) | ORAL | Status: DC
  Administered 2023-07-03: 750 mg via ORAL
  Filled 2023-07-03: qty 2

## 2023-07-03 MED ORDER — OXYCODONE HCL 5 MG PO TABS
2.5000 mg | ORAL_TABLET | ORAL | Status: DC | PRN
  Administered 2023-07-03: 5 mg via ORAL
  Filled 2023-07-03: qty 1

## 2023-07-03 MED ORDER — ACETAMINOPHEN 500 MG PO TABS
1000.0000 mg | ORAL_TABLET | Freq: Four times a day (QID) | ORAL | Status: DC
  Administered 2023-07-03: 1000 mg via ORAL
  Filled 2023-07-03: qty 2

## 2023-07-03 MED ORDER — SODIUM CHLORIDE 0.9 % IV SOLN
2.0000 g | Freq: Once | INTRAVENOUS | Status: AC
  Administered 2023-07-03: 2 g via INTRAVENOUS
  Filled 2023-07-03: qty 12.5

## 2023-07-03 MED ORDER — HYDROMORPHONE HCL 1 MG/ML IJ SOLN
1.0000 mg | Freq: Once | INTRAMUSCULAR | Status: AC
  Administered 2023-07-03: 1 mg via INTRAVENOUS
  Filled 2023-07-03: qty 1

## 2023-07-03 MED ORDER — METHOCARBAMOL 1000 MG/10ML IJ SOLN
750.0000 mg | Freq: Three times a day (TID) | INTRAVENOUS | Status: DC
  Filled 2023-07-03: qty 7.5

## 2023-07-03 MED ORDER — DOCUSATE SODIUM 100 MG PO CAPS
100.0000 mg | ORAL_CAPSULE | Freq: Two times a day (BID) | ORAL | Status: DC
  Administered 2023-07-03: 100 mg via ORAL
  Filled 2023-07-03: qty 1

## 2023-07-03 MED ORDER — MORPHINE SULFATE (PF) 2 MG/ML IV SOLN: 2.0000 mg | INTRAVENOUS | Status: DC | PRN

## 2023-07-03 MED ORDER — IOHEXOL 300 MG/ML  SOLN
100.0000 mL | Freq: Once | INTRAMUSCULAR | Status: AC | PRN
  Administered 2023-07-03: 100 mL via INTRAVENOUS

## 2023-07-03 MED ORDER — ENOXAPARIN SODIUM 30 MG/0.3ML IJ SOSY: 30.0000 mg | PREFILLED_SYRINGE | Freq: Two times a day (BID) | INTRAMUSCULAR | Status: DC

## 2023-07-03 MED ORDER — HYDRALAZINE HCL 20 MG/ML IJ SOLN: 10.0000 mg | INTRAMUSCULAR | Status: DC | PRN

## 2023-07-03 MED ORDER — MORPHINE SULFATE (PF) 2 MG/ML IV SOLN
2.0000 mg | Freq: Once | INTRAVENOUS | Status: AC
  Administered 2023-07-03: 2 mg via INTRAVENOUS

## 2023-07-03 MED ORDER — POLYETHYLENE GLYCOL 3350 17 G PO PACK: 17.0000 g | PACK | Freq: Every day | ORAL | Status: DC | PRN

## 2023-07-03 MED ORDER — SODIUM CHLORIDE 0.9 % IV BOLUS
1000.0000 mL | Freq: Once | INTRAVENOUS | Status: AC
  Administered 2023-07-03: 1000 mL via INTRAVENOUS

## 2023-07-03 MED ORDER — HYDROMORPHONE BOLUS VIA INFUSION: 1.0000 mg | Freq: Once | INTRAVENOUS | Status: DC

## 2023-07-03 MED ORDER — LIDOCAINE 5 % EX PTCH
2.0000 | MEDICATED_PATCH | CUTANEOUS | Status: DC
  Administered 2023-07-03: 2 via TRANSDERMAL
  Filled 2023-07-03: qty 2

## 2023-07-03 NOTE — ED Provider Notes (Signed)
[facility] Provider Note    Event Date/Time   First MD Initiated Contact with Patient 07/03/23 1132     (approximate)   History   Loss of Consciousness   HPI  Mario Knapp is a 77 y.o. male   who presents to the emergency department today after a syncopal episode.  The patient states that he did not have any chest pain or palpitations prior to passing out.  He was standing up when it happened.  Fell down onto his face.  Did suffer a nosebleed and is having some pain around his nose.  In addition the patient states that for a couple of weeks he has been dealing with bronchitis type symptoms.  Primarily cough and pain to his left chest wall.  About a week ago he says he fell.  Has had some bruising around his abdomen since the fall.      Physical Exam   Triage Vital Signs: ED Triage Vitals  Encounter Vitals Group     BP 07/03/23 1129 95/74     Systolic BP Percentile --      Diastolic BP Percentile --      Pulse Rate 07/03/23 1129 82     Resp 07/03/23 1129 18     Temp 07/03/23 1129 100.1 F (37.8 C)     Temp Source 07/03/23 1129 Oral     SpO2 07/03/23 1129 93 %     Weight 07/03/23 1135 264 lb (119.7 kg)     Height 07/03/23 1135 5\' 10"  (1.778 m)     Head Circumference --      Peak Flow --      Pain Score 07/03/23 1134 5     Pain Loc --      Pain Education --      Exclude from Growth Chart --     Most recent vital signs: Vitals:   07/03/23 1145 07/03/23 1200  BP: 105/76 96/75  Pulse: 81 78  Resp: (!) 26 (!) 25  Temp:    SpO2: 95% 95%   General: Awake, alert, oriented. CV:  Good peripheral perfusion. Regular rate and rhythm. Resp:  Normal effort. Tachycardia. Abd:  No distention.  Skin:  Bruising noted to left flank and abdomen. Well approximated laceration to bridge of nose Rectal:  Brown stool on glove. GUIAC negative.  ED Results / Procedures / Treatments   Labs (all labs ordered are listed, but only abnormal results are  displayed) Labs Reviewed  COMPREHENSIVE METABOLIC PANEL - Abnormal; Notable for the following components:      Result Value   Sodium 131 (*)    Glucose, Bld 259 (*)    Calcium 8.0 (*)    Total Protein 5.3 (*)    Albumin 2.6 (*)    All other components within normal limits  LACTIC ACID, PLASMA - Abnormal; Notable for the following components:   Lactic Acid, Venous 2.3 (*)    All other components within normal limits  CBC WITH DIFFERENTIAL/PLATELET - Abnormal; Notable for the following components:   WBC 18.0 (*)    RBC 3.30 (*)    Hemoglobin 9.9 (*)    HCT 29.4 (*)    Neutro Abs 12.9 (*)    Monocytes Absolute 1.8 (*)    Abs Immature Granulocytes 0.74 (*)    All other components within normal limits  URINALYSIS, W/ REFLEX TO CULTURE (INFECTION SUSPECTED) - Abnormal; Notable for the following components:   Color, Urine YELLOW (*)  APPearance CLEAR (*)    Glucose, UA 50 (*)    All other components within normal limits  CULTURE, BLOOD (ROUTINE X 2)  CULTURE, BLOOD (ROUTINE X 2)  PROTIME-INR  LACTIC ACID, PLASMA  TYPE AND SCREEN     EKG  I, Phineas Semen, attending physician, personally viewed and interpreted this EKG  EKG Time: 1132 Rate: 78 Rhythm: sinus rhythm Axis: normal Intervals: qtc 449 QRS: IVCD, atypical RBBB ST changes: no st elevation Impression: abnormal ekg   RADIOLOGY I independently interpreted and visualized the CXR. My interpretation: No pneumonia Radiology interpretation:  IMPRESSION:  Low lung volumes.     PROCEDURES:  Critical Care performed: Yes  CRITICAL CARE Performed by: Phineas Semen   Total critical care time: 30 minutes  Critical care time was exclusive of separately billable procedures and treating other patients.  Critical care was necessary to treat or prevent imminent or life-threatening deterioration.  Critical care was time spent personally by me on the following activities: development of treatment plan with  patient and/or surrogate as well as nursing, discussions with consultants, evaluation of patient's response to treatment, examination of patient, obtaining history from patient or surrogate, ordering and performing treatments and interventions, ordering and review of laboratory studies, ordering and review of radiographic studies, pulse oximetry and re-evaluation of patient's condition.   Procedures    MEDICATIONS ORDERED IN ED: Medications - No data to display   IMPRESSION / MDM / ASSESSMENT AND PLAN / ED COURSE  I reviewed the triage vital signs and the nursing notes.                              Differential diagnosis includes, but is not limited to, UTI, pneumonia, rib fracture, nasal fracture, ICH  Patient's presentation is most consistent with acute presentation with potential threat to life or bodily function.   The patient is on the cardiac monitor to evaluate for evidence of arrhythmia and/or significant heart rate changes.  Patient presented to the emergency department today because of concerns for a syncopal episode today.  On exam patient has evidence of nasal trauma with small laceration to bridge of nose and blood in ears.  Additionally patient was found to have temperature of 100.1 orally upon arrival.  Blood work was ordered and was notable for significant leukocytosis and slight lactic acidosis.  Because of this broad-spectrum antibiotics were ordered.  Additionally patient was found to be quite anemic compared to most recent blood work found in care everywhere.  Guaiac was negative.  I have low suspicion he lost this volume of blood through epistaxis.  Given bruising in his abdomen would consider this as the possible etiology of the anemia.  Will obtain CT abdomen pelvis of his.  Additionally will get CT of the head cervical spine and max face given trauma today.  Do think patient will require admission.   FINAL CLINICAL IMPRESSION(S) / ED DIAGNOSES   Final diagnoses:   Syncope, unspecified syncope type  Anemia, unspecified type  Leukocytosis, unspecified type  Lactic acidosis  Epistaxis        Note:  This document was prepared using Dragon voice recognition software and may include unintentional dictation errors.    Phineas Semen, MD 07/04/23 [phone]

## 2023-07-03 NOTE — ED Triage Notes (Addendum)
Pt. To ED for syncopal episode at home. Pt. States he went to the bathroom, was walking out and became lightheaded and fell face-first onto hardwood floor. Pt. States he came around and called 911. Pt. States cough and congestion x several days. Pt. States he took his BP meds this AM. Denies blood thinners. Pt. Alert and oriented in triage.

## 2023-07-03 NOTE — ED Provider Notes (Signed)
Procedures  ----------------------------------------- 5:22 PM on 07/03/2023 ----------------------------------------- Patient complains of increasing pain. CT scans reviewed.  CT abdomen pelvis interpreted by me showing left lower chest wall rib fractures with displacement and herniation of abdominal cavity through the chest wall.  There is also extensive soft tissue hematoma.  Radiology report reviewed  CT head, face, C-spine negative for acute injuries.  Blood pressure 150/80, heart rate 105.  Overall nontoxic.  Will need to transfer to trauma hospital.  Clinical Course as of 07/05/23 0002  Mon Jul 03, 2023  1745 D/w [facility] Dr. Bedelia Person - accepted for admission. CT chest requested [PS]    Clinical Course User Index [PS] Sharman Cheek, MD   Final diagnoses:  Syncope, unspecified syncope type  Leukocytosis, unspecified type  Lactic acidosis  Epistaxis  Closed fracture of multiple ribs of left side, initial encounter  Hemothorax on left  Acute blood loss anemia          Sharman Cheek, MD 07/05/23 0002

## 2023-07-03 NOTE — H&P (Incomplete)
Reason for Consult/Chief Complaint: flail chest Consultant: Scotty Court, MD  Mario Knapp is an 77 y.o. male.   HPI: ***  Past Medical History:  Diagnosis Date   Arthritis    Depression    Hypertension    Sleep apnea     Past Surgical History:  Procedure Laterality Date   BALLOON DILATION N/A 05/11/2016   Procedure: BALLOON DILATION;  Surgeon: Scot Jun, MD;  Location: [facility] ENDOSCOPY;  Service: Endoscopy;  Laterality: N/A;   CHOLECYSTECTOMY     ESOPHAGOGASTRODUODENOSCOPY N/A 04/15/2016   Procedure: ESOPHAGOGASTRODUODENOSCOPY (EGD) with removal of foreign bodu;  Surgeon: Midge Minium, MD;  Location: [facility] ENDOSCOPY;  Service: Endoscopy;  Laterality: N/A;   ESOPHAGOGASTRODUODENOSCOPY (EGD) WITH PROPOFOL N/A 05/11/2016   Procedure: ESOPHAGOGASTRODUODENOSCOPY (EGD) WITH PROPOFOL;  Surgeon: Scot Jun, MD;  Location: [facility] ENDOSCOPY;  Service: Endoscopy;  Laterality: N/A;    Family History  Problem Relation Age of Onset   Depression Mother    Thyroid disease Mother    Alcohol abuse Father    Emphysema Father    Breast cancer Sister        two times   Depression Sister    Depression Sister    Alcohol abuse Brother    Alcohol abuse Paternal Aunt     Social History:  reports that he quit smoking about 42 years ago. His smoking use included cigarettes. He quit smokeless tobacco use about 42 years ago. He reports that he does not drink alcohol and does not use drugs.  Allergies: No Known Allergies  Medications: I have reviewed the patient's current medications.  Results for orders placed or performed during the hospital encounter of 07/03/23 (from the past 48 hour(s))  Urinalysis, w/ Reflex to Culture (Infection Suspected) -Urine, Clean Catch     Status: Abnormal   Collection Time: 07/03/23 11:39 AM  Result Value Ref Range   Specimen Source URINE, CATHETERIZED    Color, Urine YELLOW (A) YELLOW   APPearance CLEAR (A) CLEAR   Specific Gravity, Urine 1.019 1.005 -  1.030   pH 5.0 5.0 - 8.0   Glucose, UA 50 (A) NEGATIVE mg/dL   Hgb urine dipstick NEGATIVE NEGATIVE   Bilirubin Urine NEGATIVE NEGATIVE   Ketones, ur NEGATIVE NEGATIVE mg/dL   Protein, ur NEGATIVE NEGATIVE mg/dL   Nitrite NEGATIVE NEGATIVE   Leukocytes,Ua NEGATIVE NEGATIVE   RBC / HPF 0-5 0 - 5 RBC/hpf   WBC, UA 0-5 0 - 5 WBC/hpf    Comment:        Reflex urine culture not performed if WBC <=10, OR if Squamous epithelial cells >5. If Squamous epithelial cells >5 suggest recollection.    Bacteria, UA NONE SEEN NONE SEEN   Squamous Epithelial / HPF NONE SEEN 0 - 5 /HPF   Mucus PRESENT    Hyaline Casts, UA PRESENT     Comment: Performed at [facility], [address] [place], [place] [address]  Procalcitonin     Status: None   Collection Time: 07/03/23 11:39 AM  Result Value Ref Range   Procalcitonin <0.10 ng/mL    Comment:        Interpretation: PCT (Procalcitonin) <= 0.5 ng/mL: Systemic infection (sepsis) is not likely. Local bacterial infection is possible. (NOTE)       Sepsis PCT Algorithm           Lower Respiratory Tract  Infection PCT Algorithm    ----------------------------     ----------------------------         PCT < 0.25 ng/mL                PCT < 0.10 ng/mL          Strongly encourage             Strongly discourage   discontinuation of antibiotics    initiation of antibiotics    ----------------------------     -----------------------------       PCT 0.25 - 0.50 ng/mL            PCT 0.10 - 0.25 ng/mL               OR       >80% decrease in PCT            Discourage initiation of                                            antibiotics      Encourage discontinuation           of antibiotics    ----------------------------     -----------------------------         PCT >= 0.50 ng/mL              PCT 0.26 - 0.50 ng/mL               AND        <80% decrease in PCT             Encourage initiation of                                              antibiotics       Encourage continuation           of antibiotics    ----------------------------     -----------------------------        PCT >= 0.50 ng/mL                  PCT > 0.50 ng/mL               AND         increase in PCT                  Strongly encourage                                      initiation of antibiotics    Strongly encourage escalation           of antibiotics                                     -----------------------------                                           PCT <= 0.25 ng/mL  OR                                        > 80% decrease in PCT                                      Discontinue / Do not initiate                                             antibiotics  Performed at [facility], [address] [place], [place] [address]   Comprehensive metabolic panel     Status: Abnormal   Collection Time: 07/03/23 11:50 AM  Result Value Ref Range   Sodium 131 (L) 135 - 145 mmol/L   Potassium 3.9 3.5 - 5.1 mmol/L   Chloride 98 98 - 111 mmol/L   CO2 23 22 - 32 mmol/L   Glucose, Bld 259 (H) 70 - 99 mg/dL    Comment: Glucose reference range applies only to samples taken after fasting for at least 8 hours.   BUN 21 8 - 23 mg/dL   Creatinine, Ser 6.04 0.61 - 1.24 mg/dL   Calcium 8.0 (L) 8.9 - 10.3 mg/dL   Total Protein 5.3 (L) 6.5 - 8.1 g/dL   Albumin 2.6 (L) 3.5 - 5.0 g/dL   AST 20 15 - 41 U/L   ALT 28 0 - 44 U/L   Alkaline Phosphatase 51 38 - 126 U/L   Total Bilirubin 1.1 0.3 - 1.2 mg/dL   GFR, Estimated >54 >09 mL/min    Comment: (NOTE) Calculated using the CKD-EPI Creatinine Equation (2021)    Anion gap 10 5 - 15    Comment: Performed at [facility], [address] [place], [place] [address]  Lactic acid, plasma     Status: Abnormal   Collection Time: 07/03/23 11:50 AM  Result Value Ref Range   Lactic Acid, Venous 2.3 (HH) 0.5 - 1.9 mmol/L     Comment: CRITICAL RESULT CALLED TO, READ BACK BY AND VERIFIED WITH SYDNEY SHAW 07/03/23 1242 MU Performed at [facility], [address] [place], [place] [address]   CBC with Differential     Status: Abnormal   Collection Time: 07/03/23 11:50 AM  Result Value Ref Range   WBC 18.0 (H) 4.0 - 10.5 K/uL   RBC 3.30 (L) 4.22 - 5.81 MIL/uL   Hemoglobin 9.9 (L) 13.0 - 17.0 g/dL   HCT 56.2 (L) 13.0 - 86.5 %   MCV 89.1 80.0 - 100.0 fL   MCH 30.0 26.0 - 34.0 pg   MCHC 33.7 30.0 - 36.0 g/dL   RDW 78.4 69.6 - 29.5 %   Platelets 349 150 - 400 K/uL   nRBC 0.0 0.0 - 0.2 %   Neutrophils Relative % 72 %   Neutro Abs 12.9 (H) 1.7 - 7.7 K/uL   Lymphocytes Relative 12 %   Lymphs Abs 2.2 0.7 - 4.0 K/uL   Monocytes Relative 10 %   Monocytes Absolute 1.8 (H) 0.1 - 1.0 K/uL   Eosinophils Relative 1 %   Eosinophils Absolute 0.3 0.0 - 0.5 K/uL   Basophils Relative 1 %   Basophils Absolute 0.1 0.0 -  0.1 K/uL   Immature Granulocytes 4 %   Abs Immature Granulocytes 0.74 (H) 0.00 - 0.07 K/uL    Comment: Performed at [facility], [address] [place], [place] [address]  Protime-INR     Status: None   Collection Time: 07/03/23 11:50 AM  Result Value Ref Range   Prothrombin Time 14.8 11.4 - 15.2 seconds   INR 1.1 0.8 - 1.2    Comment: (NOTE) INR goal varies based on device and disease states. Performed at [facility], [address] [place], [place] [address]   Lactic acid, plasma     Status: Abnormal   Collection Time: 07/03/23  1:39 PM  Result Value Ref Range   Lactic Acid, Venous 2.4 (HH) 0.5 - 1.9 mmol/L    Comment: CRITICAL VALUE NOTED. VALUE IS CONSISTENT WITH PREVIOUSLY REPORTED/CALLED VALUE MU Performed at [facility], [address] [place], [place] [address]   Type and screen [facility]     Status: None   Collection Time: 07/03/23  3:07 PM  Result Value Ref Range   ABO/RH(D) O NEG    Antibody Screen NEG    Sample  Expiration      07/06/2023,2359 Performed at [facility], [address] [place], [place] [address]   Lactic acid, plasma     Status: Abnormal   Collection Time: 07/03/23  5:31 PM  Result Value Ref Range   Lactic Acid, Venous 2.5 (HH) 0.5 - 1.9 mmol/L    Comment: CRITICAL VALUE NOTED. VALUE IS CONSISTENT WITH PREVIOUSLY REPORTED/CALLED VALUE SKL Performed at [facility], [address] [place], [place] [address]   CBC     Status: Abnormal   Collection Time: 07/03/23  5:31 PM  Result Value Ref Range   WBC 19.6 (H) 4.0 - 10.5 K/uL   RBC 3.76 (L) 4.22 - 5.81 MIL/uL   Hemoglobin 11.1 (L) 13.0 - 17.0 g/dL   HCT 72.5 (L) 36.6 - 44.0 %   MCV 90.7 80.0 - 100.0 fL   MCH 29.5 26.0 - 34.0 pg   MCHC 32.6 30.0 - 36.0 g/dL   RDW 34.7 42.5 - 95.6 %   Platelets 375 150 - 400 K/uL   nRBC 0.0 0.0 - 0.2 %    Comment: Performed at [facility], [address] [place], [place] [address]  Resp panel by RT-PCR (RSV, Flu A&B, Covid) Anterior Nasal Swab     Status: None   Collection Time: 07/03/23  5:49 PM   Specimen: Anterior Nasal Swab  Result Value Ref Range   SARS Coronavirus 2 by RT PCR NEGATIVE NEGATIVE    Comment: (NOTE) SARS-CoV-2 target nucleic acids are NOT DETECTED.  The SARS-CoV-2 RNA is generally detectable in upper respiratory specimens during the acute phase of infection. The lowest concentration of SARS-CoV-2 viral copies this assay can detect is 138 copies/mL. A negative result does not preclude SARS-Cov-2 infection and should not be used as the sole basis for treatment or other patient management decisions. A negative result may occur with  improper specimen collection/handling, submission of specimen other than nasopharyngeal swab, presence of viral mutation(s) within the areas targeted by this assay, and inadequate number of viral copies(<138 copies/mL). A negative result must be combined with clinical observations, patient history, and  epidemiological information. The expected result is Negative.  Fact Sheet for Patients:  BloggerCourse.com  Fact Sheet for Healthcare Providers:  SeriousBroker.it  This test is no t yet approved or cleared  by the [place] and  has been authorized for detection and/or diagnosis of SARS-CoV-2 by FDA under an Emergency Use Authorization (EUA). This EUA will remain  in effect (meaning this test can be used) for the duration of the COVID-19 declaration under Section 564(b)(1) of the Act, 21 U.S.C.section 360bbb-3(b)(1), unless the authorization is terminated  or revoked sooner.       Influenza A by PCR NEGATIVE NEGATIVE   Influenza B by PCR NEGATIVE NEGATIVE    Comment: (NOTE) The Xpert Xpress SARS-CoV-2/FLU/RSV plus assay is intended as an aid in the diagnosis of influenza from Nasopharyngeal swab specimens and should not be used as a sole basis for treatment. Nasal washings and aspirates are unacceptable for Xpert Xpress SARS-CoV-2/FLU/RSV testing.  Fact Sheet for Patients: BloggerCourse.com  Fact Sheet for Healthcare Providers: SeriousBroker.it  This test is not yet approved or cleared by the [place] FDA and has been authorized for detection and/or diagnosis of SARS-CoV-2 by FDA under an Emergency Use Authorization (EUA). This EUA will remain in effect (meaning this test can be used) for the duration of the COVID-19 declaration under Section 564(b)(1) of the Act, 21 U.S.C. section 360bbb-3(b)(1), unless the authorization is terminated or revoked.     Resp Syncytial Virus by PCR NEGATIVE NEGATIVE    Comment: (NOTE) Fact Sheet for Patients: BloggerCourse.com  Fact Sheet for Healthcare Providers: SeriousBroker.it  This test is not yet approved or cleared by the [place] FDA and has been authorized for  detection and/or diagnosis of SARS-CoV-2 by FDA under an Emergency Use Authorization (EUA). This EUA will remain in effect (meaning this test can be used) for the duration of the COVID-19 declaration under Section 564(b)(1) of the Act, 21 U.S.C. section 360bbb-3(b)(1), unless the authorization is terminated or revoked.  Performed at [facility], [address], [place], [place] [address]     CT Chest Wo Contrast  Result Date: 07/03/2023 CLINICAL DATA:  Blunt chest trauma. EXAM: CT CHEST WITHOUT CONTRAST TECHNIQUE: Multidetector CT imaging of the chest was performed following the standard protocol without IV contrast. RADIATION DOSE REDUCTION: This exam was performed according to the departmental dose-optimization program which includes automated exposure control, adjustment of the mA and/or kV according to patient size and/or use of iterative reconstruction technique. COMPARISON:  Chest radiograph 07/03/2023.  CT chest 11/02/2017 FINDINGS: Cardiovascular: Normal heart size. No pericardial effusions. Calcification of the aorta and coronary arteries. No aneurysm. Mediastinum/Nodes: Thyroid gland is unremarkable. Esophagus is decompressed. No significant lymphadenopathy. Lungs/Pleura: Small left pleural effusion with basilar atelectasis or consolidation. Right lung is clear. No pneumothoraces. Upper Abdomen: Surgical absence of the gallbladder. Cyst in the right kidney measuring 7.1 cm diameter. No change since prior study. No imaging follow-up is indicated. Musculoskeletal: Multiple acute left rib fractures involving the posterior left sixth, seventh, eighth, ninth, tenth, and eleventh ribs with severe displacement of the seventh, eighth, ninth, and tenth ribs. Left chest wall stranding and hematoma adjacent to the rib fractures with focal collection/hematoma measuring about 4 x 9.7 cm. IMPRESSION: 1. Multiple acute left rib fractures, some with severe displacement. 2. Left pleural effusion  with consolidation or atelectasis in the left lung base, possibly pulmonary contusion. No pneumothorax. Associated left chest wall hematomas. 3. Aortic atherosclerosis. Electronically Signed   By: Burman Nieves M.D.   On: 07/03/2023 18:48   CT ABDOMEN PELVIS W CONTRAST  Result Date: 07/03/2023 CLINICAL DATA:  Lightheadedness, syncope, fell, abdominal pain EXAM: CT ABDOMEN AND PELVIS WITH CONTRAST TECHNIQUE:  Multidetector CT imaging of the abdomen and pelvis was performed using the standard protocol following bolus administration of intravenous contrast. RADIATION DOSE REDUCTION: This exam was performed according to the departmental dose-optimization program which includes automated exposure control, adjustment of the mA and/or kV according to patient size and/or use of iterative reconstruction technique. CONTRAST:  OMNIPAQUE IOHEXOL 300 MG/ML  SOLN COMPARISON:  07/01/2019. 06/28/2023 report only, images unavailable. FINDINGS: Lower chest: There is a small left hemothorax, with blood products dependent within the left posterior costophrenic angle. There are multiple displaced there are displaced left posterior seventh, eighth, and ninth rib fractures. The left ninth rib fracture is likely segmental, with dissociation of the left rib anteriorly from the costochondral junction. This results and left lower lateral thoracic hernia containing portions of the left long and left upper quadrant abdominal contents. Diffuse subcutaneous edema within the left posterolateral chest wall and left upper abdominal wall consistent with contusion and direct trauma. Hepatobiliary: Prior cholecystectomy. No acute liver injury. No biliary duct dilation. Pancreas: Unremarkable. No pancreatic ductal dilatation or surrounding inflammatory changes. Spleen: No splenic injury or perisplenic hematoma. Adrenals/Urinary Tract: No evidence of acute renal injury. Multiple bilateral simple appearing renal cysts do not require specific  imaging follow-up. 0.9 cm exophytic hyperdensity off the ventral aspect left kidney image 48/2 measures 73 HU, indeterminate. Punctate 2 mm nonobstructing calculi upper pole left kidney. No right-sided calculi. No obstructive uropathy within either kidney. The adrenals and bladder are unremarkable. Stomach/Bowel: No bowel obstruction or ileus. Moderate stool throughout the colon consistent with constipation. No bowel wall thickening or inflammatory change. Vascular/Lymphatic: Aortic atherosclerosis. No enlarged abdominal or pelvic lymph nodes. Reproductive: Prostate is enlarged measuring 5.4 x 4.7 cm. Other: No free intraperitoneal fluid or free gas. Herniation of the left lower chest and left upper quadrant abdomen through the left thoracic cage defect created by the segmental left ninth rib fracture as described above. No other abdominal wall hernia. Musculoskeletal: Left posterior seventh, eighth, and ninth rib fractures as above. The ninth rib fracture is segmental, with dissociation of the left ninth costochondral junction. There is herniation of the left lower lobe and left upper quadrant abdominal contents through the defect created by the segmental left ninth rib fracture. Overlying subcutaneous edema and subcutaneous hematoma within the left lateral chest wall and left upper quadrant abdominal wall consistent with direct trauma. No other acute displaced fractures. Reconstructed images demonstrate no additional findings. IMPRESSION: 1. Left seventh through ninth rib fractures as above, with segmental ninth rib fracture creating a left lower thoracic cage defect with subsequent herniation of the left lower lobe and left upper quadrant abdominal contents through the defect as described above. 2. Extensive left chest wall and left upper quadrant abdominal wall subcutaneous fat stranding and subcutaneous hematoma consistent with direct trauma and contusion. 3. Small left hemothorax.  No evidence of pneumothorax.  4. Punctate nonobstructing left renal calculi. 5. Indeterminate subcentimeter hyperdense lesion ventral aspect left kidney, too small to characterize. This could reflect a hyperdense cyst, and follow-up nonemergent ultrasound may be useful to exclude solid lesion. Other bilateral simple renal cysts do not require specific imaging follow-up. 6.  Aortic Atherosclerosis (ICD10-I70.0). 7. Enlarged prostate. Electronically Signed   By: Sharlet Salina M.D.   On: 07/03/2023 16:54   CT Head Wo Contrast  Result Date: 07/03/2023 CLINICAL DATA:  Fall with head/face strike. EXAM: CT HEAD WITHOUT CONTRAST CT MAXILLOFACIAL WITHOUT CONTRAST CT CERVICAL SPINE WITHOUT CONTRAST TECHNIQUE: Multidetector CT imaging of the head, cervical  spine, and maxillofacial structures were performed using the standard protocol without intravenous contrast. Multiplanar CT image reconstructions of the cervical spine and maxillofacial structures were also generated. RADIATION DOSE REDUCTION: This exam was performed according to the departmental dose-optimization program which includes automated exposure control, adjustment of the mA and/or kV according to patient size and/or use of iterative reconstruction technique. COMPARISON:  None Available. FINDINGS: CT HEAD FINDINGS Brain: No acute intracranial hemorrhage. Gray-white differentiation is preserved. No hydrocephalus or midline shift. Left middle cranial fossa arachnoid cyst with mild mass effect on the left anterior temporal lobe. Vascular: No hyperdense vessel or unexpected calcification. Skull: No calvarial fracture or suspicious bone lesion. Skull base is unremarkable. Other: None. CT MAXILLOFACIAL FINDINGS Osseous: No fracture or mandibular dislocation. No destructive process. Orbits: Negative. No traumatic or inflammatory finding. Sinuses: Clear. Soft tissues: Unremarkable. CT CERVICAL SPINE FINDINGS Alignment: Normal. Skull base and vertebrae: No acute fracture. Normal craniocervical  junction. No suspicious bone lesions. Soft tissues and spinal canal: No prevertebral fluid or swelling. No visible canal hematoma. Disc levels: Mild cervical spondylosis without high-grade spinal canal stenosis. Upper chest: No acute findings. Other: None. IMPRESSION: 1. No acute intracranial abnormality. 2. No acute facial bone fracture. 3. No acute fracture or traumatic listhesis of the cervical spine. 4. Left middle cranial fossa arachnoid cyst with mild mass effect on the left anterior temporal lobe. Electronically Signed   By: Orvan Falconer M.D.   On: 07/03/2023 16:48   CT Maxillofacial W Contrast  Result Date: 07/03/2023 CLINICAL DATA:  Fall with head/face strike. EXAM: CT HEAD WITHOUT CONTRAST CT MAXILLOFACIAL WITHOUT CONTRAST CT CERVICAL SPINE WITHOUT CONTRAST TECHNIQUE: Multidetector CT imaging of the head, cervical spine, and maxillofacial structures were performed using the standard protocol without intravenous contrast. Multiplanar CT image reconstructions of the cervical spine and maxillofacial structures were also generated. RADIATION DOSE REDUCTION: This exam was performed according to the departmental dose-optimization program which includes automated exposure control, adjustment of the mA and/or kV according to patient size and/or use of iterative reconstruction technique. COMPARISON:  None Available. FINDINGS: CT HEAD FINDINGS Brain: No acute intracranial hemorrhage. Gray-white differentiation is preserved. No hydrocephalus or midline shift. Left middle cranial fossa arachnoid cyst with mild mass effect on the left anterior temporal lobe. Vascular: No hyperdense vessel or unexpected calcification. Skull: No calvarial fracture or suspicious bone lesion. Skull base is unremarkable. Other: None. CT MAXILLOFACIAL FINDINGS Osseous: No fracture or mandibular dislocation. No destructive process. Orbits: Negative. No traumatic or inflammatory finding. Sinuses: Clear. Soft tissues: Unremarkable. CT  CERVICAL SPINE FINDINGS Alignment: Normal. Skull base and vertebrae: No acute fracture. Normal craniocervical junction. No suspicious bone lesions. Soft tissues and spinal canal: No prevertebral fluid or swelling. No visible canal hematoma. Disc levels: Mild cervical spondylosis without high-grade spinal canal stenosis. Upper chest: No acute findings. Other: None. IMPRESSION: 1. No acute intracranial abnormality. 2. No acute facial bone fracture. 3. No acute fracture or traumatic listhesis of the cervical spine. 4. Left middle cranial fossa arachnoid cyst with mild mass effect on the left anterior temporal lobe. Electronically Signed   By: Orvan Falconer M.D.   On: 07/03/2023 16:48   CT Cervical Spine Wo Contrast  Result Date: 07/03/2023 CLINICAL DATA:  Fall with head/face strike. EXAM: CT HEAD WITHOUT CONTRAST CT MAXILLOFACIAL WITHOUT CONTRAST CT CERVICAL SPINE WITHOUT CONTRAST TECHNIQUE: Multidetector CT imaging of the head, cervical spine, and maxillofacial structures were performed using the standard protocol without intravenous contrast. Multiplanar CT image reconstructions of the cervical spine  and maxillofacial structures were also generated. RADIATION DOSE REDUCTION: This exam was performed according to the departmental dose-optimization program which includes automated exposure control, adjustment of the mA and/or kV according to patient size and/or use of iterative reconstruction technique. COMPARISON:  None Available. FINDINGS: CT HEAD FINDINGS Brain: No acute intracranial hemorrhage. Gray-white differentiation is preserved. No hydrocephalus or midline shift. Left middle cranial fossa arachnoid cyst with mild mass effect on the left anterior temporal lobe. Vascular: No hyperdense vessel or unexpected calcification. Skull: No calvarial fracture or suspicious bone lesion. Skull base is unremarkable. Other: None. CT MAXILLOFACIAL FINDINGS Osseous: No fracture or mandibular dislocation. No destructive  process. Orbits: Negative. No traumatic or inflammatory finding. Sinuses: Clear. Soft tissues: Unremarkable. CT CERVICAL SPINE FINDINGS Alignment: Normal. Skull base and vertebrae: No acute fracture. Normal craniocervical junction. No suspicious bone lesions. Soft tissues and spinal canal: No prevertebral fluid or swelling. No visible canal hematoma. Disc levels: Mild cervical spondylosis without high-grade spinal canal stenosis. Upper chest: No acute findings. Other: None. IMPRESSION: 1. No acute intracranial abnormality. 2. No acute facial bone fracture. 3. No acute fracture or traumatic listhesis of the cervical spine. 4. Left middle cranial fossa arachnoid cyst with mild mass effect on the left anterior temporal lobe. Electronically Signed   By: Orvan Falconer M.D.   On: 07/03/2023 16:48   DG Chest Port 1 View  Result Date: 07/03/2023 CLINICAL DATA:  Sepsis, cough and congestion. EXAM: PORTABLE CHEST 1 VIEW COMPARISON:  04/15/2016 FINDINGS: The heart size and mediastinal contours are within normal limits. Lung volumes are very low bilaterally. There is no evidence of pulmonary edema, consolidation, pneumothorax or pleural fluid. The visualized skeletal structures are unremarkable. IMPRESSION: Low lung volumes. Electronically Signed   By: Irish Lack M.D.   On: 07/03/2023 13:08    ROS 10 point review of systems is negative except as listed above in HPI.   Physical Exam Blood pressure (!) 144/88, pulse (!) 123, temperature 97.8 F (36.6 C), temperature source Oral, resp. rate (!) 26, height 5\' 10"  (1.778 m), weight 119.7 kg, SpO2 97%. Constitutional: well-developed, well-nourished*** HEENT: pupils equal, round, reactive to light, 2***mm b/l, moist conjunctiva, external inspection of ears and nose normal, hearing {Blank single:19197::"intact","diminished","unable to be assessed"} Oropharynx: normal oropharyngeal mucosa, {Blank single:19197::"normal","poor"} dentition Neck: no thyromegaly, trachea  midline, {Blank single:19197::"+","no","unable to assess"} midline cervical tenderness to palpation Chest: breath sounds equal bilaterally, {Blank single:19197::"normal","absent","labored"} respiratory effort, {Blank single:19197::"+","no"} midline or lateral chest wall tenderness to palpation/deformity Abdomen: soft, NT, no bruising, no hepatosplenomegaly GU: {Blank single:19197::"no blood at urethral meatus of penis, no scrotal masses or abnormality","normal male genitalia"}  Back: no wounds, {Blank single:19197::"+","no","unable to assess"} thoracic/lumbar spine tenderness to palpation, {Blank single:19197::"+","no"} thoracic/lumbar spine stepoffs Rectal: {Blank single:19197::"good tone, no blood","deferred"} Extremities: 2+ radial and pedal pulses bilaterally, {Blank single:19197::"intact","unable to assess"} motor and sensation bilateral UE and LE, {Blank single:19197::"+","no"} peripheral edema MSK: {Blank single:19197::"normal","abnormal","unable to assess"} gait/station, no clubbing/cyanosis of fingers/toes, {Blank single:19197::"normal","limited","unable to assess"} ROM of all four extremities Skin: warm, dry, no rashes Psych: {Blank single:19197::"normal memory, normal mood/affect","unable to assess"}     Assessment/Plan: ***  *** -  FEN - {diet:29922} DVT - SCDs, {Blank single:19197::"LMWH","LMWH 40BID","SQH","hold chemical ppx due to bleeding concerns"} Dispo - {Blank single:19197::"ICU","4NP","med-surg","***"}    Diamantina Monks, MD General and Trauma Surgery [facility] Surgery

## 2023-07-03 NOTE — ED Notes (Signed)
PT  GOING  TO  [facility]  WAITING  ON  BED

## 2023-07-03 NOTE — ED Notes (Signed)
This

## 2023-07-03 NOTE — ED Notes (Signed)
Pt in CT.

## 2023-07-03 NOTE — ED Notes (Signed)
Date and time results received: 07/03/23 1245  Test: Lactic Acid Critical Value: 2.3  Name of Provider Notified: Phineas Semen, MD

## 2023-07-03 NOTE — ED Notes (Signed)
Pt. To CT

## 2023-07-03 NOTE — ED Notes (Signed)
Pts daughters to bedside.

## 2023-07-03 NOTE — ED Notes (Signed)
This RN to bedside for alarm ringing. Pt. Standing at bedside, urinating with urinal. Pt. Has been told several times by this RN that he is not to stand or get out of bed r/t his syncopal episode earlier. Pt. Chuckles and states he cannot urinate sitting on the edge of the bed. Pt's HR while standing is 125. This RN explained that this increases pt's risk for dizziness/syncope. Pt. Verbalizes understanding and states he will fcall for assistance when he needs to urinate.

## 2023-07-03 NOTE — ED Notes (Signed)
[facility]  CALLED  PER  DR  Joni Fears MD

## 2023-07-03 NOTE — ED Notes (Signed)
Date and time results received: 07/03/23 1812  Test: Lactic Acid Critical Value: 2.5  Name of Provider Notified: Scotty Court, MD

## 2023-07-04 ENCOUNTER — Inpatient Hospital Stay (HOSPITAL_COMMUNITY): Payer: PPO

## 2023-07-04 ENCOUNTER — Inpatient Hospital Stay (HOSPITAL_COMMUNITY)
Admission: EM | Admit: 2023-07-04 | Discharge: 2023-08-08 | DRG: 208 | Disposition: E | Payer: PPO | Source: Other Acute Inpatient Hospital | Attending: Surgery | Admitting: Surgery

## 2023-07-04 ENCOUNTER — Encounter (HOSPITAL_COMMUNITY): Payer: PPO

## 2023-07-04 DIAGNOSIS — Z818 Family history of other mental and behavioral disorders: Secondary | ICD-10-CM

## 2023-07-04 DIAGNOSIS — F32A Depression, unspecified: Secondary | ICD-10-CM | POA: Diagnosis not present

## 2023-07-04 DIAGNOSIS — E785 Hyperlipidemia, unspecified: Secondary | ICD-10-CM | POA: Diagnosis present

## 2023-07-04 DIAGNOSIS — G4700 Insomnia, unspecified: Secondary | ICD-10-CM | POA: Diagnosis not present

## 2023-07-04 DIAGNOSIS — Z4682 Encounter for fitting and adjustment of non-vascular catheter: Secondary | ICD-10-CM | POA: Diagnosis not present

## 2023-07-04 DIAGNOSIS — Z7984 Long term (current) use of oral hypoglycemic drugs: Secondary | ICD-10-CM

## 2023-07-04 DIAGNOSIS — S2242XA Multiple fractures of ribs, left side, initial encounter for closed fracture: Secondary | ICD-10-CM | POA: Diagnosis not present

## 2023-07-04 DIAGNOSIS — I700 Atherosclerosis of aorta: Secondary | ICD-10-CM | POA: Diagnosis not present

## 2023-07-04 DIAGNOSIS — I10 Essential (primary) hypertension: Secondary | ICD-10-CM | POA: Diagnosis present

## 2023-07-04 DIAGNOSIS — E871 Hypo-osmolality and hyponatremia: Secondary | ICD-10-CM | POA: Diagnosis present

## 2023-07-04 DIAGNOSIS — I959 Hypotension, unspecified: Secondary | ICD-10-CM | POA: Diagnosis present

## 2023-07-04 DIAGNOSIS — G4733 Obstructive sleep apnea (adult) (pediatric): Secondary | ICD-10-CM | POA: Diagnosis present

## 2023-07-04 DIAGNOSIS — G930 Cerebral cysts: Secondary | ICD-10-CM | POA: Diagnosis present

## 2023-07-04 DIAGNOSIS — Z8349 Family history of other endocrine, nutritional and metabolic diseases: Secondary | ICD-10-CM | POA: Diagnosis not present

## 2023-07-04 DIAGNOSIS — I4510 Unspecified right bundle-branch block: Secondary | ICD-10-CM | POA: Diagnosis present

## 2023-07-04 DIAGNOSIS — S225XXA Flail chest, initial encounter for closed fracture: Principal | ICD-10-CM | POA: Diagnosis present

## 2023-07-04 DIAGNOSIS — F5101 Primary insomnia: Secondary | ICD-10-CM | POA: Diagnosis not present

## 2023-07-04 DIAGNOSIS — Y92009 Unspecified place in unspecified non-institutional (private) residence as the place of occurrence of the external cause: Secondary | ICD-10-CM

## 2023-07-04 DIAGNOSIS — R296 Repeated falls: Secondary | ICD-10-CM | POA: Diagnosis present

## 2023-07-04 DIAGNOSIS — R55 Syncope and collapse: Secondary | ICD-10-CM | POA: Diagnosis present

## 2023-07-04 DIAGNOSIS — J90 Pleural effusion, not elsewhere classified: Secondary | ICD-10-CM | POA: Diagnosis present

## 2023-07-04 DIAGNOSIS — Z9049 Acquired absence of other specified parts of digestive tract: Secondary | ICD-10-CM

## 2023-07-04 DIAGNOSIS — J9601 Acute respiratory failure with hypoxia: Secondary | ICD-10-CM | POA: Diagnosis present

## 2023-07-04 DIAGNOSIS — R2681 Unsteadiness on feet: Secondary | ICD-10-CM | POA: Diagnosis present

## 2023-07-04 DIAGNOSIS — E875 Hyperkalemia: Secondary | ICD-10-CM | POA: Diagnosis not present

## 2023-07-04 DIAGNOSIS — D62 Acute posthemorrhagic anemia: Secondary | ICD-10-CM | POA: Diagnosis not present

## 2023-07-04 DIAGNOSIS — Z6839 Body mass index (BMI) 39.0-39.9, adult: Secondary | ICD-10-CM

## 2023-07-04 DIAGNOSIS — J9690 Respiratory failure, unspecified, unspecified whether with hypoxia or hypercapnia: Secondary | ICD-10-CM | POA: Diagnosis not present

## 2023-07-04 DIAGNOSIS — F419 Anxiety disorder, unspecified: Secondary | ICD-10-CM | POA: Diagnosis present

## 2023-07-04 DIAGNOSIS — I469 Cardiac arrest, cause unspecified: Secondary | ICD-10-CM | POA: Diagnosis not present

## 2023-07-04 DIAGNOSIS — Z811 Family history of alcohol abuse and dependence: Secondary | ICD-10-CM

## 2023-07-04 DIAGNOSIS — Z87891 Personal history of nicotine dependence: Secondary | ICD-10-CM

## 2023-07-04 DIAGNOSIS — K6389 Other specified diseases of intestine: Secondary | ICD-10-CM | POA: Diagnosis not present

## 2023-07-04 DIAGNOSIS — S20212A Contusion of left front wall of thorax, initial encounter: Secondary | ICD-10-CM | POA: Diagnosis present

## 2023-07-04 DIAGNOSIS — J9811 Atelectasis: Secondary | ICD-10-CM | POA: Diagnosis not present

## 2023-07-04 DIAGNOSIS — R0602 Shortness of breath: Secondary | ICD-10-CM | POA: Diagnosis not present

## 2023-07-04 DIAGNOSIS — S2249XA Multiple fractures of ribs, unspecified side, initial encounter for closed fracture: Secondary | ICD-10-CM | POA: Diagnosis present

## 2023-07-04 DIAGNOSIS — R410 Disorientation, unspecified: Secondary | ICD-10-CM | POA: Diagnosis present

## 2023-07-04 DIAGNOSIS — R9389 Abnormal findings on diagnostic imaging of other specified body structures: Secondary | ICD-10-CM | POA: Diagnosis not present

## 2023-07-04 DIAGNOSIS — Z9181 History of falling: Secondary | ICD-10-CM

## 2023-07-04 DIAGNOSIS — Z452 Encounter for adjustment and management of vascular access device: Secondary | ICD-10-CM | POA: Diagnosis not present

## 2023-07-04 DIAGNOSIS — Z825 Family history of asthma and other chronic lower respiratory diseases: Secondary | ICD-10-CM

## 2023-07-04 DIAGNOSIS — Z803 Family history of malignant neoplasm of breast: Secondary | ICD-10-CM

## 2023-07-04 DIAGNOSIS — I517 Cardiomegaly: Secondary | ICD-10-CM | POA: Diagnosis not present

## 2023-07-04 DIAGNOSIS — Z79899 Other long term (current) drug therapy: Secondary | ICD-10-CM

## 2023-07-04 DIAGNOSIS — R0600 Dyspnea, unspecified: Secondary | ICD-10-CM | POA: Diagnosis not present

## 2023-07-04 DIAGNOSIS — S271XXA Traumatic hemothorax, initial encounter: Secondary | ICD-10-CM | POA: Diagnosis present

## 2023-07-04 DIAGNOSIS — K567 Ileus, unspecified: Secondary | ICD-10-CM | POA: Diagnosis not present

## 2023-07-04 DIAGNOSIS — R918 Other nonspecific abnormal finding of lung field: Secondary | ICD-10-CM | POA: Diagnosis not present

## 2023-07-04 DIAGNOSIS — W06XXXA Fall from bed, initial encounter: Secondary | ICD-10-CM | POA: Diagnosis present

## 2023-07-04 DIAGNOSIS — R609 Edema, unspecified: Secondary | ICD-10-CM | POA: Diagnosis present

## 2023-07-04 DIAGNOSIS — E1165 Type 2 diabetes mellitus with hyperglycemia: Secondary | ICD-10-CM | POA: Diagnosis not present

## 2023-07-04 DIAGNOSIS — G8918 Other acute postprocedural pain: Secondary | ICD-10-CM | POA: Diagnosis not present

## 2023-07-04 LAB — BASIC METABOLIC PANEL
Anion gap: 8 (ref 5–15)
BUN: 15 mg/dL (ref 8–23)
CO2: 24 mmol/L (ref 22–32)
Calcium: 8.4 mg/dL — ABNORMAL LOW (ref 8.9–10.3)
Chloride: 101 mmol/L (ref 98–111)
Creatinine, Ser: 1.03 mg/dL (ref 0.61–1.24)
GFR, Estimated: >60 mL/min (ref >60)
Glucose, Bld: 305 mg/dL — ABNORMAL HIGH (ref 70–99)
Potassium: 4 mmol/L (ref 3.5–5.1)
Sodium: 133 mmol/L — ABNORMAL LOW (ref 135–145)

## 2023-07-04 LAB — ECHOCARDIOGRAM COMPLETE BUBBLE STUDY
AR max vel: 4.03 cm2
AV Area VTI: 4.08 cm2
AV Area mean vel: 4.21 cm2
AV Mean grad: 4 mmHg
AV Peak grad: 8.4 mmHg
Ao pk vel: 1.45 m/s
Area-P 1/2: 4.21 cm2
P 1/2 time: 321 msec
S' Lateral: 2.3 cm

## 2023-07-04 LAB — GLUCOSE, CAPILLARY
Glucose-Capillary: 207 mg/dL — ABNORMAL HIGH (ref 70–99)
Glucose-Capillary: 270 mg/dL — ABNORMAL HIGH (ref 70–99)
Glucose-Capillary: 289 mg/dL — ABNORMAL HIGH (ref 70–99)
Glucose-Capillary: 293 mg/dL — ABNORMAL HIGH (ref 70–99)

## 2023-07-04 LAB — CBC
HCT: 31.3 % — ABNORMAL LOW (ref 39.0–52.0)
Hemoglobin: 10.2 g/dL — ABNORMAL LOW (ref 13.0–17.0)
MCH: 29.4 pg (ref 26.0–34.0)
MCHC: 32.6 g/dL (ref 30.0–36.0)
MCV: 90.2 fL (ref 80.0–100.0)
Platelets: 394 10*3/uL (ref 150–400)
RBC: 3.47 MIL/uL — ABNORMAL LOW (ref 4.22–5.81)
RDW: 14 % (ref 11.5–15.5)
WBC: 18.7 10*3/uL — ABNORMAL HIGH (ref 4.0–10.5)
nRBC: 0 % (ref 0.0–0.2)

## 2023-07-04 LAB — MRSA NEXT GEN BY PCR, NASAL: MRSA by PCR Next Gen: NOT DETECTED

## 2023-07-04 MED ORDER — MORPHINE SULFATE (PF) 2 MG/ML IV SOLN: 2.0000 mg | INTRAVENOUS | Status: DC | PRN

## 2023-07-04 MED ORDER — KETOROLAC TROMETHAMINE 15 MG/ML IJ SOLN
15.0000 mg | Freq: Four times a day (QID) | INTRAMUSCULAR | Status: AC
  Administered 2023-07-04 – 2023-07-09 (×20): 15 mg via INTRAVENOUS
  Filled 2023-07-04 (×20): qty 1

## 2023-07-04 MED ORDER — ORAL CARE MOUTH RINSE
15.0000 mL | OROMUCOSAL | Status: DC
  Administered 2023-07-04 – 2023-07-14 (×35): 15 mL via OROMUCOSAL

## 2023-07-04 MED ORDER — PERFLUTREN LIPID MICROSPHERE
1.0000 mL | INTRAVENOUS | Status: AC | PRN
  Administered 2023-07-04: 2 mL via INTRAVENOUS

## 2023-07-04 MED ORDER — METOPROLOL TARTRATE 5 MG/5ML IV SOLN: 5.0000 mg | Freq: Four times a day (QID) | INTRAVENOUS | Status: DC | PRN

## 2023-07-04 MED ORDER — CHLORHEXIDINE GLUCONATE CLOTH 2 % EX PADS
6.0000 | MEDICATED_PAD | Freq: Every day | CUTANEOUS | Status: DC
  Administered 2023-07-04 – 2023-07-14 (×12): 6 via TOPICAL

## 2023-07-04 MED ORDER — INSULIN ASPART 100 UNIT/ML IJ SOLN
0.0000 [IU] | INTRAMUSCULAR | Status: DC
  Administered 2023-07-04 (×3): 11 [IU] via SUBCUTANEOUS
  Administered 2023-07-04: 7 [IU] via SUBCUTANEOUS
  Administered 2023-07-05: 4 [IU] via SUBCUTANEOUS
  Administered 2023-07-05: 11 [IU] via SUBCUTANEOUS
  Administered 2023-07-05: 4 [IU] via SUBCUTANEOUS
  Administered 2023-07-05: 7 [IU] via SUBCUTANEOUS
  Administered 2023-07-05: 15 [IU] via SUBCUTANEOUS
  Administered 2023-07-06: 4 [IU] via SUBCUTANEOUS
  Administered 2023-07-06 (×3): 7 [IU] via SUBCUTANEOUS
  Administered 2023-07-06: 15 [IU] via SUBCUTANEOUS
  Administered 2023-07-06: 11 [IU] via SUBCUTANEOUS
  Administered 2023-07-06: 4 [IU] via SUBCUTANEOUS
  Administered 2023-07-07: 7 [IU] via SUBCUTANEOUS
  Administered 2023-07-07 (×2): 4 [IU] via SUBCUTANEOUS
  Administered 2023-07-07: 7 [IU] via SUBCUTANEOUS
  Administered 2023-07-07: 11 [IU] via SUBCUTANEOUS
  Administered 2023-07-08 (×3): 4 [IU] via SUBCUTANEOUS
  Administered 2023-07-08: 7 [IU] via SUBCUTANEOUS
  Administered 2023-07-08 – 2023-07-09 (×2): 4 [IU] via SUBCUTANEOUS
  Administered 2023-07-09 (×2): 3 [IU] via SUBCUTANEOUS
  Administered 2023-07-09: 4 [IU] via SUBCUTANEOUS
  Administered 2023-07-09: 3 [IU] via SUBCUTANEOUS
  Administered 2023-07-09: 4 [IU] via SUBCUTANEOUS
  Administered 2023-07-10 (×3): 3 [IU] via SUBCUTANEOUS
  Administered 2023-07-10 (×2): 4 [IU] via SUBCUTANEOUS
  Administered 2023-07-11 (×3): 3 [IU] via SUBCUTANEOUS
  Administered 2023-07-11: 4 [IU] via SUBCUTANEOUS
  Administered 2023-07-11: 3 [IU] via SUBCUTANEOUS
  Administered 2023-07-12: 4 [IU] via SUBCUTANEOUS
  Administered 2023-07-12: 7 [IU] via SUBCUTANEOUS
  Administered 2023-07-12 – 2023-07-13 (×8): 4 [IU] via SUBCUTANEOUS
  Administered 2023-07-13 – 2023-07-14 (×4): 7 [IU] via SUBCUTANEOUS

## 2023-07-04 MED ORDER — BUPROPION HCL ER (XL) 150 MG PO TB24
150.0000 mg | ORAL_TABLET | Freq: Every day | ORAL | Status: DC
  Administered 2023-07-04 – 2023-07-15 (×11): 150 mg via ORAL
  Filled 2023-07-04 (×12): qty 1

## 2023-07-04 MED ORDER — LIDOCAINE 5 % EX PTCH: 2.0000 | MEDICATED_PATCH | CUTANEOUS | Status: DC

## 2023-07-04 MED ORDER — ACETAMINOPHEN 500 MG PO TABS
1000.0000 mg | ORAL_TABLET | Freq: Four times a day (QID) | ORAL | Status: DC | PRN
  Administered 2023-07-04 – 2023-07-13 (×6): 1000 mg via ORAL
  Filled 2023-07-04 (×8): qty 2

## 2023-07-04 MED ORDER — ENOXAPARIN SODIUM 30 MG/0.3ML IJ SOSY: 30.0000 mg | PREFILLED_SYRINGE | Freq: Two times a day (BID) | INTRAMUSCULAR | Status: DC

## 2023-07-04 MED ORDER — ONDANSETRON HCL 4 MG/2ML IJ SOLN
4.0000 mg | Freq: Four times a day (QID) | INTRAMUSCULAR | Status: DC | PRN
  Administered 2023-07-12: 4 mg via INTRAVENOUS
  Filled 2023-07-04: qty 2

## 2023-07-04 MED ORDER — HYDRALAZINE HCL 20 MG/ML IJ SOLN
10.0000 mg | INTRAMUSCULAR | Status: DC | PRN
  Administered 2023-07-04 – 2023-07-07 (×2): 10 mg via INTRAVENOUS
  Filled 2023-07-04 (×2): qty 1

## 2023-07-04 MED ORDER — ACETAMINOPHEN 500 MG PO TABS: 1000.0000 mg | ORAL_TABLET | Freq: Four times a day (QID) | ORAL | Status: DC

## 2023-07-04 MED ORDER — POLYETHYLENE GLYCOL 3350 17 G PO PACK
17.0000 g | PACK | Freq: Every day | ORAL | Status: DC | PRN
  Administered 2023-07-04: 17 g via ORAL
  Filled 2023-07-04: qty 1

## 2023-07-04 MED ORDER — DOCUSATE SODIUM 100 MG PO CAPS
100.0000 mg | ORAL_CAPSULE | Freq: Two times a day (BID) | ORAL | Status: DC
  Administered 2023-07-04 – 2023-07-15 (×18): 100 mg via ORAL
  Filled 2023-07-04 (×18): qty 1

## 2023-07-04 MED ORDER — OXYCODONE HCL 5 MG PO TABS: 2.5000 mg | ORAL_TABLET | ORAL | Status: DC | PRN

## 2023-07-04 MED ORDER — METHOCARBAMOL 500 MG PO TABS: 750.0000 mg | ORAL_TABLET | Freq: Three times a day (TID) | ORAL | Status: DC | PRN

## 2023-07-04 MED ORDER — HYDRALAZINE HCL 20 MG/ML IJ SOLN: 10.0000 mg | INTRAMUSCULAR | Status: DC | PRN

## 2023-07-04 MED ORDER — ENOXAPARIN SODIUM 30 MG/0.3ML IJ SOSY
30.0000 mg | PREFILLED_SYRINGE | Freq: Two times a day (BID) | INTRAMUSCULAR | Status: DC
  Administered 2023-07-04: 30 mg via SUBCUTANEOUS
  Filled 2023-07-04: qty 0.3

## 2023-07-04 MED ORDER — METHOCARBAMOL 1000 MG/10ML IJ SOLN: 750.0000 mg | Freq: Three times a day (TID) | INTRAVENOUS | Status: DC

## 2023-07-04 MED ORDER — ESCITALOPRAM OXALATE 10 MG PO TABS
10.0000 mg | ORAL_TABLET | Freq: Every day | ORAL | Status: DC
  Administered 2023-07-04 – 2023-07-15 (×11): 10 mg via ORAL
  Filled 2023-07-04 (×11): qty 1

## 2023-07-04 MED ORDER — OXYCODONE HCL 5 MG PO TABS
2.5000 mg | ORAL_TABLET | ORAL | Status: DC | PRN
  Administered 2023-07-04 – 2023-07-05 (×5): 5 mg via ORAL
  Filled 2023-07-04 (×6): qty 1

## 2023-07-04 MED ORDER — POLYETHYLENE GLYCOL 3350 17 G PO PACK: 17.0000 g | PACK | Freq: Every day | ORAL | Status: DC | PRN

## 2023-07-04 MED ORDER — ONDANSETRON HCL 4 MG/2ML IJ SOLN: 4.0000 mg | Freq: Four times a day (QID) | INTRAMUSCULAR | Status: DC | PRN

## 2023-07-04 MED ORDER — ONDANSETRON 4 MG PO TBDP: 4.0000 mg | ORAL_TABLET | Freq: Four times a day (QID) | ORAL | Status: DC | PRN

## 2023-07-04 MED ORDER — KETOROLAC TROMETHAMINE 15 MG/ML IJ SOLN: 15.0000 mg | Freq: Four times a day (QID) | INTRAMUSCULAR | Status: DC

## 2023-07-04 MED ORDER — ORAL CARE MOUTH RINSE
15.0000 mL | OROMUCOSAL | Status: DC | PRN
  Administered 2023-07-13: 15 mL via OROMUCOSAL

## 2023-07-04 MED ORDER — METHOCARBAMOL 1000 MG/10ML IJ SOLN
750.0000 mg | Freq: Three times a day (TID) | INTRAVENOUS | Status: DC
  Administered 2023-07-04 – 2023-07-05 (×4): 750 mg via INTRAVENOUS
  Filled 2023-07-04 (×7): qty 7.5

## 2023-07-04 MED ORDER — DOCUSATE SODIUM 100 MG PO CAPS: 100.0000 mg | ORAL_CAPSULE | Freq: Two times a day (BID) | ORAL | Status: DC

## 2023-07-04 MED ORDER — MORPHINE SULFATE (PF) 2 MG/ML IV SOLN
2.0000 mg | INTRAVENOUS | Status: DC | PRN
  Administered 2023-07-04 – 2023-07-07 (×11): 2 mg via INTRAVENOUS
  Filled 2023-07-04 (×11): qty 1

## 2023-07-04 MED ORDER — METHOCARBAMOL 500 MG PO TABS: 750.0000 mg | ORAL_TABLET | Freq: Three times a day (TID) | ORAL | Status: DC

## 2023-07-04 NOTE — ED Notes (Signed)
Transport here to pick up pt.

## 2023-07-04 NOTE — Evaluation (Signed)
Physical Therapy Evaluation Patient Details Name: Mario Knapp MRN: [id number] DOB: [birth year] Today's Date: 07/04/2023  History of Present Illness  Pt is 77 yo male who presents on 07/03/23 after a syncopal episode at home. Pt with 2 week hx of L sided chest pain, on abx from urgent care. Found with L fib fx 6-11 with flail segment. PMH includes: arthritis, depression, HTN, sleep apnea.  Clinical Impression  Pt admitted with above diagnosis. Pt from home with wife and daughter, was independent driving and working in yard. Has had 2 recent falls, one OOB and one yesterday onto nose. Pt mobilizing at contact guard A level. SPO2 dropped to 89% on RA and DOE reached 3/4. Pt remained on 4L O2 to ambulate and sats remained in low 90's with 2/4 DOE. RW used for energy conservation, may need for home pending progress. HR stable throughout but BP 177/81 after session. Will follow acutely but do not anticipate him needing PT at d/c.  Pt currently with functional limitations due to the deficits listed below (see PT Problem List). Pt will benefit from acute skilled PT to increase their independence and safety with mobility to allow discharge.           If plan is discharge home, recommend the following: Help with stairs or ramp for entrance;Assist for transportation;Assistance with cooking/housework   Can travel by private vehicle        Equipment Recommendations Other (comment) (TBD, RW if needed for energy conservation)  Recommendations for Other Services       Functional Status Assessment Patient has had a recent decline in their functional status and demonstrates the ability to make significant improvements in function in a reasonable and predictable amount of time.     Precautions / Restrictions Precautions Precautions: Fall Precaution Comments: watch o2 Restrictions Weight Bearing Restrictions: No      Mobility  Bed Mobility               General bed mobility comments: OOB  upon entry    Transfers Overall transfer level: Needs assistance Equipment used: Rolling walker (2 wheels) Transfers: Sit to/from Stand Sit to Stand: Contact guard assist           General transfer comment: cueing for safety, hand placement. cueing for controlled descent to sitting    Ambulation/Gait Ambulation/Gait assistance: Min assist Gait Distance (Feet): 50 Feet Assistive device: Rolling walker (2 wheels) Gait Pattern/deviations: Step-through pattern Gait velocity: decreased Gait velocity interpretation: <1.8 ft/sec, indicate of risk for recurrent falls   General Gait Details: pt with increased RR to 44 and 2/4 DOE with ambulation. Used RW for energy conservation, SPO2 remained low 90's on 4L O2  Stairs            Wheelchair Mobility     Tilt Bed    Modified Rankin (Stroke Patients Only)       Balance Overall balance assessment: Mild deficits observed, not formally tested                                           Pertinent Vitals/Pain Pain Assessment Pain Assessment: 0-10 Pain Score: 5  Pain Location: L side Pain Descriptors / Indicators: Dull Pain Intervention(s): Limited activity within patient's tolerance, Monitored during session    Home Living Family/patient expects to be discharged to:: Private residence Living Arrangements: Spouse/significant other Available Help at Discharge: Family  Type of Home: House Home Access: Stairs to enter Entrance Stairs-Rails: Can reach both Entrance Stairs-Number of Steps: 2   Home Layout: One level Home Equipment: None Additional Comments: CPAP    Prior Function Prior Level of Function : Independent/Modified Independent;Driving;History of Falls (last six months)               ADLs Comments: ADLs/IADLs     Extremity/Trunk Assessment   Upper Extremity Assessment Upper Extremity Assessment: Defer to OT evaluation    Lower Extremity Assessment Lower Extremity Assessment:  Overall WFL for tasks assessed    Cervical / Trunk Assessment Cervical / Trunk Assessment: Other exceptions Cervical / Trunk Exceptions: rib fx L  Communication   Communication Communication: No apparent difficulties  Cognition Arousal: Alert Behavior During Therapy: WFL for tasks assessed/performed Overall Cognitive Status: Within Functional Limits for tasks assessed                                          General Comments General comments (skin integrity, edema, etc.): SPO2 dropped to 89% on RA prior to ambulation so O2 kept on for ambulation. Reviewed use of IS and flutter valve. HR 110's throughout, BP 177/81 end of session. Family present    Exercises     Assessment/Plan    PT Assessment Patient needs continued PT services  PT Problem List Decreased activity tolerance;Decreased balance;Decreased mobility;Pain;Cardiopulmonary status limiting activity;Decreased knowledge of use of DME       PT Treatment Interventions DME instruction;Gait training;Stair training;Functional mobility training;Therapeutic activities;Therapeutic exercise;Balance training;Patient/family education    PT Goals (Current goals can be found in the Care Plan section)  Acute Rehab PT Goals Patient Stated Goal: return home ASAP PT Goal Formulation: With patient Time For Goal Achievement: 07/18/23 Potential to Achieve Goals: Good    Frequency Min 1X/week     Co-evaluation               AM-PAC PT "6 Clicks" Mobility  Outcome Measure Help needed turning from your back to your side while in a flat bed without using bedrails?: None Help needed moving from lying on your back to sitting on the side of a flat bed without using bedrails?: None Help needed moving to and from a bed to a chair (including a wheelchair)?: A Little Help needed standing up from a chair using your arms (e.g., wheelchair or bedside chair)?: A Little Help needed to walk in hospital room?: A Little Help  needed climbing 3-5 steps with a railing? : A Little 6 Click Score: 20    End of Session Equipment Utilized During Treatment: Gait belt;Oxygen Activity Tolerance: Patient tolerated treatment well Patient left: in chair;with call bell/phone within reach;with family/visitor present Nurse Communication: Mobility status PT Visit Diagnosis: Unsteadiness on feet (R26.81);Difficulty in walking, not elsewhere classified (R26.2);Pain Pain - Right/Left: Left Pain - part of body:  (chest)    Time: 1610-9604 PT Time Calculation (min) (ACUTE ONLY): 22 min   Charges:   PT Evaluation $PT Eval Moderate Complexity: 1 Mod   PT General Charges $$ ACUTE PT VISIT: 1 Visit         Lyanne Co, PT  Acute Rehab Services Secure chat preferred Office [phone]   Lawana Chambers Fallynn Gravett 07/04/2023, 2:46 PM

## 2023-07-04 NOTE — Evaluation (Signed)
Occupational Therapy Evaluation Patient Details Name: Mario Knapp MRN: [id number] DOB: Nov 02, 1946 Today's Date: 07/04/2023   History of Present Illness Pt is 77 yo male who presents on 07/03/23 after a syncopal episode at home. Pt with 2 week hx of L sided chest pain, on abx from urgent care. Found with L fib fx 6-11 with flail segment. PMH includes: arthritis, depression, HTN, sleep apnea.   Clinical Impression   PTA patient independent and driving. Admitted for above and presents with problem list below. Currently completing transfers with min guard assist, functional mobility using RW with min guard for safety and ADLs with up to min assist.  He is on 4L O2 during session with Spo2 >92% but RR at times up to 44 (cueing for PLB), trial on RA at rest with Spo2 desaturating to 89% and pt with DOE of 3/4, HR stable but BP increased to 177/81 at end of session. Based on performance today, anticipate pt will progress well acutely with no needs required after dc.  Will follow.       If plan is discharge home, recommend the following: A little help with walking and/or transfers;A little help with bathing/dressing/bathroom;Assist for transportation;Assistance with cooking/housework    Functional Status Assessment  Patient has had a recent decline in their functional status and demonstrates the ability to make significant improvements in function in a reasonable and predictable amount of time.  Equipment Recommendations  BSC/3in1    Recommendations for Other Services       Precautions / Restrictions Precautions Precautions: Fall Precaution Comments: watch o2 Restrictions Weight Bearing Restrictions: No      Mobility Bed Mobility               General bed mobility comments: OOB upon entry    Transfers Overall transfer level: Needs assistance Equipment used: Rolling walker (2 wheels) Transfers: Sit to/from Stand Sit to Stand: Contact guard assist           General  transfer comment: cueing for safety, hand placement      Balance Overall balance assessment: Mild deficits observed, not formally tested                                         ADL either performed or assessed with clinical judgement   ADL Overall ADL's : Needs assistance/impaired     Grooming: Contact guard assist;Standing           Upper Body Dressing : Set up;Sitting   Lower Body Dressing: Minimal assistance;Sit to/from stand Lower Body Dressing Details (indicate cue type and reason): for socks Toilet Transfer: Contact guard assist;Ambulation           Functional mobility during ADLs: Contact guard assist;Rolling walker (2 wheels);Cueing for safety       Vision   Vision Assessment?: No apparent visual deficits     Perception         Praxis         Pertinent Vitals/Pain Pain Assessment Pain Assessment: 0-10 Pain Score: 5  Pain Location: L side Pain Descriptors / Indicators: Dull Pain Intervention(s): Limited activity within patient's tolerance, Monitored during session, Repositioned     Extremity/Trunk Assessment Upper Extremity Assessment Upper Extremity Assessment: Overall WFL for tasks assessed   Lower Extremity Assessment Lower Extremity Assessment: Defer to PT evaluation   Cervical / Trunk Assessment Cervical / Trunk Assessment: Other exceptions Cervical /  Trunk Exceptions: rib fx L   Communication Communication Communication: No apparent difficulties   Cognition Arousal: Alert Behavior During Therapy: WFL for tasks assessed/performed Overall Cognitive Status: Within Functional Limits for tasks assessed                                       General Comments  family at side and supportive; Spo2 on RA desatruated to 89% at rest with DOE 3/4. On 4L during activity Spo2 stable >92%, HR 110s and BP at end of session 177/81 (RN notified)    Exercises     Shoulder Instructions      Home Living  Family/patient expects to be discharged to:: Private residence Living Arrangements: Spouse/significant other Available Help at Discharge: Family Type of Home: House Home Access: Stairs to enter Secretary/administrator of Steps: 2 Entrance Stairs-Rails: Can reach both Home Layout: One level     Bathroom Shower/Tub: Chief Strategy Officer: Standard     Home Equipment: None   Additional Comments: CPAP      Prior Functioning/Environment Prior Level of Function : Independent/Modified Independent;Driving;History of Falls (last six months)               ADLs Comments: ADLs/IADLs        OT Problem List: Decreased strength;Decreased activity tolerance;Pain;Obesity;Cardiopulmonary status limiting activity;Decreased knowledge of precautions;Decreased knowledge of use of DME or AE      OT Treatment/Interventions: Self-care/ADL training;Therapeutic exercise;DME and/or AE instruction;Patient/family education;Balance training;Therapeutic activities;Energy conservation    OT Goals(Current goals can be found in the care plan section) Acute Rehab OT Goals Patient Stated Goal: home, less pain OT Goal Formulation: With patient Time For Goal Achievement: 07/18/23 Potential to Achieve Goals: Good  OT Frequency: Min 1X/week    Co-evaluation              AM-PAC OT "6 Clicks" Daily Activity     Outcome Measure Help from another person eating meals?: None Help from another person taking care of personal grooming?: A Little Help from another person toileting, which includes using toliet, bedpan, or urinal?: A Little Help from another person bathing (including washing, rinsing, drying)?: A Little Help from another person to put on and taking off regular upper body clothing?: A Little Help from another person to put on and taking off regular lower body clothing?: A Little 6 Click Score: 19   End of Session Equipment Utilized During Treatment: Rolling walker (2 wheels);Gait  belt;Oxygen (4L) Nurse Communication: Mobility status;Other (comment) (BP)  Activity Tolerance: Patient tolerated treatment well Patient left: in chair;with call bell/phone within reach;with family/visitor present  OT Visit Diagnosis: Unsteadiness on feet (R26.81);Pain Pain - Right/Left: Left Pain - part of body:  (flank)                Time: 6433-2951 OT Time Calculation (min): 30 min Charges:  OT General Charges $OT Visit: 1 Visit OT Evaluation $OT Eval Moderate Complexity: 1 Mod  Barry Brunner, OT Acute Rehabilitation Services Office [phone]   Chancy Milroy 07/04/2023, 12:43 PM

## 2023-07-04 NOTE — H&P (Signed)
Reason for Consult/Chief Complaint: rib fractures Consultant: Scotty Court, MD  Endy Mazzaferro is an 77 y.o. male.   HPI: 21M presented after a syncopal fall on 8/26. Patient also notes he fell out of bed two days prior to that. He reports a two week history of left sided chest pain that he went to [facility] x2 for and was diagnosed with pneumonia and given abx. Regarding the fall yesterday, patient reports a prodrome and states that he landed on his face. He reports the first BP that was taken was a systolic in the 70s.    Past Medical History:  Diagnosis Date   Arthritis    Depression    Hypertension    Sleep apnea     Past Surgical History:  Procedure Laterality Date   BALLOON DILATION N/A 05/11/2016   Procedure: BALLOON DILATION;  Surgeon: Scot Jun, MD;  Location: [facility] ENDOSCOPY;  Service: Endoscopy;  Laterality: N/A;   CHOLECYSTECTOMY     ESOPHAGOGASTRODUODENOSCOPY N/A 04/15/2016   Procedure: ESOPHAGOGASTRODUODENOSCOPY (EGD) with removal of foreign bodu;  Surgeon: Midge Minium, MD;  Location: [facility] ENDOSCOPY;  Service: Endoscopy;  Laterality: N/A;   ESOPHAGOGASTRODUODENOSCOPY (EGD) WITH PROPOFOL N/A 05/11/2016   Procedure: ESOPHAGOGASTRODUODENOSCOPY (EGD) WITH PROPOFOL;  Surgeon: Scot Jun, MD;  Location: [facility] ENDOSCOPY;  Service: Endoscopy;  Laterality: N/A;    Family History  Problem Relation Age of Onset   Depression Mother    Thyroid disease Mother    Alcohol abuse Father    Emphysema Father    Breast cancer Sister        two times   Depression Sister    Depression Sister    Alcohol abuse Brother    Alcohol abuse Paternal Aunt     Social History:  reports that he quit smoking about 42 years ago. His smoking use included cigarettes. He quit smokeless tobacco use about 42 years ago. He reports that he does not drink alcohol and does not use drugs.  Allergies: No Known Allergies  Medications: I have reviewed the patient's current medications.  Results for  orders placed or performed during the hospital encounter of 07/04/23 (from the past 48 hour(s))  MRSA Next Gen by PCR, Nasal     Status: None   Collection Time: 07/04/23  1:01 AM   Specimen: Nasal Mucosa; Nasal Swab  Result Value Ref Range   MRSA by PCR Next Gen NOT DETECTED NOT DETECTED    Comment: (NOTE) The GeneXpert MRSA Assay (FDA approved for NASAL specimens only), is one component of a comprehensive MRSA colonization surveillance program. It is not intended to diagnose MRSA infection nor to guide or monitor treatment for MRSA infections. Test performance is not FDA approved in patients less than 71 years old. Performed at [facility], [address], [place], [place] [address]     CT Chest Wo Contrast  Result Date: 07/03/2023 CLINICAL DATA:  Blunt chest trauma. EXAM: CT CHEST WITHOUT CONTRAST TECHNIQUE: Multidetector CT imaging of the chest was performed following the standard protocol without IV contrast. RADIATION DOSE REDUCTION: This exam was performed according to the departmental dose-optimization program which includes automated exposure control, adjustment of the mA and/or kV according to patient size and/or use of iterative reconstruction technique. COMPARISON:  Chest radiograph 07/03/2023.  CT chest 11/02/2017 FINDINGS: Cardiovascular: Normal heart size. No pericardial effusions. Calcification of the aorta and coronary arteries. No aneurysm. Mediastinum/Nodes: Thyroid gland is unremarkable. Esophagus is decompressed. No significant lymphadenopathy. Lungs/Pleura: Small left pleural  effusion with basilar atelectasis or consolidation. Right lung is clear. No pneumothoraces. Upper Abdomen: Surgical absence of the gallbladder. Cyst in the right kidney measuring 7.1 cm diameter. No change since prior study. No imaging follow-up is indicated. Musculoskeletal: Multiple acute left rib fractures involving the posterior left sixth, seventh, eighth, ninth, tenth, and eleventh ribs with  severe displacement of the seventh, eighth, ninth, and tenth ribs. Left chest wall stranding and hematoma adjacent to the rib fractures with focal collection/hematoma measuring about 4 x 9.7 cm. IMPRESSION: 1. Multiple acute left rib fractures, some with severe displacement. 2. Left pleural effusion with consolidation or atelectasis in the left lung base, possibly pulmonary contusion. No pneumothorax. Associated left chest wall hematomas. 3. Aortic atherosclerosis. Electronically Signed   By: Burman Nieves M.D.   On: 07/03/2023 18:48   CT ABDOMEN PELVIS W CONTRAST  Result Date: 07/03/2023 CLINICAL DATA:  Lightheadedness, syncope, fell, abdominal pain EXAM: CT ABDOMEN AND PELVIS WITH CONTRAST TECHNIQUE: Multidetector CT imaging of the abdomen and pelvis was performed using the standard protocol following bolus administration of intravenous contrast. RADIATION DOSE REDUCTION: This exam was performed according to the departmental dose-optimization program which includes automated exposure control, adjustment of the mA and/or kV according to patient size and/or use of iterative reconstruction technique. CONTRAST:  OMNIPAQUE IOHEXOL 300 MG/ML  SOLN COMPARISON:  07/01/2019. 06/28/2023 report only, images unavailable. FINDINGS: Lower chest: There is a small left hemothorax, with blood products dependent within the left posterior costophrenic angle. There are multiple displaced there are displaced left posterior seventh, eighth, and ninth rib fractures. The left ninth rib fracture is likely segmental, with dissociation of the left rib anteriorly from the costochondral junction. This results and left lower lateral thoracic hernia containing portions of the left long and left upper quadrant abdominal contents. Diffuse subcutaneous edema within the left posterolateral chest wall and left upper abdominal wall consistent with contusion and direct trauma. Hepatobiliary: Prior cholecystectomy. No acute liver injury.  No biliary duct dilation. Pancreas: Unremarkable. No pancreatic ductal dilatation or surrounding inflammatory changes. Spleen: No splenic injury or perisplenic hematoma. Adrenals/Urinary Tract: No evidence of acute renal injury. Multiple bilateral simple appearing renal cysts do not require specific imaging follow-up. 0.9 cm exophytic hyperdensity off the ventral aspect left kidney image 48/2 measures 73 HU, indeterminate. Punctate 2 mm nonobstructing calculi upper pole left kidney. No right-sided calculi. No obstructive uropathy within either kidney. The adrenals and bladder are unremarkable. Stomach/Bowel: No bowel obstruction or ileus. Moderate stool throughout the colon consistent with constipation. No bowel wall thickening or inflammatory change. Vascular/Lymphatic: Aortic atherosclerosis. No enlarged abdominal or pelvic lymph nodes. Reproductive: Prostate is enlarged measuring 5.4 x 4.7 cm. Other: No free intraperitoneal fluid or free gas. Herniation of the left lower chest and left upper quadrant abdomen through the left thoracic cage defect created by the segmental left ninth rib fracture as described above. No other abdominal wall hernia. Musculoskeletal: Left posterior seventh, eighth, and ninth rib fractures as above. The ninth rib fracture is segmental, with dissociation of the left ninth costochondral junction. There is herniation of the left lower lobe and left upper quadrant abdominal contents through the defect created by the segmental left ninth rib fracture. Overlying subcutaneous edema and subcutaneous hematoma within the left lateral chest wall and left upper quadrant abdominal wall consistent with direct trauma. No other acute displaced fractures. Reconstructed images demonstrate no additional findings. IMPRESSION: 1. Left seventh through ninth rib fractures as above, with segmental ninth rib fracture creating a  left lower thoracic cage defect with subsequent herniation of the left lower lobe  and left upper quadrant abdominal contents through the defect as described above. 2. Extensive left chest wall and left upper quadrant abdominal wall subcutaneous fat stranding and subcutaneous hematoma consistent with direct trauma and contusion. 3. Small left hemothorax.  No evidence of pneumothorax. 4. Punctate nonobstructing left renal calculi. 5. Indeterminate subcentimeter hyperdense lesion ventral aspect left kidney, too small to characterize. This could reflect a hyperdense cyst, and follow-up nonemergent ultrasound may be useful to exclude solid lesion. Other bilateral simple renal cysts do not require specific imaging follow-up. 6.  Aortic Atherosclerosis (ICD10-I70.0). 7. Enlarged prostate. Electronically Signed   By: Sharlet Salina M.D.   On: 07/03/2023 16:54   CT Head Wo Contrast  Result Date: 07/03/2023 CLINICAL DATA:  Fall with head/face strike. EXAM: CT HEAD WITHOUT CONTRAST CT MAXILLOFACIAL WITHOUT CONTRAST CT CERVICAL SPINE WITHOUT CONTRAST TECHNIQUE: Multidetector CT imaging of the head, cervical spine, and maxillofacial structures were performed using the standard protocol without intravenous contrast. Multiplanar CT image reconstructions of the cervical spine and maxillofacial structures were also generated. RADIATION DOSE REDUCTION: This exam was performed according to the departmental dose-optimization program which includes automated exposure control, adjustment of the mA and/or kV according to patient size and/or use of iterative reconstruction technique. COMPARISON:  None Available. FINDINGS: CT HEAD FINDINGS Brain: No acute intracranial hemorrhage. Gray-white differentiation is preserved. No hydrocephalus or midline shift. Left middle cranial fossa arachnoid cyst with mild mass effect on the left anterior temporal lobe. Vascular: No hyperdense vessel or unexpected calcification. Skull: No calvarial fracture or suspicious bone lesion. Skull base is unremarkable. Other: None. CT  MAXILLOFACIAL FINDINGS Osseous: No fracture or mandibular dislocation. No destructive process. Orbits: Negative. No traumatic or inflammatory finding. Sinuses: Clear. Soft tissues: Unremarkable. CT CERVICAL SPINE FINDINGS Alignment: Normal. Skull base and vertebrae: No acute fracture. Normal craniocervical junction. No suspicious bone lesions. Soft tissues and spinal canal: No prevertebral fluid or swelling. No visible canal hematoma. Disc levels: Mild cervical spondylosis without high-grade spinal canal stenosis. Upper chest: No acute findings. Other: None. IMPRESSION: 1. No acute intracranial abnormality. 2. No acute facial bone fracture. 3. No acute fracture or traumatic listhesis of the cervical spine. 4. Left middle cranial fossa arachnoid cyst with mild mass effect on the left anterior temporal lobe. Electronically Signed   By: Orvan Falconer M.D.   On: 07/03/2023 16:48   CT Maxillofacial W Contrast  Result Date: 07/03/2023 CLINICAL DATA:  Fall with head/face strike. EXAM: CT HEAD WITHOUT CONTRAST CT MAXILLOFACIAL WITHOUT CONTRAST CT CERVICAL SPINE WITHOUT CONTRAST TECHNIQUE: Multidetector CT imaging of the head, cervical spine, and maxillofacial structures were performed using the standard protocol without intravenous contrast. Multiplanar CT image reconstructions of the cervical spine and maxillofacial structures were also generated. RADIATION DOSE REDUCTION: This exam was performed according to the departmental dose-optimization program which includes automated exposure control, adjustment of the mA and/or kV according to patient size and/or use of iterative reconstruction technique. COMPARISON:  None Available. FINDINGS: CT HEAD FINDINGS Brain: No acute intracranial hemorrhage. Gray-white differentiation is preserved. No hydrocephalus or midline shift. Left middle cranial fossa arachnoid cyst with mild mass effect on the left anterior temporal lobe. Vascular: No hyperdense vessel or unexpected  calcification. Skull: No calvarial fracture or suspicious bone lesion. Skull base is unremarkable. Other: None. CT MAXILLOFACIAL FINDINGS Osseous: No fracture or mandibular dislocation. No destructive process. Orbits: Negative. No traumatic or inflammatory finding. Sinuses: Clear. Soft tissues: Unremarkable.  CT CERVICAL SPINE FINDINGS Alignment: Normal. Skull base and vertebrae: No acute fracture. Normal craniocervical junction. No suspicious bone lesions. Soft tissues and spinal canal: No prevertebral fluid or swelling. No visible canal hematoma. Disc levels: Mild cervical spondylosis without high-grade spinal canal stenosis. Upper chest: No acute findings. Other: None. IMPRESSION: 1. No acute intracranial abnormality. 2. No acute facial bone fracture. 3. No acute fracture or traumatic listhesis of the cervical spine. 4. Left middle cranial fossa arachnoid cyst with mild mass effect on the left anterior temporal lobe. Electronically Signed   By: Orvan Falconer M.D.   On: 07/03/2023 16:48   CT Cervical Spine Wo Contrast  Result Date: 07/03/2023 CLINICAL DATA:  Fall with head/face strike. EXAM: CT HEAD WITHOUT CONTRAST CT MAXILLOFACIAL WITHOUT CONTRAST CT CERVICAL SPINE WITHOUT CONTRAST TECHNIQUE: Multidetector CT imaging of the head, cervical spine, and maxillofacial structures were performed using the standard protocol without intravenous contrast. Multiplanar CT image reconstructions of the cervical spine and maxillofacial structures were also generated. RADIATION DOSE REDUCTION: This exam was performed according to the departmental dose-optimization program which includes automated exposure control, adjustment of the mA and/or kV according to patient size and/or use of iterative reconstruction technique. COMPARISON:  None Available. FINDINGS: CT HEAD FINDINGS Brain: No acute intracranial hemorrhage. Gray-white differentiation is preserved. No hydrocephalus or midline shift. Left middle cranial fossa  arachnoid cyst with mild mass effect on the left anterior temporal lobe. Vascular: No hyperdense vessel or unexpected calcification. Skull: No calvarial fracture or suspicious bone lesion. Skull base is unremarkable. Other: None. CT MAXILLOFACIAL FINDINGS Osseous: No fracture or mandibular dislocation. No destructive process. Orbits: Negative. No traumatic or inflammatory finding. Sinuses: Clear. Soft tissues: Unremarkable. CT CERVICAL SPINE FINDINGS Alignment: Normal. Skull base and vertebrae: No acute fracture. Normal craniocervical junction. No suspicious bone lesions. Soft tissues and spinal canal: No prevertebral fluid or swelling. No visible canal hematoma. Disc levels: Mild cervical spondylosis without high-grade spinal canal stenosis. Upper chest: No acute findings. Other: None. IMPRESSION: 1. No acute intracranial abnormality. 2. No acute facial bone fracture. 3. No acute fracture or traumatic listhesis of the cervical spine. 4. Left middle cranial fossa arachnoid cyst with mild mass effect on the left anterior temporal lobe. Electronically Signed   By: Orvan Falconer M.D.   On: 07/03/2023 16:48   DG Chest Port 1 View  Result Date: 07/03/2023 CLINICAL DATA:  Sepsis, cough and congestion. EXAM: PORTABLE CHEST 1 VIEW COMPARISON:  04/15/2016 FINDINGS: The heart size and mediastinal contours are within normal limits. Lung volumes are very low bilaterally. There is no evidence of pulmonary edema, consolidation, pneumothorax or pleural fluid. The visualized skeletal structures are unremarkable. IMPRESSION: Low lung volumes. Electronically Signed   By: Irish Lack M.D.   On: 07/03/2023 13:08    ROS 10 point review of systems is negative except as listed above in HPI.   Physical Exam Blood pressure (!) 148/75, pulse (!) 104, temperature 98.1 F (36.7 C), temperature source Oral, resp. rate (!) 21, height 5\' 10"  (1.778 m), weight 121.9 kg, SpO2 97%. Constitutional: well-developed,  well-nourished HEENT: pupils equal, round, reactive to light, 2mm b/l, moist conjunctiva, external inspection of ears and nose normal, hearing intact Oropharynx: normal oropharyngeal mucosa, normal dentition Neck: no thyromegaly, trachea midline, no midline cervical tenderness to palpation Chest: breath sounds equal bilaterally, normal respiratory effort, + L lateral chest wall tenderness to palpation and deformity Abdomen: soft, NT, no bruising, no hepatosplenomegaly Back: no wounds, no thoracic/lumbar spine tenderness to palpation, no  thoracic/lumbar spine stepoffs Rectal: deferred Extremities: 2+ radial and pedal pulses bilaterally, intact motor and sensation bilateral UE and LE, no peripheral edema MSK: unable to assess gait/station, no clubbing/cyanosis of fingers/toes, normal ROM of all four extremities Skin: warm, dry, no rashes Psych: normal memory, normal mood/affect     Assessment/Plan: Syncopal fall  Syncope - echo, carotid US, EKG L rib fx 6-11 with flail segment - aggressive pulm toilet, case d/w Anesthesia for regional pain control with epidural, consider d/w TCTS re: plating FEN - regular diet DVT - SCDs, LMWH Dispo - ICU    Diamantina Monks, MD General and Trauma Surgery [facility] Surgery

## 2023-07-04 NOTE — TOC CAGE-AID Note (Signed)
Transition of Care [facility]) - CAGE-AID Screening  Patient Details  Name: Mario Knapp MRN: [id number] Date of Birth: 03-Mar-1946  Clinical Narrative:  Patient admitted after a syncopal episode at home. Patient denies any alcohol or drug use, no need for substance abuse resources at this time.  CAGE-AID Screening:   Have You Ever Felt You Ought to Cut Down on Your Drinking or Drug Use?: No Have People Annoyed You By Critizing Your Drinking Or Drug Use?: No Have You Felt Bad Or Guilty About Your Drinking Or Drug Use?: No Have You Ever Had a Drink or Used Drugs First Thing In The Morning to Steady Your Nerves or to Get Rid of a Hangover?: No CAGE-AID Score: 0  Substance Abuse Education Offered: No

## 2023-07-04 NOTE — ED Notes (Signed)
..  EMTALA: REQUIRED DOCUMENTATION COMPLETED AND REVIEWED BY WRITER PRIOR TO PT TRANSFER MD REASSESSMENT EMTALA RN SECTION TRANSFER E-SIGN VS WITHIN REQUIRED TIME

## 2023-07-05 ENCOUNTER — Inpatient Hospital Stay (HOSPITAL_COMMUNITY): Payer: PPO | Admitting: Anesthesiology

## 2023-07-05 ENCOUNTER — Inpatient Hospital Stay (HOSPITAL_COMMUNITY): Payer: PPO

## 2023-07-05 DIAGNOSIS — S2242XA Multiple fractures of ribs, left side, initial encounter for closed fracture: Secondary | ICD-10-CM | POA: Diagnosis not present

## 2023-07-05 DIAGNOSIS — I10 Essential (primary) hypertension: Secondary | ICD-10-CM | POA: Diagnosis not present

## 2023-07-05 DIAGNOSIS — R55 Syncope and collapse: Secondary | ICD-10-CM | POA: Diagnosis not present

## 2023-07-05 LAB — TYPE AND SCREEN
ABO/RH(D): O NEG
Antibody Screen: NEGATIVE

## 2023-07-05 LAB — GLUCOSE, CAPILLARY
Glucose-Capillary: 173 mg/dL — ABNORMAL HIGH (ref 70–99)
Glucose-Capillary: 193 mg/dL — ABNORMAL HIGH (ref 70–99)
Glucose-Capillary: 211 mg/dL — ABNORMAL HIGH (ref 70–99)
Glucose-Capillary: 227 mg/dL — ABNORMAL HIGH (ref 70–99)
Glucose-Capillary: 268 mg/dL — ABNORMAL HIGH (ref 70–99)
Glucose-Capillary: 318 mg/dL — ABNORMAL HIGH (ref 70–99)

## 2023-07-05 MED ORDER — MAGNESIUM CITRATE PO SOLN
1.0000 | Freq: Once | ORAL | Status: DC
  Filled 2023-07-05: qty 296

## 2023-07-05 MED ORDER — BISACODYL 5 MG PO TBEC
10.0000 mg | DELAYED_RELEASE_TABLET | Freq: Once | ORAL | Status: AC
  Administered 2023-07-05: 10 mg via ORAL
  Filled 2023-07-05: qty 2

## 2023-07-05 MED ORDER — BISACODYL 10 MG RE SUPP
10.0000 mg | Freq: Once | RECTAL | Status: AC
  Administered 2023-07-05: 10 mg via RECTAL
  Filled 2023-07-05: qty 1

## 2023-07-05 MED ORDER — SORBITOL 70 % SOLN
960.0000 mL | TOPICAL_OIL | Freq: Once | ORAL | Status: DC
  Filled 2023-07-05: qty 240

## 2023-07-05 MED ORDER — MAGNESIUM HYDROXIDE 400 MG/5ML PO SUSP
30.0000 mL | Freq: Once | ORAL | Status: AC
  Administered 2023-07-05: 30 mL via ORAL
  Filled 2023-07-05: qty 30

## 2023-07-05 MED ORDER — MELATONIN 3 MG PO TABS
3.0000 mg | ORAL_TABLET | Freq: Every day | ORAL | Status: DC
  Administered 2023-07-05 – 2023-07-14 (×9): 3 mg via ORAL
  Filled 2023-07-05 (×10): qty 1

## 2023-07-05 MED ORDER — SENNA 8.6 MG PO TABS
2.0000 | ORAL_TABLET | Freq: Once | ORAL | Status: AC
  Administered 2023-07-05: 17.2 mg via ORAL
  Filled 2023-07-05: qty 2

## 2023-07-05 MED ORDER — TRAZODONE HCL 50 MG PO TABS
50.0000 mg | ORAL_TABLET | Freq: Every evening | ORAL | Status: DC | PRN
  Administered 2023-07-05 – 2023-07-11 (×5): 50 mg via ORAL
  Filled 2023-07-05 (×6): qty 1

## 2023-07-05 MED ORDER — POLYETHYLENE GLYCOL 3350 17 G PO PACK
17.0000 g | PACK | Freq: Two times a day (BID) | ORAL | Status: DC
  Administered 2023-07-05 – 2023-07-06 (×3): 17 g via ORAL
  Filled 2023-07-05 (×5): qty 1

## 2023-07-05 MED ORDER — SENNA 8.6 MG PO TABS
2.0000 | ORAL_TABLET | Freq: Once | ORAL | Status: AC
  Administered 2023-07-06: 17.2 mg via ORAL
  Filled 2023-07-05: qty 2

## 2023-07-05 MED ORDER — FENTANYL CITRATE PF 50 MCG/ML IJ SOSY
50.0000 ug | PREFILLED_SYRINGE | Freq: Once | INTRAMUSCULAR | Status: AC
  Administered 2023-07-05: 50 ug via INTRAVENOUS

## 2023-07-05 MED ORDER — FENTANYL CITRATE (PF) 100 MCG/2ML IJ SOLN
INTRAMUSCULAR | Status: AC
  Filled 2023-07-05: qty 2

## 2023-07-05 MED ORDER — LIDOCAINE-EPINEPHRINE (PF) 1.5 %-1:200000 IJ SOLN
INTRAMUSCULAR | Status: DC | PRN
  Administered 2023-07-05: 3 mg via EPIDURAL
  Administered 2023-07-05: 5 mg via EPIDURAL

## 2023-07-05 MED ORDER — MAGNESIUM CITRATE PO SOLN
1.0000 | Freq: Once | ORAL | Status: AC
  Administered 2023-07-05: 1 via ORAL
  Filled 2023-07-05: qty 296

## 2023-07-05 MED ORDER — FENTANYL CITRATE PF 50 MCG/ML IJ SOSY: 50.0000 ug | PREFILLED_SYRINGE | Freq: Once | INTRAMUSCULAR | Status: DC

## 2023-07-05 MED ORDER — ROPIVACAINE HCL 2 MG/ML IJ SOLN
10.0000 mL/h | INTRAMUSCULAR | Status: DC
  Administered 2023-07-05 – 2023-07-10 (×7): 10 mL/h via EPIDURAL
  Filled 2023-07-05 (×2): qty 200
  Filled 2023-07-05: qty 20
  Filled 2023-07-05: qty 200
  Filled 2023-07-05: qty 20
  Filled 2023-07-05 (×2): qty 200
  Filled 2023-07-05: qty 20
  Filled 2023-07-05: qty 200
  Filled 2023-07-05: qty 20
  Filled 2023-07-05 (×5): qty 200
  Filled 2023-07-05: qty 20

## 2023-07-05 MED ORDER — SORBITOL 70 % SOLN
960.0000 mL | TOPICAL_OIL | Freq: Once | ORAL | Status: AC
  Administered 2023-07-05 – 2023-07-06 (×2): 960 mL via RECTAL
  Filled 2023-07-05: qty 240

## 2023-07-05 MED ORDER — FENTANYL CITRATE (PF) 100 MCG/2ML IJ SOLN
INTRAMUSCULAR | Status: AC
  Administered 2023-07-05: 50 ug
  Filled 2023-07-05: qty 2

## 2023-07-05 MED ORDER — METHOCARBAMOL 500 MG PO TABS
750.0000 mg | ORAL_TABLET | Freq: Three times a day (TID) | ORAL | Status: DC
  Administered 2023-07-05 – 2023-07-07 (×6): 750 mg via ORAL
  Filled 2023-07-05 (×6): qty 2

## 2023-07-05 MED ORDER — OXYCODONE HCL 5 MG PO TABS
5.0000 mg | ORAL_TABLET | ORAL | Status: DC | PRN
  Administered 2023-07-05 – 2023-07-07 (×5): 10 mg via ORAL
  Administered 2023-07-07: 5 mg via ORAL
  Administered 2023-07-09 – 2023-07-14 (×18): 10 mg via ORAL
  Filled 2023-07-05 (×25): qty 2

## 2023-07-05 NOTE — Anesthesia Procedure Notes (Signed)
Epidural Patient location during procedure: holding area Start time: 07/05/2023 10:25 AM End time: 07/05/2023 10:45 AM  Staffing Anesthesiologist: Collene Schlichter, MD Performed: anesthesiologist   Preanesthetic Checklist Completed: patient identified, IV checked, risks and benefits discussed, monitors and equipment checked, pre-op evaluation and timeout performed  Epidural Patient position: sitting Prep: DuraPrep Patient monitoring: blood pressure and continuous pulse ox Approach: midline Location: thoracic (1-12) Injection technique: LOR air  Needle:  Needle type: Tuohy  Needle gauge: 17 G Needle length: 9 cm Needle insertion depth: 6 cm Catheter size: 19 Gauge Catheter at skin depth: 12 cm Test dose: negative and 1.5% lidocaine with Epi 1:200 K  Additional Notes Patient identified.  Risk benefits discussed including failed block, incomplete pain control, headache, nerve damage, paralysis, blood pressure changes, nausea, vomiting, reactions to medication both toxic or allergic, and postpartum back pain.  Patient expressed understanding and wished to proceed.  All questions were answered.  Sterile technique used throughout procedure and epidural site dressed with sterile barrier dressing. No paresthesia or other complications noted. The patient did not experience any signs of intravascular injection such as tinnitus or metallic taste in mouth nor signs of intrathecal spread such as rapid motor block. Please see nursing notes for vital signs. Reason for block:procedure for pain

## 2023-07-05 NOTE — Progress Notes (Signed)
   Trauma/Critical Care Follow Up Note  Subjective:    Overnight Issues:   Objective:  Vital signs for last 24 hours: Temp:  [97.6 F (36.4 C)-98.3 F (36.8 C)] 97.6 F (36.4 C) (08/28 0800) Pulse Rate:  [86-126] 111 (08/28 0800) Resp:  [14-32] 24 (08/28 0800) BP: (84-183)/(45-132) 133/67 (08/28 0800) SpO2:  [88 %-100 %] 91 % (08/28 0800)  Hemodynamic parameters for last 24 hours:    Intake/Output from previous day: 08/27 0701 - 08/28 0700 In: 172.5 [IV Piggyback:172.5] Out: 950 [Urine:950]  Intake/Output this shift: No intake/output data recorded.  Vent settings for last 24 hours:    Physical Exam:  Gen: comfortable, no distress Neuro: follows commands, alert, communicative HEENT: PERRL Neck: supple CV: RRR Pulm: unlabored breathing on [place], 4L, just under 1000 on IS today, lower than yesterday Abd: soft, NT , distended  GU: urine clear and yellow, +spontaneous voids Extr: wwp, no edema  Results for orders placed or performed during the hospital encounter of 07/04/23 (from the past 24 hour(s))  Glucose, capillary     Status: Abnormal   Collection Time: 07/04/23 11:18 AM  Result Value Ref Range   Glucose-Capillary 293 (H) 70 - 99 mg/dL  Glucose, capillary     Status: Abnormal   Collection Time: 07/04/23  3:45 PM  Result Value Ref Range   Glucose-Capillary 270 (H) 70 - 99 mg/dL  Glucose, capillary     Status: Abnormal   Collection Time: 07/04/23  7:43 PM  Result Value Ref Range   Glucose-Capillary 289 (H) 70 - 99 mg/dL  Glucose, capillary     Status: Abnormal   Collection Time: 07/04/23 11:40 PM  Result Value Ref Range   Glucose-Capillary 207 (H) 70 - 99 mg/dL  Glucose, capillary     Status: Abnormal   Collection Time: 07/05/23  3:33 AM  Result Value Ref Range   Glucose-Capillary 173 (H) 70 - 99 mg/dL  Glucose, capillary     Status: Abnormal   Collection Time: 07/05/23  7:24 AM  Result Value Ref Range   Glucose-Capillary 193 (H) 70 - 99 mg/dL     Assessment & Plan: The plan of care was discussed with the bedside nurse for the day, who is in agreement with this plan and no additional concerns were raised.   Present on Admission: **None**    LOS: 1 day   Additional comments:I reviewed the patient's new clinical lab test results.   and I reviewed the patients new imaging test results.    Syncopal fall   Syncope - echo completed, carotid US pending, EKG pending L rib fx 6-11 with flail segment - aggressive pulm toilet, case d/w Anesthesia again this AM for regional pain control with epidural, will need SQH for DVT ppx while epidural in place, consider d/w TCTS re: plating Insomnia/delirium - maintain sleep wake cycle during the day, add sleep aid FEN - regular diet, add bowel regimen DVT - SCDs, SQH after epidural Dispo - ICU   Diamantina Monks, MD Trauma & General Surgery Please use [email or web address] to contact on call provider  07/05/2023  *Care during the described time interval was provided by me. I have reviewed this patient's available data, including medical history, events of note, physical examination and test results as part of my evaluation.

## 2023-07-05 NOTE — Progress Notes (Addendum)
CT Surgery  Patient  examined and CT scan of chest personally reviewed. Patient has several rib fractures of the lower left chest with mild pleural effusion and small chest wall hematoma.  2 of the ribs have moderate displacement but patient is stable with oxygen saturation of 93% on 2 L nasal cannula.  At age 77 with morbid obesity he is a poor operative candidate for rib plating and would recommend continued conservative therapy.

## 2023-07-05 NOTE — Progress Notes (Signed)
Pt home unit @bedside  but will try and sleep w/out this evening. Bridge of nose is still bandaged and sore from fall.   07/04/23 2330  BiPAP/CPAP/SIPAP  Patient Home Equipment Yes

## 2023-07-05 NOTE — Consult Note (Addendum)
[address]       [place] [address]             [id number]        Marston Shew[place] Medical Record #[id number] Date of Birth: 1945-12-12  Referring: Dr. Kris Mouton, MD Primary Care: Gracelyn Nurse, MD Primary Cardiologist:None  Chief Complaint:   Left rib fractures with severe displacement, herniation of LLL and LUQ abdominal contents Reason for consultation: Consideration for rib plating  History of Present Illness:     This is a 77 year old male with a past medical history of OSA (on CPAP), morbid obesity, hyperlipidemia, hypertension, remote tobacco abuse, arthritis, and depression who on 07/03/2023 had a syncopal episode and while he was walking out of the bathroom, he felt "funny", he became lightheaded, and had a syncopal episode.  Per patient, he landed face first onto the floor. He had a nosebleed and pain around his nose. He denied chest pain or palpitations prior the incident.  After "coming to", he called 911 and was transported to the [facility] ED on 07/03/2023 for further evaluation. He was found to be hypotensive. His WBC was 18,000 and lactic acid was mildly elevated at 2.3. CXR showed low lung volumes. CT of chest/abdomen/pelvis showed left rib fractures 7th through 9th with segmental 9th rib fracture creating a left lower thoracic cage defect with subsequent herniation of the left lower lobe and left upper quadrant abdominal contents, extensive left chest wall subcutaneous hematoma, small left hemothorax, and aortic atherosclerosis. CT of the head showed no acute intracranial abnormality and left middle cranial fossa arachnoid cyst with mild mass effect on the left anterior temporal lobe.  He was transferred to [facility] for further evaluation and treatment.  Carotid duplex has been ordered (syncopal episode prior to admission). We are asked by Dr. Bedelia Person to evaluate the patient for consideration of left rib plating. At the time of my  exam, patient in PACU as he just received an epidural.  Current Activity/ Functional Status: Patient is independent with mobility/ambulation, transfers, ADL's, IADL's.   Zubrod Score: At the time of surgery this patient's most appropriate activity status/level should be described as: []     0    Normal activity, no symptoms [x]     1    Restricted in physical strenuous activity but ambulatory, able to do out light work []     2    Ambulatory and capable of self care, unable to do work activities, up and about more than 50% of the time                            []     3    Only limited self care, in bed greater than 50% of waking hours []     4    Completely disabled, no self care, confined to bed or chair []     5    Moribund  Past Medical History:  Diagnosis Date   Arthritis    Depression    Hypertension    Sleep apnea     Past Surgical History:  Procedure Laterality Date   BALLOON DILATION N/A 05/11/2016   Procedure: BALLOON DILATION;  Surgeon: Scot Jun, MD;  Location: [facility] ENDOSCOPY;  Service: Endoscopy;  Laterality: N/A;   CHOLECYSTECTOMY     ESOPHAGOGASTRODUODENOSCOPY N/A 04/15/2016   Procedure: ESOPHAGOGASTRODUODENOSCOPY (EGD) with removal of foreign bodu;  Surgeon: Ebony Hail  Servando Snare, MD;  Location: [facility] ENDOSCOPY;  Service: Endoscopy;  Laterality: N/A;   ESOPHAGOGASTRODUODENOSCOPY (EGD) WITH PROPOFOL N/A 05/11/2016   Procedure: ESOPHAGOGASTRODUODENOSCOPY (EGD) WITH PROPOFOL;  Surgeon: Scot Jun, MD;  Location: [facility] ENDOSCOPY;  Service: Endoscopy;  Laterality: N/A;    Social History   Tobacco Use  Smoking Status Former   Current packs/day: 0.00   Types: Cigarettes   Quit date: 02/25/1981   Years since quitting: 42.3  Smokeless Tobacco Former   Quit date: 02/25/1981    Social History   Substance and Sexual Activity  Alcohol Use No    Allergies: No Known Allergies  Current Facility-Administered Medications  Medication Dose Route Frequency Provider Last Rate Last  Admin   acetaminophen (TYLENOL) tablet 1,000 mg  1,000 mg Oral Q6H PRN Diamantina Monks, MD   1,000 mg at 07/05/23 6440   bisacodyl (DULCOLAX) EC tablet 10 mg  10 mg Oral Once Diamantina Monks, MD       bisacodyl (DULCOLAX) suppository 10 mg  10 mg Rectal Once Diamantina Monks, MD       buPROPion (WELLBUTRIN XL) 24 hr tablet 150 mg  150 mg Oral Daily Moise Boring, MD   150 mg at 07/04/23 2006   Chlorhexidine Gluconate Cloth 2 % PADS 6 each  6 each Topical Daily Diamantina Monks, MD   6 each at 07/04/23 1047   docusate sodium (COLACE) capsule 100 mg  100 mg Oral BID Diamantina Monks, MD   100 mg at 07/04/23 2114   escitalopram (LEXAPRO) tablet 10 mg  10 mg Oral Daily Moise Boring, MD   10 mg at 07/04/23 2006   hydrALAZINE (APRESOLINE) injection 10 mg  10 mg Intravenous Q2H PRN Diamantina Monks, MD   10 mg at 07/04/23 1838   insulin aspart (novoLOG) injection 0-20 Units  0-20 Units Subcutaneous Q4H Diamantina Monks, MD   4 Units at 07/05/23 3474   ketorolac (TORADOL) 15 MG/ML injection 15 mg  15 mg Intravenous Q6H Diamantina Monks, MD   15 mg at 07/05/23 0505   magnesium citrate solution 1 Bottle  1 Bottle Oral Once Diamantina Monks, MD       magnesium hydroxide (MILK OF MAGNESIA) suspension 30 mL  30 mL Oral Once Diamantina Monks, MD       melatonin tablet 3 mg  3 mg Oral QHS Lovick, Lennie Odor, MD       methocarbamol (ROBAXIN) tablet 750 mg  750 mg Oral Q8H PRN Diamantina Monks, MD       methocarbamol (ROBAXIN) tablet 750 mg  750 mg Oral TID Diamantina Monks, MD       metoprolol tartrate (LOPRESSOR) injection 5 mg  5 mg Intravenous Q6H PRN Diamantina Monks, MD       morphine (PF) 2 MG/ML injection 2 mg  2 mg Intravenous Q4H PRN Diamantina Monks, MD   2 mg at 07/05/23 0506   ondansetron (ZOFRAN) injection 4 mg  4 mg Intravenous Q6H PRN Diamantina Monks, MD       Oral care mouth rinse  15 mL Mouth Rinse 4 times per day Diamantina Monks, MD   15 mL at 07/05/23 0732   Oral care mouth rinse   15 mL Mouth Rinse PRN Diamantina Monks, MD       oxyCODONE (Oxy IR/ROXICODONE) immediate release tablet 5-10 mg  5-10 mg Oral Q4H PRN Diamantina Monks, MD  polyethylene glycol (MIRALAX / GLYCOLAX) packet 17 g  17 g Oral BID Diamantina Monks, MD       senna (SENOKOT) tablet 17.2 mg  2 tablet Oral Once Diamantina Monks, MD       sorbitol, milk of mag, mineral oil, glycerin (SMOG) enema  960 mL Rectal Once Diamantina Monks, MD       traZODone (DESYREL) tablet 50 mg  50 mg Oral QHS PRN Diamantina Monks, MD        Medications Prior to Admission  Medication Sig Dispense Refill Last Dose   albuterol (VENTOLIN HFA) 108 (90 Base) MCG/ACT inhaler Inhale 2 puffs into the lungs every 6 (six) hours as needed.      atorvastatin (LIPITOR) 10 MG tablet Take 10 mg by mouth daily.      benzonatate (TESSALON) 200 MG capsule Take 200 mg by mouth 2 (two) times daily as needed.      buPROPion (WELLBUTRIN XL) 150 MG 24 hr tablet Take 1 tablet (150 mg total) by mouth daily. 90 tablet 0    escitalopram (LEXAPRO) 10 MG tablet Take 1 tablet (10 mg total) by mouth daily. 90 tablet 1    finasteride (PROSCAR) 5 MG tablet TAKE 1 TABLET (5 MG TOTAL) BY MOUTH DAILY. 90 tablet 3    furosemide (LASIX) 20 MG tablet Take 1 tablet by mouth daily.      HYDROcodone bit-homatropine (HYCODAN) 5-1.5 MG/5ML syrup Take 5 mLs by mouth every 6 (six) hours as needed.      HYDROcodone-acetaminophen (NORCO/VICODIN) 5-325 MG tablet Take 1 tablet by mouth every 6 (six) hours as needed.      KLOR-CON M20 20 MEQ tablet Take 20 mEq by mouth daily.      lisinopril (ZESTRIL) 5 MG tablet Take 1 tablet by mouth daily.      metFORMIN (GLUCOPHAGE) 500 MG tablet Take by mouth.      Multiple Vitamin (MULTI-VITAMINS) TABS Take by mouth.      pantoprazole (PROTONIX) 40 MG tablet Take by mouth.      vitamin B-12 (CYANOCOBALAMIN) 1000 MCG tablet Take 1,000 mcg by mouth daily.      zaleplon (SONATA) 5 MG capsule Take 1 capsule (5 mg total) by mouth at  bedtime as needed for sleep. 30 capsule 1     Family History  Problem Relation Age of Onset   Depression Mother    Thyroid disease Mother    Alcohol abuse Father    Emphysema Father    Breast cancer Sister        two times   Depression Sister    Depression Sister    Alcohol abuse Brother    Alcohol abuse Paternal Aunt     Review of Systems:      Cardiac Review of Systems: Y or  [    ]= no  Chest Pain [  N  ] Pedal Edema [  N ]   Palpitations [N  ] Syncope  Klaus.Mock  ]    General Review of Systems: [Y] = yes [ N ]=no Resp:  wheezing[  N];   shortness of breath[  N]; l              Skin:hematoma [left chest wall, Y]  Heme/Lymph:   anemia[N  ];   Psych:depression[ Y ];   Endocrine: diabetes[ Y ];        Physical Exam: BP (!) 147/67 (BP Location: Right Arm)   Pulse (!) 114  Temp 98 F (36.7 C)   Resp (!) 25   Ht 5\' 10"  (1.778 m)   Wt 121.9 kg   SpO2 93%   BMI 38.56 kg/m    General appearance: cooperative and no distress Head: Normocephalic, without obvious abnormality, atraumatic Neck: no carotid bruit and supple, symmetrical, trachea midline Resp: Slightly diminished at bases L>R. Extensive ecchymosis left side/back Cardio: RRR Extremities: No LE edema Neurologic: Grossly normal  Diagnostic Studies & Laboratory data:     Recent Radiology Findings:   DG CHEST PORT 1 VIEW  Result Date: 07/04/2023 CLINICAL DATA:  Shortness of breath EXAM: PORTABLE CHEST 1 VIEW COMPARISON:  07/03/2023 FINDINGS: Shallow inspiration. Cardiac enlargement. Suggestion of small pleural effusions with basilar atelectasis. No pneumothorax. Calcification of the aorta. IMPRESSION: Cardiac enlargement. Shallow inspiration with possible effusions and atelectasis in the lung bases. Electronically Signed   By: Burman Nieves M.D.   On: 07/04/2023 18:27   ECHOCARDIOGRAM COMPLETE BUBBLE STUDY  Result Date: 07/04/2023    ECHOCARDIOGRAM REPORT   Patient Name:   Mario Knapp Date of Exam:  07/04/2023 Medical Rec #:  [id number]          Height:       70.0 in Accession #:    [id number]         Weight:       268.7 lb Date of Birth:  [birth year]           BSA:          2.367 m Patient Age:    77 years           BP:           173/78 mmHg Patient Gender: M                  HR:           96 bpm. Exam Location:  Inpatient Procedure: 2D Echo, Color Doppler, Cardiac Doppler, Intracardiac Opacification            Agent and Saline Contrast Bubble Study Indications:    Syncope  History:        Patient has no prior history of Echocardiogram examinations.                 Signs/Symptoms:Syncope; Risk Factors:Dyslipidemia, Hypertension,                 Diabetes and Sleep Apnea.  Sonographer:    Milbert Coulter Referring Phys: [id number] Diamantina Monks  Sonographer Comments: Patient is obese and no subcostal window. Image acquisition challenging due to patient body habitus. IMPRESSIONS  1. Left ventricular ejection fraction, by estimation, is 65 to 70%. The left ventricle has normal function. The left ventricle has no regional wall motion abnormalities. There is moderate left ventricular hypertrophy. Left ventricular diastolic parameters are consistent with Grade I diastolic dysfunction (impaired relaxation).  2. Right ventricular systolic function is hyperdynamic. The right ventricular size is normal. Tricuspid regurgitation signal is inadequate for assessing PA pressure.  3. Left atrial size was mildly dilated.  4. The mitral valve was not well visualized. No evidence of mitral valve regurgitation.  5. The aortic valve was not well visualized. Aortic valve regurgitation is mild.  6. Technically difficult study. Comparison(s): No prior Echocardiogram. FINDINGS  Left Ventricle: Intracavitary gradient related to dynamica function; maximal resting gradient not well characterized. Left ventricular ejection fraction, by estimation, is 65 to 70%. The left ventricle has normal function. The left ventricle has no  regional wall motion  abnormalities. Definity contrast agent was given IV to delineate the left ventricular endocardial borders. The left ventricular internal cavity size was normal in size. There is moderate left ventricular hypertrophy. Left ventricular  diastolic parameters are consistent with Grade I diastolic dysfunction (impaired relaxation). Right Ventricle: The right ventricular size is normal. No increase in right ventricular wall thickness. Right ventricular systolic function is hyperdynamic. Tricuspid regurgitation signal is inadequate for assessing PA pressure. Left Atrium: Left atrial size was mildly dilated. Right Atrium: Right atrial size was normal in size. Pericardium: There is no evidence of pericardial effusion. Mitral Valve: The mitral valve was not well visualized. No evidence of mitral valve regurgitation. Tricuspid Valve: The tricuspid valve is normal in structure. Tricuspid valve regurgitation is not demonstrated. No evidence of tricuspid stenosis. Aortic Valve: The aortic valve was not well visualized. Aortic valve regurgitation is mild. Aortic regurgitation PHT measures 321 msec. Aortic valve mean gradient measures 4.0 mmHg. Aortic valve peak gradient measures 8.4 mmHg. Aortic valve area, by VTI measures 4.08 cm. Pulmonic Valve: The pulmonic valve was not well visualized. Pulmonic valve regurgitation is mild. Aorta: The aortic root and ascending aorta are structurally normal, with no evidence of dilitation. Venous: The inferior vena cava was not well visualized. IAS/Shunts: The interatrial septum was not well visualized. Agitated saline contrast was given intravenously to evaluate for intracardiac shunting.  LEFT VENTRICLE PLAX 2D LVIDd:         4.40 cm   Diastology LVIDs:         2.30 cm   LV e' medial:    8.70 cm/s LV PW:         1.30 cm   LV E/e' medial:  10.5 LV IVS:        1.40 cm   LV e' lateral:   9.90 cm/s LVOT diam:     2.50 cm   LV E/e' lateral: 9.2 LV SV:         102 LV SV Index:   43 LVOT Area:      4.91 cm  RIGHT VENTRICLE RV S prime:     19.40 cm/s TAPSE (M-mode): 2.4 cm LEFT ATRIUM             Index        RIGHT ATRIUM           Index LA diam:        3.60 cm 1.52 cm/m   RA Area:     19.90 cm LA Vol (A2C):   76.0 ml 32.11 ml/m  RA Volume:   50.80 ml  21.47 ml/m LA Vol (A4C):   89.1 ml 37.65 ml/m LA Biplane Vol: 85.4 ml 36.09 ml/m  AORTIC VALVE AV Area (Vmax):    4.03 cm AV Area (Vmean):   4.21 cm AV Area (VTI):     4.08 cm AV Vmax:           145.00 cm/s AV Vmean:          95.500 cm/s AV VTI:            0.249 m AV Peak Grad:      8.4 mmHg AV Mean Grad:      4.0 mmHg LVOT Vmax:         119.00 cm/s LVOT Vmean:        81.900 cm/s LVOT VTI:          0.207 m LVOT/AV VTI ratio: 0.83 AI PHT:  321 msec  AORTA Ao Root diam: 3.10 cm Ao Asc diam:  3.10 cm MITRAL VALVE MV Area (PHT): 4.21 cm     SHUNTS MV Decel Time: 180 msec     Systemic VTI:  0.21 m MV E velocity: 91.20 cm/s   Systemic Diam: 2.50 cm MV A velocity: 111.00 cm/s MV E/A ratio:  0.82 Riley Lam MD Electronically signed by Riley Lam MD Signature Date/Time: 07/04/2023/10:08:08 AM    Final    CT Chest Wo Contrast  Result Date: 07/03/2023 CLINICAL DATA:  Blunt chest trauma. EXAM: CT CHEST WITHOUT CONTRAST TECHNIQUE: Multidetector CT imaging of the chest was performed following the standard protocol without IV contrast. RADIATION DOSE REDUCTION: This exam was performed according to the departmental dose-optimization program which includes automated exposure control, adjustment of the mA and/or kV according to patient size and/or use of iterative reconstruction technique. COMPARISON:  Chest radiograph 07/03/2023.  CT chest 11/02/2017 FINDINGS: Cardiovascular: Normal heart size. No pericardial effusions. Calcification of the aorta and coronary arteries. No aneurysm. Mediastinum/Nodes: Thyroid gland is unremarkable. Esophagus is decompressed. No significant lymphadenopathy. Lungs/Pleura: Small left pleural effusion with  basilar atelectasis or consolidation. Right lung is clear. No pneumothoraces. Upper Abdomen: Surgical absence of the gallbladder. Cyst in the right kidney measuring 7.1 cm diameter. No change since prior study. No imaging follow-up is indicated. Musculoskeletal: Multiple acute left rib fractures involving the posterior left sixth, seventh, eighth, ninth, tenth, and eleventh ribs with severe displacement of the seventh, eighth, ninth, and tenth ribs. Left chest wall stranding and hematoma adjacent to the rib fractures with focal collection/hematoma measuring about 4 x 9.7 cm. IMPRESSION: 1. Multiple acute left rib fractures, some with severe displacement. 2. Left pleural effusion with consolidation or atelectasis in the left lung base, possibly pulmonary contusion. No pneumothorax. Associated left chest wall hematomas. 3. Aortic atherosclerosis. Electronically Signed   By: Burman Nieves M.D.   On: 07/03/2023 18:48   CT ABDOMEN PELVIS W CONTRAST  Result Date: 07/03/2023 CLINICAL DATA:  Lightheadedness, syncope, fell, abdominal pain EXAM: CT ABDOMEN AND PELVIS WITH CONTRAST TECHNIQUE: Multidetector CT imaging of the abdomen and pelvis was performed using the standard protocol following bolus administration of intravenous contrast. RADIATION DOSE REDUCTION: This exam was performed according to the departmental dose-optimization program which includes automated exposure control, adjustment of the mA and/or kV according to patient size and/or use of iterative reconstruction technique. CONTRAST:  OMNIPAQUE IOHEXOL 300 MG/ML  SOLN COMPARISON:  07/01/2019. 06/28/2023 report only, images unavailable. FINDINGS: Lower chest: There is a small left hemothorax, with blood products dependent within the left posterior costophrenic angle. There are multiple displaced there are displaced left posterior seventh, eighth, and ninth rib fractures. The left ninth rib fracture is likely segmental, with dissociation of the left  rib anteriorly from the costochondral junction. This results and left lower lateral thoracic hernia containing portions of the left long and left upper quadrant abdominal contents. Diffuse subcutaneous edema within the left posterolateral chest wall and left upper abdominal wall consistent with contusion and direct trauma. Hepatobiliary: Prior cholecystectomy. No acute liver injury. No biliary duct dilation. Pancreas: Unremarkable. No pancreatic ductal dilatation or surrounding inflammatory changes. Spleen: No splenic injury or perisplenic hematoma. Adrenals/Urinary Tract: No evidence of acute renal injury. Multiple bilateral simple appearing renal cysts do not require specific imaging follow-up. 0.9 cm exophytic hyperdensity off the ventral aspect left kidney image 48/2 measures 73 HU, indeterminate. Punctate 2 mm nonobstructing calculi upper pole left kidney. No right-sided calculi. No  obstructive uropathy within either kidney. The adrenals and bladder are unremarkable. Stomach/Bowel: No bowel obstruction or ileus. Moderate stool throughout the colon consistent with constipation. No bowel wall thickening or inflammatory change. Vascular/Lymphatic: Aortic atherosclerosis. No enlarged abdominal or pelvic lymph nodes. Reproductive: Prostate is enlarged measuring 5.4 x 4.7 cm. Other: No free intraperitoneal fluid or free gas. Herniation of the left lower chest and left upper quadrant abdomen through the left thoracic cage defect created by the segmental left ninth rib fracture as described above. No other abdominal wall hernia. Musculoskeletal: Left posterior seventh, eighth, and ninth rib fractures as above. The ninth rib fracture is segmental, with dissociation of the left ninth costochondral junction. There is herniation of the left lower lobe and left upper quadrant abdominal contents through the defect created by the segmental left ninth rib fracture. Overlying subcutaneous edema and subcutaneous hematoma within  the left lateral chest wall and left upper quadrant abdominal wall consistent with direct trauma. No other acute displaced fractures. Reconstructed images demonstrate no additional findings. IMPRESSION: 1. Left seventh through ninth rib fractures as above, with segmental ninth rib fracture creating a left lower thoracic cage defect with subsequent herniation of the left lower lobe and left upper quadrant abdominal contents through the defect as described above. 2. Extensive left chest wall and left upper quadrant abdominal wall subcutaneous fat stranding and subcutaneous hematoma consistent with direct trauma and contusion. 3. Small left hemothorax.  No evidence of pneumothorax. 4. Punctate nonobstructing left renal calculi. 5. Indeterminate subcentimeter hyperdense lesion ventral aspect left kidney, too small to characterize. This could reflect a hyperdense cyst, and follow-up nonemergent ultrasound may be useful to exclude solid lesion. Other bilateral simple renal cysts do not require specific imaging follow-up. 6.  Aortic Atherosclerosis (ICD10-I70.0). 7. Enlarged prostate. Electronically Signed   By: Sharlet Salina M.D.   On: 07/03/2023 16:54   CT Head Wo Contrast  Result Date: 07/03/2023 CLINICAL DATA:  Fall with head/face strike. EXAM: CT HEAD WITHOUT CONTRAST CT MAXILLOFACIAL WITHOUT CONTRAST CT CERVICAL SPINE WITHOUT CONTRAST TECHNIQUE: Multidetector CT imaging of the head, cervical spine, and maxillofacial structures were performed using the standard protocol without intravenous contrast. Multiplanar CT image reconstructions of the cervical spine and maxillofacial structures were also generated. RADIATION DOSE REDUCTION: This exam was performed according to the departmental dose-optimization program which includes automated exposure control, adjustment of the mA and/or kV according to patient size and/or use of iterative reconstruction technique. COMPARISON:  None Available. FINDINGS: CT HEAD FINDINGS  Brain: No acute intracranial hemorrhage. Gray-white differentiation is preserved. No hydrocephalus or midline shift. Left middle cranial fossa arachnoid cyst with mild mass effect on the left anterior temporal lobe. Vascular: No hyperdense vessel or unexpected calcification. Skull: No calvarial fracture or suspicious bone lesion. Skull base is unremarkable. Other: None. CT MAXILLOFACIAL FINDINGS Osseous: No fracture or mandibular dislocation. No destructive process. Orbits: Negative. No traumatic or inflammatory finding. Sinuses: Clear. Soft tissues: Unremarkable. CT CERVICAL SPINE FINDINGS Alignment: Normal. Skull base and vertebrae: No acute fracture. Normal craniocervical junction. No suspicious bone lesions. Soft tissues and spinal canal: No prevertebral fluid or swelling. No visible canal hematoma. Disc levels: Mild cervical spondylosis without high-grade spinal canal stenosis. Upper chest: No acute findings. Other: None. IMPRESSION: 1. No acute intracranial abnormality. 2. No acute facial bone fracture. 3. No acute fracture or traumatic listhesis of the cervical spine. 4. Left middle cranial fossa arachnoid cyst with mild mass effect on the left anterior temporal lobe. Electronically Signed   By: Zollie Beckers  Wiggins M.D.   On: 07/03/2023 16:48   CT Maxillofacial W Contrast  Result Date: 07/03/2023 CLINICAL DATA:  Fall with head/face strike. EXAM: CT HEAD WITHOUT CONTRAST CT MAXILLOFACIAL WITHOUT CONTRAST CT CERVICAL SPINE WITHOUT CONTRAST TECHNIQUE: Multidetector CT imaging of the head, cervical spine, and maxillofacial structures were performed using the standard protocol without intravenous contrast. Multiplanar CT image reconstructions of the cervical spine and maxillofacial structures were also generated. RADIATION DOSE REDUCTION: This exam was performed according to the departmental dose-optimization program which includes automated exposure control, adjustment of the mA and/or kV according to patient  size and/or use of iterative reconstruction technique. COMPARISON:  None Available. FINDINGS: CT HEAD FINDINGS Brain: No acute intracranial hemorrhage. Gray-white differentiation is preserved. No hydrocephalus or midline shift. Left middle cranial fossa arachnoid cyst with mild mass effect on the left anterior temporal lobe. Vascular: No hyperdense vessel or unexpected calcification. Skull: No calvarial fracture or suspicious bone lesion. Skull base is unremarkable. Other: None. CT MAXILLOFACIAL FINDINGS Osseous: No fracture or mandibular dislocation. No destructive process. Orbits: Negative. No traumatic or inflammatory finding. Sinuses: Clear. Soft tissues: Unremarkable. CT CERVICAL SPINE FINDINGS Alignment: Normal. Skull base and vertebrae: No acute fracture. Normal craniocervical junction. No suspicious bone lesions. Soft tissues and spinal canal: No prevertebral fluid or swelling. No visible canal hematoma. Disc levels: Mild cervical spondylosis without high-grade spinal canal stenosis. Upper chest: No acute findings. Other: None. IMPRESSION: 1. No acute intracranial abnormality. 2. No acute facial bone fracture. 3. No acute fracture or traumatic listhesis of the cervical spine. 4. Left middle cranial fossa arachnoid cyst with mild mass effect on the left anterior temporal lobe. Electronically Signed   By: Orvan Falconer M.D.   On: 07/03/2023 16:48   CT Cervical Spine Wo Contrast  Result Date: 07/03/2023 CLINICAL DATA:  Fall with head/face strike. EXAM: CT HEAD WITHOUT CONTRAST CT MAXILLOFACIAL WITHOUT CONTRAST CT CERVICAL SPINE WITHOUT CONTRAST TECHNIQUE: Multidetector CT imaging of the head, cervical spine, and maxillofacial structures were performed using the standard protocol without intravenous contrast. Multiplanar CT image reconstructions of the cervical spine and maxillofacial structures were also generated. RADIATION DOSE REDUCTION: This exam was performed according to the departmental  dose-optimization program which includes automated exposure control, adjustment of the mA and/or kV according to patient size and/or use of iterative reconstruction technique. COMPARISON:  None Available. FINDINGS: CT HEAD FINDINGS Brain: No acute intracranial hemorrhage. Gray-white differentiation is preserved. No hydrocephalus or midline shift. Left middle cranial fossa arachnoid cyst with mild mass effect on the left anterior temporal lobe. Vascular: No hyperdense vessel or unexpected calcification. Skull: No calvarial fracture or suspicious bone lesion. Skull base is unremarkable. Other: None. CT MAXILLOFACIAL FINDINGS Osseous: No fracture or mandibular dislocation. No destructive process. Orbits: Negative. No traumatic or inflammatory finding. Sinuses: Clear. Soft tissues: Unremarkable. CT CERVICAL SPINE FINDINGS Alignment: Normal. Skull base and vertebrae: No acute fracture. Normal craniocervical junction. No suspicious bone lesions. Soft tissues and spinal canal: No prevertebral fluid or swelling. No visible canal hematoma. Disc levels: Mild cervical spondylosis without high-grade spinal canal stenosis. Upper chest: No acute findings. Other: None. IMPRESSION: 1. No acute intracranial abnormality. 2. No acute facial bone fracture. 3. No acute fracture or traumatic listhesis of the cervical spine. 4. Left middle cranial fossa arachnoid cyst with mild mass effect on the left anterior temporal lobe. Electronically Signed   By: Orvan Falconer M.D.   On: 07/03/2023 16:48   DG Chest Port 1 View  Result Date: 07/03/2023 CLINICAL DATA:  Sepsis, cough and congestion. EXAM: PORTABLE CHEST 1 VIEW COMPARISON:  04/15/2016 FINDINGS: The heart size and mediastinal contours are within normal limits. Lung volumes are very low bilaterally. There is no evidence of pulmonary edema, consolidation, pneumothorax or pleural fluid. The visualized skeletal structures are unremarkable. IMPRESSION: Low lung volumes. Electronically  Signed   By: Irish Lack M.D.   On: 07/03/2023 13:08     I have independently reviewed the above radiologic studies and discussed with the patient   Recent Lab Findings: Lab Results  Component Value Date   WBC 18.7 (H) 07/04/2023   HGB 10.2 (L) 07/04/2023   HCT 31.3 (L) 07/04/2023   PLT 394 07/04/2023   GLUCOSE 305 (H) 07/04/2023   CHOL 213 (H) 06/28/2012   TRIG 227 (H) 06/28/2012   HDL 35 (L) 06/28/2012   LDLCALC 133 (H) 06/28/2012   ALT 28 07/03/2023   AST 20 07/03/2023   NA 133 (L) 07/04/2023   K 4.0 07/04/2023   CL 101 07/04/2023   CREATININE 1.03 07/04/2023   BUN 15 07/04/2023   CO2 24 07/04/2023   TSH 1.288 03/13/2023   INR 1.1 07/03/2023   HGBA1C 5.4 02/17/2016  Assessment / Plan:   Subacute presentation of left rib fractures with hematoma of left lower chest and abdominal wall, requiring nasal cannula 2-4 L/min.  As discussed with Dr. Donata Clay, patient is not a surgical candidate secondary to multiple comorbidities. 2. History of DM-HGA1C 7.6. On Metformin prior to surgery 3. History of hypertension-on Lisinopril prior to admission 4. History of depression-on Lexapro 5. History of hyperlipidemia-on Lipitor prior to admission   I  spent 20 minutes counseling the patient face to face.   Doree Fudge PA-C 07/05/2023 9:54 AM  CT surgery  We are asked to see this very nice patient about some left lower rib fractures which probably occurred about 2 weeks ago according to his history.  He only recently presented to the ED after a fall when a CT scan of the chest demonstrated left lower rib fractures, 2 of which had mild to moderate displacement, as well as a localized small pleural hematoma/effusion.  Because of the subacute presentation of the rib fractures, his advanced age and morbid obesity, and the fact that his pulmonary status has remained stable needing only nasal cannula 2 L, his  benefit from open surgery and rib plating would be minimal if any.   Recommend continued conservative management, chest physiotherapy, progressive physical therapy.  Lovett Sox MD

## 2023-07-05 NOTE — Progress Notes (Signed)
Pt. States he will notify when he wants to wear his cpap.

## 2023-07-05 NOTE — Anesthesia Preprocedure Evaluation (Signed)
Anesthesia Evaluation  Patient identified by MRN, date of birth, ID band Patient awake    Reviewed: Allergy & Precautions, NPO status , Patient's Chart, lab work & pertinent test results  Airway Mallampati: III  TM Distance: >3 FB Neck ROM: Full    Dental  (+) Dental Advisory Given, Chipped, Caps   Pulmonary sleep apnea and Continuous Positive Airway Pressure Ventilation , former smoker L rib fx 6-11 with flail segment    + decreased breath sounds      Cardiovascular hypertension, Normal cardiovascular exam Rhythm:Regular Rate:Normal     Neuro/Psych  PSYCHIATRIC DISORDERS Anxiety Depression    negative neurological ROS     GI/Hepatic negative GI ROS, Neg liver ROS,,,  Endo/Other  diabetes  Obesity   Renal/GU negative Renal ROS     Musculoskeletal negative musculoskeletal ROS (+)    Abdominal   Peds  Hematology  (+) Blood dyscrasia, anemia Plt 394k   Anesthesia Other Findings Day of surgery medications reviewed with the patient.  Reproductive/Obstetrics                             Anesthesia Physical Anesthesia Plan  ASA: 3  Anesthesia Plan:    Post-op Pain Management: Regional block*   Induction:   PONV Risk Score and Plan: 1 and Treatment may vary due to age or medical condition  Airway Management Planned: Natural Airway  Additional Equipment:   Intra-op Plan:   Post-operative Plan:   Informed Consent: I have reviewed the patients History and Physical, chart, labs and discussed the procedure including the risks, benefits and alternatives for the proposed anesthesia with the patient or authorized representative who has indicated his/her understanding and acceptance.     Dental advisory given  Plan Discussed with: CRNA  Anesthesia Plan Comments:        Anesthesia Quick Evaluation

## 2023-07-05 NOTE — Progress Notes (Signed)
Physical Therapy Treatment Patient Details Name: Mario Knapp MRN: [id number] DOB: 01/14/1946 Today's Date: 07/05/2023   History of Present Illness Pt is 77 yo male who presents on 07/03/23 after a syncopal episode at home. Pt with 2 week hx of L sided chest pain, on abx from urgent care. Found with L fib fx 6-11 with flail segment. PMH includes: arthritis, depression, HTN, sleep apnea.    PT Comments  Pt was limited in progressing his OOB mobility independence due to him becoming increasingly lightheaded the longer he stood. His BP remained stable but his SpO2 dropped to as low 87% (quickly rebounded to 90s% with rest) and his HR increased up to 132. Noted improved/slower progression of lightheadedness when pt would control his breathing, breathing in through nose and out through mouth. Yet, he remained unsafe to mobilize far from EOB today due to the extent of his lightheadedness. He would sway and had x1 LOB when he would become lightheaded, needing minA to recover when performing standing mobility without UE support. Recommending a rollator at d/c for energy conservation and balance purposes as he is at risk for falls. Will continue to follow acutely.   If plan is discharge home, recommend the following: Help with stairs or ramp for entrance;Assist for transportation;Assistance with cooking/housework;A little help with walking and/or transfers;A little help with bathing/dressing/bathroom   Can travel by private Psychologist, clinical (4 wheels) (bariatric)    Recommendations for Other Services       Precautions / Restrictions Precautions Precautions: Fall Precaution Comments: watch SpO2 & HR Restrictions Weight Bearing Restrictions: No     Mobility  Bed Mobility Overal bed mobility: Needs Assistance Bed Mobility: Sit to Supine       Sit to supine: Supervision, Used rails   General bed mobility comments: Supervision for safety and line  management. Pt needing extra time to adjust self midline in bed due to pain    Transfers Overall transfer level: Needs assistance Equipment used: None Transfers: Sit to/from Stand, Bed to chair/wheelchair/BSC Sit to Stand: Contact guard assist   Step pivot transfers: Contact guard assist       General transfer comment: CGA for safety transferring to stand 1x from bedside commode and 1x from EOB, mild sway but no LOB. CGA to step pivot BSC to EOB. Sway noted when pt felt lightheaded with prolonged standing mobility, thus cued pt to sit    Ambulation/Gait Ambulation/Gait assistance: Min assist Gait Distance (Feet): 2 Feet Assistive device: None Gait Pattern/deviations: Step-through pattern, Decreased stride length Gait velocity: decreased Gait velocity interpretation: <1.8 ft/sec, indicate of risk for recurrent falls   General Gait Details: Pt took a couple steps BSC to EOB without LOB at CGA level with noted mild sway. His swaying increased in intensity as the time in standing and his lightheadedness progressed, x1 LOB needing minA to recover when marching at EOB. Pt cued to sit as his lightheadedness worsened for his safety   Stairs             Wheelchair Mobility     Tilt Bed    Modified Rankin (Stroke Patients Only)       Balance Overall balance assessment: Mild deficits observed, not formally tested  Cognition Arousal: Alert Behavior During Therapy: WFL for tasks assessed/performed Overall Cognitive Status: Within Functional Limits for tasks assessed                                          Exercises General Exercises - Lower Extremity Long Arc Quad: AROM, Both, 15 reps, Seated Hip Flexion/Marching: AROM, Both, 10 reps, Standing (without UE support)    General Comments General comments (skin integrity, edema, etc.): SpO2 >/= 87% on [place]; HR up to 132; BP stable throughout; noted  improved/slower progression of lightheadedness when pt would control his breathing in through nose and out through mouth      Pertinent Vitals/Pain Pain Assessment Pain Assessment: Faces Faces Pain Scale: Hurts little more Pain Location: abdomen, L side Pain Descriptors / Indicators: Discomfort, Grimacing, Guarding Pain Intervention(s): Monitored during session, Limited activity within patient's tolerance, Repositioned    Home Living                          Prior Function            PT Goals (current goals can now be found in the care plan section) Acute Rehab PT Goals Patient Stated Goal: return home ASAP PT Goal Formulation: With patient/family Time For Goal Achievement: 07/18/23 Potential to Achieve Goals: Good Progress towards PT goals: Progressing toward goals    Frequency    Min 1X/week      PT Plan      Co-evaluation              AM-PAC PT "6 Clicks" Mobility   Outcome Measure  Help needed turning from your back to your side while in a flat bed without using bedrails?: None Help needed moving from lying on your back to sitting on the side of a flat bed without using bedrails?: None Help needed moving to and from a bed to a chair (including a wheelchair)?: A Little Help needed standing up from a chair using your arms (e.g., wheelchair or bedside chair)?: A Little Help needed to walk in hospital room?: Total (<20 ft today) Help needed climbing 3-5 steps with a railing? : Total 6 Click Score: 16    End of Session Equipment Utilized During Treatment: Oxygen Activity Tolerance: Patient tolerated treatment well;Patient limited by fatigue;Other (comment) (limited by lightheadedness) Patient left: with call bell/phone within reach;with family/visitor present;in bed;with bed alarm set Nurse Communication: Mobility status;Other (comment) (BP) PT Visit Diagnosis: Unsteadiness on feet (R26.81);Difficulty in walking, not elsewhere classified  (R26.2);Pain;Other abnormalities of gait and mobility (R26.89);Dizziness and giddiness (R42)     Time: 1610-9604 PT Time Calculation (min) (ACUTE ONLY): 17 min  Charges:    $Therapeutic Activity: 8-22 mins PT General Charges $$ ACUTE PT VISIT: 1 Visit                     [name], PT, DPT Acute Rehabilitation Services  Office: [phone]    [name] 07/05/2023, 4:57 PM

## 2023-07-06 ENCOUNTER — Inpatient Hospital Stay (HOSPITAL_COMMUNITY): Payer: PPO | Admitting: Anesthesiology

## 2023-07-06 ENCOUNTER — Inpatient Hospital Stay (HOSPITAL_COMMUNITY): Payer: PPO

## 2023-07-06 DIAGNOSIS — R55 Syncope and collapse: Secondary | ICD-10-CM

## 2023-07-06 DIAGNOSIS — S2242XA Multiple fractures of ribs, left side, initial encounter for closed fracture: Secondary | ICD-10-CM | POA: Diagnosis not present

## 2023-07-06 DIAGNOSIS — G8918 Other acute postprocedural pain: Secondary | ICD-10-CM | POA: Diagnosis not present

## 2023-07-06 LAB — GLUCOSE, CAPILLARY
Glucose-Capillary: 174 mg/dL — ABNORMAL HIGH (ref 70–99)
Glucose-Capillary: 200 mg/dL — ABNORMAL HIGH (ref 70–99)
Glucose-Capillary: 218 mg/dL — ABNORMAL HIGH (ref 70–99)
Glucose-Capillary: 243 mg/dL — ABNORMAL HIGH (ref 70–99)
Glucose-Capillary: 280 mg/dL — ABNORMAL HIGH (ref 70–99)
Glucose-Capillary: 344 mg/dL — ABNORMAL HIGH (ref 70–99)

## 2023-07-06 LAB — POCT I-STAT 7, (LYTES, BLD GAS, ICA,H+H)
Acid-Base Excess: 4 mmol/L — ABNORMAL HIGH (ref 0.0–2.0)
Bicarbonate: 29.7 mmol/L — ABNORMAL HIGH (ref 20.0–28.0)
Calcium, Ion: 1.15 mmol/L (ref 1.15–1.40)
HCT: 30 % — ABNORMAL LOW (ref 39.0–52.0)
Hemoglobin: 10.2 g/dL — ABNORMAL LOW (ref 13.0–17.0)
O2 Saturation: 58 %
Potassium: 4.4 mmol/L (ref 3.5–5.1)
Sodium: 131 mmol/L — ABNORMAL LOW (ref 135–145)
TCO2: 31 mmol/L (ref 22–32)
pCO2 arterial: 50.7 mmHg — ABNORMAL HIGH (ref 32–48)
pH, Arterial: 7.376 (ref 7.35–7.45)
pO2, Arterial: 32 mmHg — CL (ref 83–108)

## 2023-07-06 MED ORDER — LISINOPRIL 5 MG PO TABS
5.0000 mg | ORAL_TABLET | Freq: Every day | ORAL | Status: DC
  Administered 2023-07-06 – 2023-07-14 (×8): 5 mg via ORAL
  Filled 2023-07-06 (×9): qty 1

## 2023-07-06 MED ORDER — METOCLOPRAMIDE HCL 5 MG/ML IJ SOLN: 10.0000 mg | Freq: Four times a day (QID) | INTRAMUSCULAR | Status: DC

## 2023-07-06 MED ORDER — LIDOCAINE-EPINEPHRINE (PF) 1.5 %-1:200000 IJ SOLN
INTRAMUSCULAR | Status: DC | PRN
  Administered 2023-07-06: 5 mL via EPIDURAL

## 2023-07-06 MED ORDER — METOCLOPRAMIDE HCL 5 MG/ML IJ SOLN
10.0000 mg | Freq: Four times a day (QID) | INTRAMUSCULAR | Status: DC
  Administered 2023-07-06 – 2023-07-13 (×27): 10 mg via INTRAVENOUS
  Filled 2023-07-06 (×27): qty 2

## 2023-07-06 MED ORDER — ROPIVACAINE HCL 5 MG/ML IJ SOLN
INTRAMUSCULAR | Status: DC | PRN
Start: 2023-07-06 — End: 2023-07-06
  Administered 2023-07-06 (×4): 5 mL via EPIDURAL

## 2023-07-06 MED ORDER — INSULIN GLARGINE-YFGN 100 UNIT/ML ~~LOC~~ SOLN
10.0000 [IU] | Freq: Every day | SUBCUTANEOUS | Status: DC
  Administered 2023-07-07 – 2023-07-14 (×8): 10 [IU] via SUBCUTANEOUS
  Filled 2023-07-06 (×9): qty 0.1

## 2023-07-06 MED ORDER — SORBITOL 70 % SOLN
60.0000 mL | Freq: Once | Status: AC
  Administered 2023-07-06: 60 mL via ORAL
  Filled 2023-07-06: qty 60

## 2023-07-06 MED ORDER — FINASTERIDE 5 MG PO TABS
5.0000 mg | ORAL_TABLET | Freq: Every day | ORAL | Status: DC
  Administered 2023-07-06 – 2023-07-15 (×9): 5 mg via ORAL
  Filled 2023-07-06 (×10): qty 1

## 2023-07-06 MED ORDER — MAGNESIUM CITRATE PO SOLN
1.0000 | Freq: Once | ORAL | Status: AC
  Administered 2023-07-06: 1 via ORAL
  Filled 2023-07-06: qty 296

## 2023-07-06 MED ORDER — LACTATED RINGERS IV SOLN: INTRAVENOUS | Status: DC

## 2023-07-06 MED ORDER — HEPARIN SODIUM (PORCINE) 5000 UNIT/ML IJ SOLN
5000.0000 [IU] | Freq: Three times a day (TID) | INTRAMUSCULAR | Status: DC
  Administered 2023-07-06 – 2023-07-11 (×15): 5000 [IU] via SUBCUTANEOUS
  Filled 2023-07-06 (×15): qty 1

## 2023-07-06 MED ORDER — POTASSIUM CHLORIDE CRYS ER 20 MEQ PO TBCR
20.0000 meq | EXTENDED_RELEASE_TABLET | Freq: Every day | ORAL | Status: DC
  Administered 2023-07-06 – 2023-07-07 (×2): 20 meq via ORAL
  Filled 2023-07-06 (×2): qty 1

## 2023-07-06 NOTE — Anesthesia Post-op Follow-up Note (Signed)
  Anesthesia Pain Follow-up Note  Patient: Mario Knapp  Day #: 2  Date of Follow-up: 07/06/2023 Time: 11:21 PM  Last Vitals:  Vitals:   07/06/23 1900 07/06/23 2000  BP: (!) 124/108 (!) 142/105  Pulse: (!) 103 (!) 104  Resp: 20 (!) 21  Temp:  36.8 C  SpO2: 94% 95%    Level of Consciousness: alert  Pain: moderate   Side Effects:None  Catheter Site Exam:clean, dry, no drainage  Anti-Coag Meds (From admission, onward)    Start     Dose/Rate Route Frequency Ordered Stop   07/06/23 1400  heparin injection 5,000 Units        5,000 Units Subcutaneous Every 8 hours 07/06/23 1306        Epidural / Intrathecal (From admission, onward)    Start     Dose/Rate Route Frequency Ordered Stop   07/05/23 1145  ropivacaine (PF) 2 mg/mL (0.2%) (NAROPIN) injection        10 mL/hr 10 mL/hr  Epidural Continuous 07/05/23 1050          Plan: Epidural not working well, Catheter removed/tip intact at surgeon's request, replaced   Embrie Mikkelsen,E. Catalaya Garr

## 2023-07-06 NOTE — Progress Notes (Addendum)
  Subjective: Had more L chest wall pain early am- epidural analgesic dose adjusted CXR this am with low lung volumes, congestion. O2  @ 2.5 L [place] Plan  sorbitol to help empty colon, po lasix for fluid PT has found patient unsteady on feet- at risk for fall.   May need rehab before returning home Objective: Vital signs in last 24 hours: Temp:  [97.4 F (36.3 C)-98.3 F (36.8 C)] 97.8 F (36.6 C) (08/29 0800) Pulse Rate:  [93-137] 109 (08/29 1000) Cardiac Rhythm: Sinus tachycardia;Normal sinus rhythm (08/28 1200) Resp:  [16-42] 24 (08/29 1000) BP: (96-176)/(53-92) 135/81 (08/29 1000) SpO2:  [91 %-99 %] 95 % (08/29 1000)  Hemodynamic parameters for last 24 hours:  Nsr afebrile  Intake/Output from previous day: 08/28 0701 - 08/29 0700 In: 198.2  Out: 400 [Urine:400] Intake/Output this shift: Total I/O In: 30 [Other:30] Out: -   EXAM Sitting in recliner Ecchymoses L chest w/o change Decreased breath sounds L base Abd soft, distended Lab Results: Recent Labs    07/03/23 1731 07/04/23 0557  WBC 19.6* 18.7*  HGB 11.1* 10.2*  HCT 34.1* 31.3*  PLT 375 394   BMET:  Recent Labs    07/03/23 1150 07/04/23 0557  NA 131* 133*  K 3.9 4.0  CL 98 101  CO2 23 24  GLUCOSE 259* 305*  BUN 21 15  CREATININE 0.89 1.03  CALCIUM 8.0* 8.4*    PT/INR:  Recent Labs    07/03/23 1150  LABPROT 14.8  INR 1.1   ABG No results found for: "PHART", "HCO3", "TCO2", "ACIDBASEDEF", "O2SAT" CBG (last 3)  Recent Labs    07/05/23 2340 07/06/23 0349 07/06/23 0713  GLUCAP 211* 174* 200*    Assessment/Plan: S/P  Patient is over 2 weeks following blunt left chest trauma from probable fall with lower rib fractures and chest wall hematoma.  He has a history of morbid obesity, sleep apnea, deconditioning, and is not candidate for thoracotomy and rib plating.  Plan Laxatives, pain control, PT and gentle diuresis today. Repeat CXR tomorrow to follow pleural effusion Patient has L  brain arachnoid cyst with compression of grey matter, recent falls.  - consider neuro consult  LOS: 2 days    Lovett Sox 07/06/2023

## 2023-07-06 NOTE — Progress Notes (Signed)
VASCULAR LAB    Carotid duplex has been performed.  See CV proc for preliminary results.   Ginnie Marich, RVT 07/06/2023, 10:33 AM

## 2023-07-06 NOTE — Progress Notes (Signed)
Air bubble noted in epidural line. Anesthesia made aware. No interventions at this time. Anesthesia will round later today.

## 2023-07-06 NOTE — Anesthesia Procedure Notes (Signed)
Epidural Patient location during procedure: post-op Start time: 07/06/2023 10:47 PM End time: 07/06/2023 11:05 AM  Staffing Anesthesiologist: Jairo Ben, MD Performed: anesthesiologist   Preanesthetic Checklist Completed: patient identified, IV checked, risks and benefits discussed, surgical consent, monitors and equipment checked, pre-op evaluation and timeout performed  Epidural Patient position: sitting Prep: DuraPrep and site prepped and draped Patient monitoring: heart rate, cardiac monitor, continuous pulse ox and blood pressure Approach: midline Location: thoracic (1-12) (T10) Injection technique: LOR air  Needle:  Needle type: Tuohy  Needle gauge: 17 G Needle length: 9 cm Needle insertion depth: 5 cm Catheter size: 19 Gauge Catheter at skin depth: 11 cm Test dose: 1.5% lidocaine with Epi 1:200 K  Assessment Sensory level: T3  Additional Notes Pt identified in PACU room.  Monitors applied. Working IV access confirmed. Sterile prep, drape Thoracic spine.  1% lido local T10.  #17ga Touhy LOR air at 5, cath in to 11cm skin (second pass), Test dose OK, cath dosed and infusion begun.  Patient asymptomatic, VSS, no heme aspirated, tolerated well. Pt reports feeling much better, T3 level to cold.  Sandford Craze, MD Reason for block:at surgeon's request and procedure for pain

## 2023-07-06 NOTE — Progress Notes (Signed)
Patient ID: Mario Knapp, male   DOB: September 25, 1946, 77 y.o.   MRN: [id number] Appreciate Anesthesia F/U. Epidural not working well - they may need to replace. Check ABG and try BiPAP. Add Duonebs PRN. I also updated his wife at the bedside.  Violeta Gelinas, MD, MPH, FACS Please use [email or web address] to contact on call provider

## 2023-07-06 NOTE — Progress Notes (Signed)
Patient ID: Mario Knapp, male   DOB: 1946-04-09, 77 y.o.   MRN: [id number] Reports he is feeling somewhat better. Did BiPAP for about and took it off. He reports he will try it again tonight. Anesthesia plans to replace the epidural tonight. He also wants to update his code status - he will accept resuscitation but does not want intubation. His wife is present and was part of the discussion. I will update the code status.  Violeta Gelinas, MD, MPH, FACS Please use [email or web address] to contact on call provider

## 2023-07-06 NOTE — Inpatient Diabetes Management (Signed)
Inpatient Diabetes Program Recommendations  AACE/ADA: New Consensus Statement on Inpatient Glycemic Control (2015)  Target Ranges:  Prepandial:   less than 140 mg/dL      Peak postprandial:   less than 180 mg/dL (1-2 hours)      Critically ill patients:  140 - 180 mg/dL   Lab Results  Component Value Date   GLUCAP 218 (H) 07/06/2023   HGBA1C 5.4 02/17/2016    Review of Glycemic Control  Latest Reference Range & Units 07/05/23 07:24 07/05/23 11:54 07/05/23 14:59 07/05/23 19:53 07/05/23 23:40 07/06/23 03:49 07/06/23 07:13 07/06/23 10:59  Glucose-Capillary 70 - 99 mg/dL 564 (H) 332 (H) 951 (H) 318 (H) 211 (H) 174 (H) 200 (H) 218 (H)   Diabetes history: DM 2 Outpatient Diabetes medications: metformin 500 mg Daily Current orders for Inpatient glycemic control:  Novolog 0-20 units Q4 hours  Inpatient Diabetes Program Recommendations:    -   Start Semglee 10 units -   Add Novolog 3 units tid meal coverage if eating >50% of meals  Thanks,  Christena Deem RN, MSN, BC-ADM Inpatient Diabetes Coordinator Team Pager [phone] (8a-5p)

## 2023-07-06 NOTE — Progress Notes (Signed)
Pt taken to PACU with 2 Rns for epidural replacement

## 2023-07-06 NOTE — Progress Notes (Signed)
Pt still having abdominal distention and pain. Pt with small mucus bowel movement despite two rounds of bowel prep and additional meds. Not c/o no appetite, pain, and fatigue. MD made aware. No new orders at this time.

## 2023-07-06 NOTE — Progress Notes (Signed)
PT Cancellation Note  Patient Details Name: Mario Knapp MRN: [id number] DOB: November 11, 1945   Cancelled Treatment:    Reason Eval/Treat Not Completed: (P) Patient declined, no reason specified. Pt requesting hold on PT today due to not feeling well and having low energy. Pt noted to have increased work of breathing at rest today compared to yesterday. RN aware. Pt still unable to have BM either. Pt agreeable to work with PT tomorrow.   [name], PT, DPT Acute Rehabilitation Services  Office: [phone]    [name] 07/06/2023, 3:55 PM

## 2023-07-06 NOTE — Progress Notes (Signed)
TRNA and Anaesthesia called after pt had a coughing fit that caused a "popping" sensation in the patients left side of chest resulting in unmatched pain from previous in the night; pts pain was adequately controlled until approx 4 am. Pt given a PRN dose of oxy. Anaesthesia at bedside to give a bolus through pts epidural and to check placement of epidural. Per anaesthesia epidural still in place; TRNA verified X ray on way up for morning scan d/t pt having decreased breath sound the sound worse than beginning of shift. Pt and family made aware of plan

## 2023-07-06 NOTE — Anesthesia Preprocedure Evaluation (Addendum)
Anesthesia Evaluation  Patient identified by MRN, date of birth, ID band Patient awake    Reviewed: Allergy & Precautions, NPO status , Patient's Chart, lab work & pertinent test results  History of Anesthesia Complications Negative for: history of anesthetic complications  Airway Mallampati: I  TM Distance: >3 FB Neck ROM: Full    Dental  (+) Edentulous Upper, Edentulous Lower   Pulmonary COPD,  COPD inhaler, former smoker   breath sounds clear to auscultation       Cardiovascular hypertension, Pt. on medications (-) angina  Rhythm:Regular Rate:Tachycardia  07/04/2023 ECHO: EF 65 to 70%.  1. The LV has normal function, no regional wall motion abnormalities. There is moderate LVH. Grade I diastolic dysfunction (impaired  relaxation).   2. RVF is hyperdynamic. The right ventricular size is normal.   3. Left atrial size was mildly dilated.   4. The mitral valve was not well visualized. No evidence of mitral valve regurgitation.   5. The aortic valve was not well visualized. Aortic valve regurgitation is mild.     Neuro/Psych   Anxiety Depression    syncope    GI/Hepatic Neg liver ROS,GERD  Medicated,,  Endo/Other  diabetes, Oral Hypoglycemic Agents    Renal/GU negative Renal ROS     Musculoskeletal   Abdominal  (+) + obese  Peds  Hematology Sq heparin 5000u @1500  today   Anesthesia Other Findings  07/03/2023 had a syncopal episode and while he was walking out of the bathroom, he felt "funny", he became lightheaded, and had a syncopal episode.  Per patient, he landed face first onto the floor. He had a nosebleed and pain around his nose. He denied chest pain or palpitations prior the incident.  After "coming to", he called 911 and was transported to the [facility] ED on 07/03/2023 for further evaluation. He was found to be hypotensive. His WBC was 18,000 and lactic acid was mildly elevated at 2.3. CXR showed low lung  volumes. CT of chest/abdomen/pelvis showed left rib fractures 7th through 9th with segmental 9th rib fracture creating a left lower thoracic cage defect with subsequent herniation of the left lower lobe and left upper quadrant abdominal contents, extensive left chest wall subcutaneous hematoma, small left hemothorax, and aortic atherosclerosis. CT of the head showed no acute intracranial abnormality and left middle cranial fossa arachnoid cyst with mild mass effect on the left anterior temporal lobe.   Reproductive/Obstetrics                             Anesthesia Physical Anesthesia Plan  ASA: 3  Anesthesia Plan: Epidural   Post-op Pain Management:    Induction:   PONV Risk Score and Plan:   Airway Management Planned:   Additional Equipment:   Intra-op Plan:   Post-operative Plan:   Informed Consent: I have reviewed the patients History and Physical, chart, labs and discussed the procedure including the risks, benefits and alternatives for the proposed anesthesia with the patient or authorized representative who has indicated his/her understanding and acceptance.       Plan Discussed with:   Anesthesia Plan Comments: (Pt had good relief with epidural yesterday, today stopped working, received sq heparin 5000u at 1500 today, asked to replace. Eval, no response to 10cc 5% Ropiv. Replacing epidural >6 hours following last anticoag. )        Anesthesia Quick Evaluation

## 2023-07-06 NOTE — Progress Notes (Addendum)
Patient ID: Mario Knapp, male   DOB: 06/05/1946, 77 y.o.   MRN: [id number] Follow up - Trauma Critical Care   Patient Details:    Mario Knapp is an 77 y.o. male.  Lines/tubes : Epidural Catheter 07/05/23 (Active)  Site Assessment Clean, Dry, Intact 07/06/23 0400  Line Status Infusing 07/06/23 0400  Dressing Type Transparent 07/06/23 0400  Dressing Status Clean, Dry, Intact 07/06/23 0400  Dressing Change Due 07/12/23 07/05/23 2200    Microbiology/Sepsis markers: Results for orders placed or performed during the hospital encounter of 07/04/23  MRSA Next Gen by PCR, Nasal     Status: None   Collection Time: 07/04/23  1:01 AM   Specimen: Nasal Mucosa; Nasal Swab  Result Value Ref Range Status   MRSA by PCR Next Gen NOT DETECTED NOT DETECTED Final    Comment: (NOTE) The GeneXpert MRSA Assay (FDA approved for NASAL specimens only), is one component of a comprehensive MRSA colonization surveillance program. It is not intended to diagnose MRSA infection nor to guide or monitor treatment for MRSA infections. Test performance is not FDA approved in patients less than 63 years old. Performed at [facility], [address] [place], [place] [address]     Anti-infectives:  Anti-infectives (From admission, onward)    None     Consults: Treatment Team:  Lovett Sox, MD    Studies:    Events:  Subjective:    Overnight Issues:  Bad episode of L rib pain about 4am, improved after bolus in epidural Objective:  Vital signs for last 24 hours: Temp:  [97.4 F (36.3 C)-98.3 F (36.8 C)] 97.8 F (36.6 C) (08/29 0800) Pulse Rate:  [93-137] 111 (08/29 0800) Resp:  [16-42] 24 (08/29 0800) BP: (96-176)/(53-92) 156/92 (08/29 0800) SpO2:  [91 %-99 %] 96 % (08/29 0800)  Hemodynamic parameters for last 24 hours:    Intake/Output from previous day: 08/28 0701 - 08/29 0700 In: 198.2  Out: 400 [Urine:400]  Intake/Output this shift: Total I/O In: 10  [Other:10] Out: -   Vent settings for last 24 hours:    Physical Exam:  General: alert Neuro: alert and oriented HEENT/Neck: no JVD Resp: CTA, dec at bases, L lateral chest wall ecchymoses CVS: RRR 108 GI: distended with some tympani, soft, NT Extremities: calves soft  Results for orders placed or performed during the hospital encounter of 07/04/23 (from the past 24 hour(s))  Glucose, capillary     Status: Abnormal   Collection Time: 07/05/23 11:54 AM  Result Value Ref Range   Glucose-Capillary 227 (H) 70 - 99 mg/dL  Type and screen [facility]     Status: None   Collection Time: 07/05/23 12:08 PM  Result Value Ref Range   ABO/RH(D) O NEG    Antibody Screen NEG    Sample Expiration      07/08/2023,2359 Performed at [facility], [address], [place], [place] [address]   Glucose, capillary     Status: Abnormal   Collection Time: 07/05/23  2:59 PM  Result Value Ref Range   Glucose-Capillary 268 (H) 70 - 99 mg/dL  Glucose, capillary     Status: Abnormal   Collection Time: 07/05/23  7:53 PM  Result Value Ref Range   Glucose-Capillary 318 (H) 70 - 99 mg/dL  Glucose, capillary     Status: Abnormal   Collection Time: 07/05/23 11:40 PM  Result Value Ref Range   Glucose-Capillary 211 (H) 70 - 99 mg/dL  Glucose, capillary     Status: Abnormal   Collection Time: 07/06/23  3:49 AM  Result Value Ref Range   Glucose-Capillary 174 (H) 70 - 99 mg/dL  Glucose, capillary     Status: Abnormal   Collection Time: 07/06/23  7:13 AM  Result Value Ref Range   Glucose-Capillary 200 (H) 70 - 99 mg/dL    Assessment & Plan: Present on Admission:  Flail chest    LOS: 2 days   Additional comments:I reviewed the patient's new clinical lab test results. And CXR Syncopal fall   Syncope - echo completed, carotid US pending, EKG noted L rib fx 6-11 with flail segment - aggressive pulm toilet, epidural placed by Anesthesia, needed bolus early this AM but this did  help a lot, noted TCTS does not recommend plating Acute hypoxic respiratory failure - due to above, 3L [place] currently L temporal lobe arachnoid cyst - I discussed with Dr. Conchita Paris - no treatment needed Insomnia/delirium - maintain sleep wake cycle during the day, sleep aid FEN - regular diet, needs to move bowels as abdominal distention pushing up on chest. Got multiple meds x 2 yesterday, some flatus but no BM. Try PO sorbitol now then another bottle of Mg citrate. Watch U/O. Drinking a lot of water. DVT - SCDs, SQH after epidural Dispo - ICU  I spoke with his wife at the bedside Critical Care Total Time*: 37 Minutes  Violeta Gelinas, MD, MPH, FACS Trauma & General Surgery Use [email or web address] to contact on call provider  07/06/2023  *Care during the described time interval was provided by me. I have reviewed this patient's available data, including medical history, events of note, physical examination and test results as part of my evaluation.

## 2023-07-06 NOTE — Anesthesia Post-op Follow-up Note (Signed)
  Anesthesia Pain Follow-up Note  Patient: Mario Knapp  Day #: 2  Date of Follow-up: 07/06/2023 Time: 4:46 PM  Last Vitals:  Vitals:   07/06/23 1400 07/06/23 1500  BP: (!) 144/90 (!) 149/67  Pulse: (!) 108 (!) 107  Resp: (!) 21 19  Temp:    SpO2: 95% 94%    Level of Consciousness:  arousable and answers questions but nods off if not stimulated  Pain:  states it hurts when he's coughing but otherwise manageable. Daughter states he complains of pain all the time when no one is in the room.     Side Effects:None  Catheter Site Exam:clean, no drainage  Anti-Coag Meds (From admission, onward)    Start     Dose/Rate Route Frequency Ordered Stop   07/06/23 1400  heparin injection 5,000 Units        5,000 Units Subcutaneous Every 8 hours 07/06/23 1306        Epidural / Intrathecal (From admission, onward)    Start     Dose/Rate Route Frequency Ordered Stop   07/05/23 1145  ropivacaine (PF) 2 mg/mL (0.2%) (NAROPIN) injection        10 mL/hr 10 mL/hr  Epidural Continuous 07/05/23 1050          Plan: Given increased narcotic use and reports of pain, I checked a level with ice and patient felt cold at all dermatomes. Heparin sq at 1500 which requires a delay of 4-6 hrs for replacement according to ASRA guidelines. Discussed with Dr Janee Morn and recommended an ABG and Bipap in the interim. Bolus dose given with no change in exam. Will need to replace or try a different therapeutic modality.     Antinio Sanderfer

## 2023-07-06 NOTE — Progress Notes (Signed)
  Progress Note   Date: 07/06/2023  Patient Name: Mario Knapp        MRN#: [id number]    Acute hypoxic respiratory failure was present at the time of admission (POA)

## 2023-07-07 ENCOUNTER — Inpatient Hospital Stay (HOSPITAL_COMMUNITY): Payer: PPO

## 2023-07-07 ENCOUNTER — Other Ambulatory Visit: Payer: Self-pay

## 2023-07-07 LAB — BASIC METABOLIC PANEL
Anion gap: 14 (ref 5–15)
Anion gap: 12 (ref 5–15)
BUN: 31 mg/dL — ABNORMAL HIGH (ref 8–23)
BUN: 38 mg/dL — ABNORMAL HIGH (ref 8–23)
CO2: 27 mmol/L (ref 22–32)
CO2: 26 mmol/L (ref 22–32)
Calcium: 8.2 mg/dL — ABNORMAL LOW (ref 8.9–10.3)
Calcium: 7.9 mg/dL — ABNORMAL LOW (ref 8.9–10.3)
Chloride: 90 mmol/L — ABNORMAL LOW (ref 98–111)
Chloride: 92 mmol/L — ABNORMAL LOW (ref 98–111)
Creatinine, Ser: 1.02 mg/dL (ref 0.61–1.24)
Creatinine, Ser: 1.1 mg/dL (ref 0.61–1.24)
GFR, Estimated: >60 mL/min (ref >60)
GFR, Estimated: >60 mL/min (ref >60)
Glucose, Bld: 298 mg/dL — ABNORMAL HIGH (ref 70–99)
Glucose, Bld: 218 mg/dL — ABNORMAL HIGH (ref 70–99)
Potassium: 5 mmol/L (ref 3.5–5.1)
Potassium: 5.1 mmol/L (ref 3.5–5.1)
Sodium: 131 mmol/L — ABNORMAL LOW (ref 135–145)
Sodium: 130 mmol/L — ABNORMAL LOW (ref 135–145)

## 2023-07-07 LAB — GLUCOSE, CAPILLARY
Glucose-Capillary: 222 mg/dL — ABNORMAL HIGH (ref 70–99)
Glucose-Capillary: 200 mg/dL — ABNORMAL HIGH (ref 70–99)
Glucose-Capillary: 282 mg/dL — ABNORMAL HIGH (ref 70–99)
Glucose-Capillary: 230 mg/dL — ABNORMAL HIGH (ref 70–99)
Glucose-Capillary: 199 mg/dL — ABNORMAL HIGH (ref 70–99)
Glucose-Capillary: 181 mg/dL — ABNORMAL HIGH (ref 70–99)

## 2023-07-07 LAB — POCT I-STAT 7, (LYTES, BLD GAS, ICA,H+H)
Acid-Base Excess: 3 mmol/L — ABNORMAL HIGH (ref 0.0–2.0)
Bicarbonate: 27.6 mmol/L (ref 20.0–28.0)
Calcium, Ion: 1.12 mmol/L — ABNORMAL LOW (ref 1.15–1.40)
HCT: 26 % — ABNORMAL LOW (ref 39.0–52.0)
Hemoglobin: 8.8 g/dL — ABNORMAL LOW (ref 13.0–17.0)
O2 Saturation: 98 %
Patient temperature: 98.1
Potassium: 5.3 mmol/L — ABNORMAL HIGH (ref 3.5–5.1)
Sodium: 129 mmol/L — ABNORMAL LOW (ref 135–145)
TCO2: 29 mmol/L (ref 22–32)
pCO2 arterial: 42.1 mmHg (ref 32–48)
pH, Arterial: 7.423 (ref 7.35–7.45)
pO2, Arterial: 97 mmHg (ref 83–108)

## 2023-07-07 LAB — CBC
HCT: 27.6 % — ABNORMAL LOW (ref 39.0–52.0)
Hemoglobin: 8.7 g/dL — ABNORMAL LOW (ref 13.0–17.0)
MCH: 30 pg (ref 26.0–34.0)
MCHC: 31.5 g/dL (ref 30.0–36.0)
MCV: 95.2 fL (ref 80.0–100.0)
Platelets: 486 10*3/uL — ABNORMAL HIGH (ref 150–400)
RBC: 2.9 MIL/uL — ABNORMAL LOW (ref 4.22–5.81)
RDW: 14.2 % (ref 11.5–15.5)
WBC: 26.1 10*3/uL — ABNORMAL HIGH (ref 4.0–10.5)
nRBC: 0.2 % (ref 0.0–0.2)

## 2023-07-07 MED ORDER — FUROSEMIDE 10 MG/ML IJ SOLN
20.0000 mg | Freq: Once | INTRAMUSCULAR | Status: DC
  Filled 2023-07-07: qty 2

## 2023-07-07 MED ORDER — HYDROMORPHONE HCL 1 MG/ML IJ SOLN
0.5000 mg | INTRAMUSCULAR | Status: DC | PRN
  Administered 2023-07-08 – 2023-07-15 (×21): 1 mg via INTRAVENOUS
  Filled 2023-07-07 (×23): qty 1

## 2023-07-07 MED ORDER — FUROSEMIDE 40 MG PO TABS: 40.0000 mg | ORAL_TABLET | Freq: Every day | ORAL | Status: DC

## 2023-07-07 MED ORDER — FENTANYL CITRATE PF 50 MCG/ML IJ SOSY: 50.0000 ug | PREFILLED_SYRINGE | Freq: Once | INTRAMUSCULAR | Status: DC

## 2023-07-07 MED ORDER — FENTANYL CITRATE PF 50 MCG/ML IJ SOSY
PREFILLED_SYRINGE | INTRAMUSCULAR | Status: AC
  Filled 2023-07-07: qty 2

## 2023-07-07 MED ORDER — DEXMEDETOMIDINE HCL IN NACL 400 MCG/100ML IV SOLN
0.0000 ug/kg/h | INTRAVENOUS | Status: DC
  Administered 2023-07-07 – 2023-07-09 (×2): 0.4 ug/kg/h via INTRAVENOUS
  Administered 2023-07-10 – 2023-07-12 (×2): 0.2 ug/kg/h via INTRAVENOUS
  Administered 2023-07-12: 0.5 ug/kg/h via INTRAVENOUS
  Administered 2023-07-12 – 2023-07-13 (×2): 0.3 ug/kg/h via INTRAVENOUS
  Administered 2023-07-13: 0.6 ug/kg/h via INTRAVENOUS
  Filled 2023-07-07 (×8): qty 100

## 2023-07-07 MED ORDER — LACTULOSE 10 GM/15ML PO SOLN
30.0000 g | Freq: Four times a day (QID) | ORAL | Status: DC
  Administered 2023-07-07: 30 g via ORAL
  Filled 2023-07-07: qty 45

## 2023-07-07 MED ORDER — ROCURONIUM BROMIDE 10 MG/ML (PF) SYRINGE
PREFILLED_SYRINGE | INTRAVENOUS | Status: AC
  Filled 2023-07-07: qty 10

## 2023-07-07 MED ORDER — CALCIUM GLUCONATE-NACL 2-0.675 GM/100ML-% IV SOLN
2.0000 g | Freq: Once | INTRAVENOUS | Status: AC
  Administered 2023-07-07: 2000 mg via INTRAVENOUS
  Filled 2023-07-07: qty 100

## 2023-07-07 MED ORDER — BISACODYL 10 MG RE SUPP
10.0000 mg | Freq: Once | RECTAL | Status: AC
  Administered 2023-07-07: 10 mg via RECTAL

## 2023-07-07 MED ORDER — FENTANYL BOLUS VIA INFUSION: 50.0000 ug | INTRAVENOUS | Status: DC | PRN

## 2023-07-07 MED ORDER — SORBITOL 70 % SOLN: 60.0000 mL | Freq: Once | Status: DC

## 2023-07-07 MED ORDER — POLYETHYLENE GLYCOL 3350 17 G PO PACK
17.0000 g | PACK | Freq: Three times a day (TID) | ORAL | Status: DC
  Administered 2023-07-07 – 2023-07-11 (×9): 17 g via ORAL
  Filled 2023-07-07 (×14): qty 1

## 2023-07-07 MED ORDER — FENTANYL 2500MCG IN NS 250ML (10MCG/ML) PREMIX INFUSION: 50.0000 ug/h | INTRAVENOUS | Status: DC

## 2023-07-07 MED ORDER — MIDAZOLAM HCL 2 MG/2ML IJ SOLN
INTRAMUSCULAR | Status: AC
  Filled 2023-07-07: qty 2

## 2023-07-07 NOTE — Progress Notes (Signed)
Trauma Event Note    Rounding on pt-- anesthesia on phone with Dr. Bedelia Person discussing intubation- decision to hold off for now- Pt is on BiPap, family is at bedside.   IV started per IV team in left forearm.   Last imported Vital Signs BP 130/71   Pulse 99   Temp 98.6 F (37 C) (Axillary)   Resp (!) 23   Ht 5\' 10"  (1.778 m)   Wt 268 lb 11.9 oz (121.9 kg)   SpO2 100%   BMI 38.56 kg/m   Trending CBC Recent Labs    07/06/23 1719 07/07/23 0931  WBC  --  26.1*  HGB 10.2* 8.7*  HCT 30.0* 27.6*  PLT  --  486*    Trending Coag's No results for input(s): "APTT", "INR" in the last 72 hours.  Trending BMET Recent Labs    07/06/23 1719 07/07/23 0931  NA 131* 131*  K 4.4 5.0  CL  --  90*  CO2  --  27  BUN  --  31*  CREATININE  --  1.02  GLUCOSE  --  298*      Mario Knapp  Trauma Response RN  Please call TRN at [phone] for further assistance.

## 2023-07-07 NOTE — Progress Notes (Signed)
After taking patient off of bedpan patient exclaimed that he couldn't breath.  Respiratory therapy was called and RN asked to place patient on BiPap.  Patient is currently on Bipap.

## 2023-07-07 NOTE — TOC CM/SW Note (Signed)
Transition of Care [facility]) - Inpatient Brief Assessment   Patient Details  Name: Mario Knapp MRN: [id number] Date of Birth: 12-02-1945  Transition of [facility]) CM/SW Contact:    Mearl Latin, LCSW Phone Number: 07/07/2023, 3:12 PM   Clinical Narrative: Patient admitted from home with spouse, currently on Bipap. Please place [facility] consult if needs arise.    Transition of Care Asessment: Insurance and Status: Insurance coverage has been reviewed Patient has primary care physician: Yes Home environment has been reviewed: From home Prior level of function:: Independent Prior/Current Home Services: No current home services Social Determinants of Health Reivew: SDOH reviewed no interventions necessary Readmission risk has been reviewed: No Transition of care needs: transition of care needs identified, TOC will continue to follow

## 2023-07-07 NOTE — Progress Notes (Signed)
PT Cancellation Note  Patient Details Name: Mario Knapp MRN: [id number] DOB: Oct 06, 1946   Cancelled Treatment:    Reason Eval/Treat Not Completed: (P) Other (comment). Began to set pt's room and lines up for PT treatment when noted pt's epidural line was disconnected. Notified MD and RN who are currently arranging this to be fixed. Will plan to return later once line is secured.   [name], PT, DPT Acute Rehabilitation Services  Office: [phone]    [name] 07/07/2023, 8:38 AM

## 2023-07-07 NOTE — Progress Notes (Signed)
Trauma/Critical Care Follow Up Note  Subjective:    Overnight Issues:   Objective:  Vital signs for last 24 hours: Temp:  [97.4 F (36.3 C)-98.4 F (36.9 C)] 97.9 F (36.6 C) (08/30 0800) Pulse Rate:  [101-111] 102 (08/30 0800) Resp:  [17-29] 25 (08/30 0800) BP: (112-152)/(54-109) 139/60 (08/30 0800) SpO2:  [87 %-100 %] 94 % (08/30 0800) FiO2 (%):  [50 %] 50 % (08/30 0047)  Hemodynamic parameters for last 24 hours:    Intake/Output from previous day: 08/29 0701 - 08/30 0700 In: 912.5 [I.V.:738] Out: 500 [Urine:500]  Intake/Output this shift: Total I/O In: 20 [Other:20] Out: -   Vent settings for last 24 hours: FiO2 (%):  [50 %] 50 % PEEP:  [5 cmH20] 5 cmH20  Physical Exam:  Gen: comfortable, no distress Neuro: follows commands, alert, communicative HEENT: PERRL Neck: supple CV: RRR Pulm: unlabored breathing on [place] 5L, did not tolerate bipap o/n Abd: soft, NT , distended  GU: urine  oliguric, , +spontaneous voids Extr: wwp, no edema  Results for orders placed or performed during the hospital encounter of 07/04/23 (from the past 24 hour(s))  Glucose, capillary     Status: Abnormal   Collection Time: 07/06/23 10:59 AM  Result Value Ref Range   Glucose-Capillary 218 (H) 70 - 99 mg/dL  Glucose, capillary     Status: Abnormal   Collection Time: 07/06/23  3:48 PM  Result Value Ref Range   Glucose-Capillary 344 (H) 70 - 99 mg/dL  I-STAT 7, (LYTES, BLD GAS, ICA, H+H)     Status: Abnormal   Collection Time: 07/06/23  5:19 PM  Result Value Ref Range   pH, Arterial 7.376 7.35 - 7.45   pCO2 arterial 50.7 (H) 32 - 48 mmHg   pO2, Arterial 32 (LL) 83 - 108 mmHg   Bicarbonate 29.7 (H) 20.0 - 28.0 mmol/L   TCO2 31 22 - 32 mmol/L   O2 Saturation 58 %   Acid-Base Excess 4.0 (H) 0.0 - 2.0 mmol/L   Sodium 131 (L) 135 - 145 mmol/L   Potassium 4.4 3.5 - 5.1 mmol/L   Calcium, Ion 1.15 1.15 - 1.40 mmol/L   HCT 30.0 (L) 39.0 - 52.0 %   Hemoglobin 10.2 (L) 13.0 - 17.0 g/dL    Sample type ARTERIAL    Comment NOTIFIED PHYSICIAN   Glucose, capillary     Status: Abnormal   Collection Time: 07/06/23  7:55 PM  Result Value Ref Range   Glucose-Capillary 280 (H) 70 - 99 mg/dL  Glucose, capillary     Status: Abnormal   Collection Time: 07/06/23 11:45 PM  Result Value Ref Range   Glucose-Capillary 243 (H) 70 - 99 mg/dL  Glucose, capillary     Status: Abnormal   Collection Time: 07/07/23  3:50 AM  Result Value Ref Range   Glucose-Capillary 222 (H) 70 - 99 mg/dL  Glucose, capillary     Status: Abnormal   Collection Time: 07/07/23  7:30 AM  Result Value Ref Range   Glucose-Capillary 200 (H) 70 - 99 mg/dL    Assessment & Plan:  Present on Admission:  Flail chest    LOS: 3 days   Additional comments:I reviewed the patient's new clinical lab test results.   and I reviewed the patients new imaging test results.    Syncopal fall   Syncope - echo completed, carotid US pending, EKG noted L rib fx 6-11 with flail segment - aggressive pulm toilet, epidural placed by Anesthesia, needed bolus  early this AM but this did help a lot, noted TCTS does not recommend plating Acute hypoxic respiratory failure - due to above, 5L [place] currently, increased from prior, decreased IS ability. Lengthy discussion this AM regarding intubation desires given continued clinical decline despite maximal interventions. Previously d/w Dr. Janee Morn that he would not like to be intubated, but would like to re-visit that discussion with his family today.  L temporal lobe arachnoid cyst - I discussed with Dr. Franky Macho - no treatment needed Insomnia/delirium - maintain sleep wake cycle during the day, sleep aid FEN - regular diet, needs to move bowels as abdominal distention pushing up on chest. Lasix. NGT placed o/n, patient removed.  DVT - SCDs, SQH while epidural in place Dispo - ICU   Critical Care Total Time: 45 minutes  Diamantina Monks, MD Trauma & General Surgery Please use [email or web address] to  contact on call provider  07/07/2023  *Care during the described time interval was provided by me. I have reviewed this patient's available data, including medical history, events of note, physical examination and test results as part of my evaluation.

## 2023-07-07 NOTE — Progress Notes (Signed)
Continued respiratory decline. Now on bipap with labored effort off bipap. Anticipate indication for intubation within the next 12-24h. Clinical update provided to family at bedside and wife via video chat in the room. All questions answered. Currently, decision is for no intubation, however based on discussions, I suspect this will change.   Critical care time:  Diamantina Monks, MD General and Trauma Surgery [facility] Surgery

## 2023-07-07 NOTE — Progress Notes (Signed)
Physical Therapy Treatment Patient Details Name: Mario Knapp MRN: [id number] DOB: 18-Sep-1946 Today's Date: 07/07/2023   History of Present Illness Pt is 77 yo male who presents on 07/03/23 after a syncopal episode at home. Pt with 2 week hx of L sided chest pain, on abx from urgent care. Found with L fib fx 6-11 with flail segment. PMH includes: arthritis, depression, HTN, sleep apnea.    PT Comments  Focused session on progressing pt's activity tolerance and distance he is able to ambulate. Cued pt on breathing technique, pacing himself, and utilizing a rollator for energy conservation. Pt was able to ambulate an increased distance of up to ~105 ft during one bout at a CGA while utilizing the rollator. Will continue to follow acutely.   If plan is discharge home, recommend the following: Help with stairs or ramp for entrance;Assist for transportation;Assistance with cooking/housework;A little help with walking and/or transfers;A little help with bathing/dressing/bathroom   Can travel by private vehicle        Equipment Recommendations  Rollator (4 wheels) (bariatric)    Recommendations for Other Services       Precautions / Restrictions Precautions Precautions: Fall Precaution Comments: watch vitals; epidural Restrictions Weight Bearing Restrictions: No     Mobility  Bed Mobility Overal bed mobility: Needs Assistance Bed Mobility: Sit to Supine       Sit to supine: Supervision, Used rails   General bed mobility comments: Supervision for safety and line management. Pt needing extra time to adjust self midline in bed due to pain and SOB    Transfers Overall transfer level: Needs assistance Equipment used: Rollator (4 wheels) Transfers: Sit to/from Stand Sit to Stand: Contact guard assist           General transfer comment: CGA for safety transferring to stand from recliner 1x and from rollator 1x (held rollator still for pt due to brakes not working well)     Ambulation/Gait Ambulation/Gait assistance: Contact guard assist Gait Distance (Feet): 105 Feet (x2 bouts of ~45 ft > ~105 ft) Assistive device: Rollator (4 wheels) Gait Pattern/deviations: Step-through pattern, Decreased stride length Gait velocity: decreased Gait velocity interpretation: 1.31 - 2.62 ft/sec, indicative of limited community ambulator   General Gait Details: Encouraged pt to pace himself to safely progress his distance, focusing on breathing in through nose and out through mouth. No LOB, CGA. x1 seated rest break in between gait bouts.   Stairs             Wheelchair Mobility     Tilt Bed    Modified Rankin (Stroke Patients Only)       Balance Overall balance assessment: Mild deficits observed, not formally tested                                          Cognition Arousal: Alert Behavior During Therapy: WFL for tasks assessed/performed Overall Cognitive Status: Within Functional Limits for tasks assessed                                          Exercises      General Comments General comments (skin integrity, edema, etc.): SpO2 >/= 88% on 5-6L O2 via [place]; BP 134/57 start of session, 178/105 while ambulating, 167/61 after ambulating  Pertinent Vitals/Pain Pain Assessment Pain Assessment: Faces Faces Pain Scale: Hurts little more Pain Location: abdomen, L side Pain Descriptors / Indicators: Discomfort, Grimacing, Guarding Pain Intervention(s): Limited activity within patient's tolerance, Monitored during session, Repositioned    Home Living                          Prior Function            PT Goals (current goals can now be found in the care plan section) Acute Rehab PT Goals Patient Stated Goal: return home PT Goal Formulation: With patient/family Time For Goal Achievement: 07/18/23 Potential to Achieve Goals: Good Progress towards PT goals: Progressing toward goals     Frequency    Min 1X/week      PT Plan      Co-evaluation              AM-PAC PT "6 Clicks" Mobility   Outcome Measure  Help needed turning from your back to your side while in a flat bed without using bedrails?: None Help needed moving from lying on your back to sitting on the side of a flat bed without using bedrails?: None Help needed moving to and from a bed to a chair (including a wheelchair)?: A Little Help needed standing up from a chair using your arms (e.g., wheelchair or bedside chair)?: A Little Help needed to walk in hospital room?: A Little Help needed climbing 3-5 steps with a railing? : A Little 6 Click Score: 20    End of Session Equipment Utilized During Treatment: Oxygen Activity Tolerance: Patient tolerated treatment well;Patient limited by fatigue;Other (comment) (limited by lightheadedness) Patient left: with call bell/phone within reach;with family/visitor present;in bed;with bed alarm set Nurse Communication: Mobility status;Other (comment) (vitals) PT Visit Diagnosis: Unsteadiness on feet (R26.81);Difficulty in walking, not elsewhere classified (R26.2);Pain;Other abnormalities of gait and mobility (R26.89);Dizziness and giddiness (R42)     Time: 0960-4540 PT Time Calculation (min) (ACUTE ONLY): 18 min  Charges:    $Therapeutic Exercise: 8-22 mins PT General Charges $$ ACUTE PT VISIT: 1 Visit                     [name], PT, DPT Acute Rehabilitation Services  Office: [phone]    [name] 07/07/2023, 2:16 PM

## 2023-07-07 NOTE — Progress Notes (Signed)
This RN called and spoke with Anesthesia.  Alerted her that the epidural in 4N25 was leaking at the insertion site.  I was told that someone would be there to check on this.

## 2023-07-07 NOTE — Anesthesia Post-op Follow-up Note (Signed)
  Anesthesia Pain Follow-up Note  Patient: Mario Knapp  Day #: 2  Date of Follow-up: 07/07/2023 Time: 8:55 AM  Last Vitals:  Vitals:   07/07/23 0700 07/07/23 0800  BP:  139/60  Pulse: (!) 110 (!) 102  Resp: (!) 29 (!) 25  Temp:  36.6 C  SpO2: 92% 94%    Level of Consciousness: alert  Pain: mild   Side Effects:None  Catheter Site Exam:clean  Anti-Coag Meds (From admission, onward)    Start     Dose/Rate Route Frequency Ordered Stop   07/06/23 1400  heparin injection 5,000 Units        5,000 Units Subcutaneous Every 8 hours 07/06/23 1306        Epidural / Intrathecal (From admission, onward)    Start     Dose/Rate Route Frequency Ordered Stop   07/05/23 1145  ropivacaine (PF) 2 mg/mL (0.2%) (NAROPIN) injection        10 mL/hr 10 mL/hr  Epidural Continuous 07/05/23 1050          Plan: Continue current therapy of postop epidural at surgeon's request  Epidural replaced overnight.  Catheter disconnected from clip this am.  Catheter reconnected.  Pain controlled.  Lowella Curb

## 2023-07-07 NOTE — Addendum Note (Signed)
Addendum  created 07/07/23 0859 by Lowella Curb, MD   Clinical Note Signed

## 2023-07-07 NOTE — Progress Notes (Signed)
Patient agreeable to intubation and tracheostomy if indicated. Daughter, Maliik Schons, at bedside is who patient designates as his medical decision-maker after intubation. IV access has been lost, IV team at bedside for placement. When access established, will proceed with intubation.   Critical care time:  Diamantina Monks, MD General and Trauma Surgery [facility] Surgery

## 2023-07-08 ENCOUNTER — Inpatient Hospital Stay (HOSPITAL_COMMUNITY): Payer: PPO

## 2023-07-08 LAB — GLUCOSE, CAPILLARY
Glucose-Capillary: 141 mg/dL — ABNORMAL HIGH (ref 70–99)
Glucose-Capillary: 158 mg/dL — ABNORMAL HIGH (ref 70–99)
Glucose-Capillary: 163 mg/dL — ABNORMAL HIGH (ref 70–99)
Glucose-Capillary: 176 mg/dL — ABNORMAL HIGH (ref 70–99)
Glucose-Capillary: 196 mg/dL — ABNORMAL HIGH (ref 70–99)
Glucose-Capillary: 221 mg/dL — ABNORMAL HIGH (ref 70–99)

## 2023-07-08 LAB — POCT I-STAT 7, (LYTES, BLD GAS, ICA,H+H)
Acid-Base Excess: 2 mmol/L (ref 0.0–2.0)
Bicarbonate: 26.6 mmol/L (ref 20.0–28.0)
Calcium, Ion: 1.13 mmol/L — ABNORMAL LOW (ref 1.15–1.40)
HCT: 22 % — ABNORMAL LOW (ref 39.0–52.0)
Hemoglobin: 7.5 g/dL — ABNORMAL LOW (ref 13.0–17.0)
O2 Saturation: 95 %
Patient temperature: 96.3
Potassium: 5 mmol/L (ref 3.5–5.1)
Sodium: 132 mmol/L — ABNORMAL LOW (ref 135–145)
TCO2: 28 mmol/L (ref 22–32)
pCO2 arterial: 37.8 mmHg (ref 32–48)
pH, Arterial: 7.449 (ref 7.35–7.45)
pO2, Arterial: 68 mmHg — ABNORMAL LOW (ref 83–108)

## 2023-07-08 LAB — BASIC METABOLIC PANEL
Anion gap: 7 (ref 5–15)
Anion gap: 10 (ref 5–15)
BUN: 40 mg/dL — ABNORMAL HIGH (ref 8–23)
BUN: 42 mg/dL — ABNORMAL HIGH (ref 8–23)
CO2: 27 mmol/L (ref 22–32)
CO2: 26 mmol/L (ref 22–32)
Calcium: 8.1 mg/dL — ABNORMAL LOW (ref 8.9–10.3)
Calcium: 7.9 mg/dL — ABNORMAL LOW (ref 8.9–10.3)
Chloride: 95 mmol/L — ABNORMAL LOW (ref 98–111)
Chloride: 96 mmol/L — ABNORMAL LOW (ref 98–111)
Creatinine, Ser: 0.98 mg/dL (ref 0.61–1.24)
Creatinine, Ser: 0.91 mg/dL (ref 0.61–1.24)
GFR, Estimated: >60 mL/min (ref >60)
GFR, Estimated: >60 mL/min (ref >60)
Glucose, Bld: 174 mg/dL — ABNORMAL HIGH (ref 70–99)
Glucose, Bld: 184 mg/dL — ABNORMAL HIGH (ref 70–99)
Potassium: 5.1 mmol/L (ref 3.5–5.1)
Potassium: 5.2 mmol/L — ABNORMAL HIGH (ref 3.5–5.1)
Sodium: 129 mmol/L — ABNORMAL LOW (ref 135–145)
Sodium: 132 mmol/L — ABNORMAL LOW (ref 135–145)

## 2023-07-08 LAB — CBC
HCT: 23.4 % — ABNORMAL LOW (ref 39.0–52.0)
Hemoglobin: 7.4 g/dL — ABNORMAL LOW (ref 13.0–17.0)
MCH: 29.2 pg (ref 26.0–34.0)
MCHC: 31.6 g/dL (ref 30.0–36.0)
MCV: 92.5 fL (ref 80.0–100.0)
Platelets: 477 10*3/uL — ABNORMAL HIGH (ref 150–400)
RBC: 2.53 MIL/uL — ABNORMAL LOW (ref 4.22–5.81)
RDW: 14.6 % (ref 11.5–15.5)
WBC: 23 10*3/uL — ABNORMAL HIGH (ref 4.0–10.5)
nRBC: 0.1 % (ref 0.0–0.2)

## 2023-07-08 LAB — CULTURE, BLOOD (ROUTINE X 2)
Culture: NO GROWTH
Culture: NO GROWTH
Special Requests: ADEQUATE
Special Requests: ADEQUATE

## 2023-07-08 MED ORDER — SODIUM CHLORIDE 0.9 % IV SOLN: INTRAVENOUS | Status: DC | PRN

## 2023-07-08 MED ORDER — FUROSEMIDE 10 MG/ML IJ SOLN
20.0000 mg | Freq: Once | INTRAMUSCULAR | Status: AC
  Administered 2023-07-08: 20 mg via INTRAVENOUS
  Filled 2023-07-08: qty 2

## 2023-07-08 MED ORDER — SODIUM CHLORIDE 0.9 % IV BOLUS
500.0000 mL | Freq: Once | INTRAVENOUS | Status: AC
  Administered 2023-07-08: 500 mL via INTRAVENOUS

## 2023-07-08 MED ORDER — METHOCARBAMOL 1000 MG/10ML IJ SOLN
750.0000 mg | Freq: Three times a day (TID) | INTRAVENOUS | Status: DC
  Administered 2023-07-08 – 2023-07-12 (×12): 750 mg via INTRAVENOUS
  Filled 2023-07-08 (×16): qty 7.5

## 2023-07-08 MED ORDER — SODIUM ZIRCONIUM CYCLOSILICATE 5 G PO PACK
5.0000 g | PACK | Freq: Once | ORAL | Status: DC
  Filled 2023-07-08: qty 1

## 2023-07-08 MED ORDER — SODIUM CHLORIDE 0.9% FLUSH
10.0000 mL | Freq: Two times a day (BID) | INTRAVENOUS | Status: DC
  Administered 2023-07-08 – 2023-07-09 (×4): 10 mL
  Administered 2023-07-10: 30 mL
  Administered 2023-07-10: 40 mL
  Administered 2023-07-11: 20 mL
  Administered 2023-07-11: 30 mL
  Administered 2023-07-12 (×2): 20 mL
  Administered 2023-07-13: 30 mL
  Administered 2023-07-13: 10 mL
  Administered 2023-07-14: 20 mL
  Administered 2023-07-14: 10 mL

## 2023-07-08 MED ORDER — SODIUM CHLORIDE 0.9% FLUSH
10.0000 mL | INTRAVENOUS | Status: DC | PRN
  Administered 2023-07-13: 30 mL

## 2023-07-08 NOTE — Progress Notes (Signed)
Peripherally Inserted Central Catheter Placement  The IV Nurse has discussed with the patient and/or persons authorized to consent for the patient, the purpose of this procedure and the potential benefits and risks involved with this procedure.  The benefits include less needle sticks, lab draws from the catheter, and the patient may be discharged home with the catheter. Risks include, but not limited to, infection, bleeding, blood clot (thrombus formation), and puncture of an artery; nerve damage and irregular heartbeat and possibility to perform a PICC exchange if needed/ordered by physician.  Alternatives to this procedure were also discussed.  Bard Power PICC patient education guide, fact sheet on infection prevention and patient information card has been provided to patient /or left at bedside. Consent signed by daughter Kriste Basque, at bedside per patient request.   PICC Placement Documentation  PICC Triple Lumen 07/08/23 Right Basilic 42 cm 0 cm (Active)  Indication for Insertion or Continuance of Line Limited venous access - need for IV therapy >5 days (PICC only) 07/08/23 0830  Exposed Catheter (cm) 0 cm 07/08/23 0830  Site Assessment Clean, Dry, Intact 07/08/23 0830  Lumen #1 Status Saline locked;Flushed;Blood return noted 07/08/23 0830  Lumen #2 Status Saline locked;Flushed;Blood return noted 07/08/23 0830  Lumen #3 Status Saline locked;Flushed;Blood return noted 07/08/23 0830  Dressing Type Transparent;Securing device 07/08/23 0830  Dressing Status Antimicrobial disc in place;Clean, Dry, Intact 07/08/23 0830  Line Care Connections checked and tightened 07/08/23 0830  Line Adjustment (NICU/IV Team Only) No 07/08/23 0830  Dressing Intervention New dressing 07/08/23 0830  Dressing Change Due  07/08/23 0830       Burnard Bunting Chenice 07/08/2023, 8:31 AM

## 2023-07-08 NOTE — Anesthesia Post-op Follow-up Note (Signed)
  Anesthesia Pain Follow-up Note  Patient: Mario Knapp  Day #: 3  Date of Follow-up: 07/08/2023 Time: 3:41 PM  Last Vitals:  Vitals:   07/08/23 1500 07/08/23 1540  BP:    Pulse: 90 93  Resp: 15 (!) 24  Temp:    SpO2: 100% 100%    Level of Consciousness: alert  Pain: moderate   Side Effects:None  Catheter Site Exam:clean, dry, no drainage  Anti-Coag Meds (From admission, onward)    Start     Dose/Rate Route Frequency Ordered Stop   07/06/23 1400  heparin injection 5,000 Units        5,000 Units Subcutaneous Every 8 hours 07/06/23 1306        Epidural / Intrathecal (From admission, onward)    Start     Dose/Rate Route Frequency Ordered Stop   07/05/23 1145  ropivacaine (PF) 2 mg/mL (0.2%) (NAROPIN) injection        10 mL/hr 10 mL/hr  Epidural Continuous 07/05/23 1050        Assessment: Epidural infusion was turned off overnight.  It was restarted approximately 2pm today.  Epidural infusion pump is currently running at 10 mL/h of 0.2% Ropivacaine.  A bolus of 6mL 0.2% Ropivacaine was administered at 3:15pm in order to help patient achieve improved comfort.  Plan: Continue current therapy of postop epidural at surgeon's request  Treven Holtman S

## 2023-07-08 NOTE — Addendum Note (Signed)
Addendum  created 07/08/23 1550 by Achille Rich, MD   Clinical Note Signed

## 2023-07-08 NOTE — Plan of Care (Signed)
  Problem: Clinical Measurements: Goal: Diagnostic test results will improve Outcome: Progressing Goal: Respiratory complications will improve Outcome: Progressing Goal: Cardiovascular complication will be avoided Outcome: Progressing   Problem: Activity: Goal: Risk for activity intolerance will decrease Outcome: Progressing   Problem: Coping: Goal: Level of anxiety will decrease Outcome: Progressing   

## 2023-07-08 NOTE — Procedures (Signed)
Arterial Catheter Insertion Procedure Note  Ness Vanessen  [id number]  1946-09-20  Date:07/08/23  Time:1:18 PM    Provider Performing: Ermalinda Memos E    Procedure: Insertion of Arterial Line (40981) without US guidance  Indication(s) Blood pressure monitoring and/or need for frequent ABGs  Consent Risks of the procedure as well as the alternatives and risks of each were explained to the patient and/or caregiver.  Consent for the procedure was obtained and is signed in the bedside chart  Anesthesia None   Time Out Verified patient identification, verified procedure, site/side was marked, verified correct patient position, special equipment/implants available, medications/allergies/relevant history reviewed, required imaging and test results available.   Sterile Technique Maximal sterile technique including full sterile barrier drape, hand hygiene, sterile gown, sterile gloves, mask, hair covering, sterile ultrasound probe cover (if used).   Procedure Description Area of catheter insertion was cleaned with chlorhexidine and draped in sterile fashion. Without real-time ultrasound guidance an arterial catheter was placed into the left radial artery.  Appropriate arterial tracings confirmed on monitor.     Complications/Tolerance None; patient tolerated the procedure well.   RT obtained art line without complications, RTx2 at bedside for procedure   Specimen(s) None

## 2023-07-08 NOTE — Progress Notes (Signed)
Trauma/Critical Care Follow Up Note  Subjective:    Overnight Issues:   Objective:  Vital signs for last 24 hours: Temp:  [97.5 F (36.4 C)-98.6 F (37 C)] 98.5 F (36.9 C) (08/31 0800) Pulse Rate:  [89-120] 89 (08/31 0700) Resp:  [14-33] 14 (08/31 0700) BP: (89-166)/(48-146) 124/61 (08/31 0700) SpO2:  [93 %-100 %] 99 % (08/31 0700) FiO2 (%):  [40 %] 40 % (08/31 0324) Weight:  [127.4 kg] 127.4 kg (08/31 0500)  Hemodynamic parameters for last 24 hours:    Intake/Output from previous day: 08/30 0701 - 08/31 0700 In: 122.5 [I.V.:10.8] Out: -   Intake/Output this shift: No intake/output data recorded.  Vent settings for last 24 hours: Vent Mode: PCV;CPAP FiO2 (%):  [40 %] 40 % Set Rate:  [20 bmp] 20 bmp PEEP:  [8 cmH20] 8 cmH20  Physical Exam:  Gen: comfortable, no distress Neuro: follows commands, alert, communicative HEENT: PERRL Neck: supple CV: RRR Pulm: unlabored breathing on Luke, trialing off bipap, less labored breathing than yest off bipap Abd: soft, NT , distended but improved, tympanitic still  GU: urine  anuric o/n Extr: wwp, no edema  Results for orders placed or performed during the hospital encounter of 07/04/23 (from the past 24 hour(s))  CBC     Status: Abnormal   Collection Time: 07/07/23  9:31 AM  Result Value Ref Range   WBC 26.1 (H) 4.0 - 10.5 K/uL   RBC 2.90 (L) 4.22 - 5.81 MIL/uL   Hemoglobin 8.7 (L) 13.0 - 17.0 g/dL   HCT 16.1 (L) 09.6 - 04.5 %   MCV 95.2 80.0 - 100.0 fL   MCH 30.0 26.0 - 34.0 pg   MCHC 31.5 30.0 - 36.0 g/dL   RDW 40.9 81.1 - 91.4 %   Platelets 486 (H) 150 - 400 K/uL   nRBC 0.2 0.0 - 0.2 %  Basic metabolic panel     Status: Abnormal   Collection Time: 07/07/23  9:31 AM  Result Value Ref Range   Sodium 131 (L) 135 - 145 mmol/L   Potassium 5.0 3.5 - 5.1 mmol/L   Chloride 90 (L) 98 - 111 mmol/L   CO2 27 22 - 32 mmol/L   Glucose, Bld 298 (H) 70 - 99 mg/dL   BUN 31 (H) 8 - 23 mg/dL   Creatinine, Ser 7.82 0.61 -  1.24 mg/dL   Calcium 8.2 (L) 8.9 - 10.3 mg/dL   GFR, Estimated >95 >62 mL/min   Anion gap 14 5 - 15  Glucose, capillary     Status: Abnormal   Collection Time: 07/07/23 11:46 AM  Result Value Ref Range   Glucose-Capillary 282 (H) 70 - 99 mg/dL  Glucose, capillary     Status: Abnormal   Collection Time: 07/07/23  3:20 PM  Result Value Ref Range   Glucose-Capillary 230 (H) 70 - 99 mg/dL  Glucose, capillary     Status: Abnormal   Collection Time: 07/07/23  7:45 PM  Result Value Ref Range   Glucose-Capillary 199 (H) 70 - 99 mg/dL  Basic metabolic panel     Status: Abnormal   Collection Time: 07/07/23  8:00 PM  Result Value Ref Range   Sodium 130 (L) 135 - 145 mmol/L   Potassium 5.1 3.5 - 5.1 mmol/L   Chloride 92 (L) 98 - 111 mmol/L   CO2 26 22 - 32 mmol/L   Glucose, Bld 218 (H) 70 - 99 mg/dL   BUN 38 (H) 8 - 23 mg/dL  Creatinine, Ser 1.10 0.61 - 1.24 mg/dL   Calcium 7.9 (L) 8.9 - 10.3 mg/dL   GFR, Estimated >62 >95 mL/min   Anion gap 12 5 - 15  I-STAT 7, (LYTES, BLD GAS, ICA, H+H)     Status: Abnormal   Collection Time: 07/07/23  9:07 PM  Result Value Ref Range   pH, Arterial 7.423 7.35 - 7.45   pCO2 arterial 42.1 32 - 48 mmHg   pO2, Arterial 97 83 - 108 mmHg   Bicarbonate 27.6 20.0 - 28.0 mmol/L   TCO2 29 22 - 32 mmol/L   O2 Saturation 98 %   Acid-Base Excess 3.0 (H) 0.0 - 2.0 mmol/L   Sodium 129 (L) 135 - 145 mmol/L   Potassium 5.3 (H) 3.5 - 5.1 mmol/L   Calcium, Ion 1.12 (L) 1.15 - 1.40 mmol/L   HCT 26.0 (L) 39.0 - 52.0 %   Hemoglobin 8.8 (L) 13.0 - 17.0 g/dL   Patient temperature 28.4 F    Collection site RADIAL, ALLEN'S TEST ACCEPTABLE    Drawn by RT    Sample type ARTERIAL   Glucose, capillary     Status: Abnormal   Collection Time: 07/07/23 11:23 PM  Result Value Ref Range   Glucose-Capillary 181 (H) 70 - 99 mg/dL  Glucose, capillary     Status: Abnormal   Collection Time: 07/08/23  3:31 AM  Result Value Ref Range   Glucose-Capillary 163 (H) 70 - 99 mg/dL   CBC     Status: Abnormal   Collection Time: 07/08/23  4:01 AM  Result Value Ref Range   WBC 23.0 (H) 4.0 - 10.5 K/uL   RBC 2.53 (L) 4.22 - 5.81 MIL/uL   Hemoglobin 7.4 (L) 13.0 - 17.0 g/dL   HCT 13.2 (L) 44.0 - 10.2 %   MCV 92.5 80.0 - 100.0 fL   MCH 29.2 26.0 - 34.0 pg   MCHC 31.6 30.0 - 36.0 g/dL   RDW 72.5 36.6 - 44.0 %   Platelets 477 (H) 150 - 400 K/uL   nRBC 0.1 0.0 - 0.2 %  Basic metabolic panel     Status: Abnormal   Collection Time: 07/08/23  4:01 AM  Result Value Ref Range   Sodium 129 (L) 135 - 145 mmol/L   Potassium 5.1 3.5 - 5.1 mmol/L   Chloride 95 (L) 98 - 111 mmol/L   CO2 27 22 - 32 mmol/L   Glucose, Bld 174 (H) 70 - 99 mg/dL   BUN 40 (H) 8 - 23 mg/dL   Creatinine, Ser 3.47 0.61 - 1.24 mg/dL   Calcium 8.1 (L) 8.9 - 10.3 mg/dL   GFR, Estimated >42 >59 mL/min   Anion gap 7 5 - 15  Glucose, capillary     Status: Abnormal   Collection Time: 07/08/23  7:42 AM  Result Value Ref Range   Glucose-Capillary 158 (H) 70 - 99 mg/dL    Assessment & Plan: The plan of care was discussed with the bedside nurse for the day, Cara, who is in agreement with this plan and no additional concerns were raised.   Present on Admission:  Flail chest    LOS: 4 days   Additional comments:I reviewed the patient's new clinical lab test results.   and I reviewed the patients new imaging test results.    Syncopal fall   Syncope - echo completed, carotid US pending, EKG noted L rib fx 6-11 with flail segment - aggressive pulm toilet, epidural placed by Anesthesia, noted  TCTS does not recommend plating Acute hypoxic respiratory failure - due to above, just transitioned off bipap and only moderately labored breathing, check ABG after 2h off bipap. Patient changed his mind yesterday afternoon after discussion with his family and is not agreeable to intubation and trach as indicated. Hoping to keep him off the vent due to risk of complications and challenges liberating from vent. For now, I  think bipap is adequate, but remains at high risk for respiratory decompensation warranting intubation. Improvement in air hunger with precedex, though dropped his BP, continue prn.  L temporal lobe arachnoid cyst - I discussed with Dr. Franky Macho - no treatment needed Insomnia/delirium - maintain sleep wake cycle during the day, sleep aid FEN - NPO in case of resp decline, moved bowels yest, improved abdominal distention pushing up on chest. Lasix.  DVT - SCDs, SQH while epidural in place Dispo - ICU  Critical Care Total Time: 35 minutes  Diamantina Monks, MD Trauma & General Surgery Please use [email or web address] to contact on call provider  07/08/2023  *Care during the described time interval was provided by me. I have reviewed this patient's available data, including medical history, events of note, physical examination and test results as part of my evaluation.

## 2023-07-08 NOTE — Progress Notes (Signed)
Placed pt on CPAP on ventilator with a peep of 8 and pressure support of 0. Pt states he is comfortable at this time.

## 2023-07-09 ENCOUNTER — Inpatient Hospital Stay (HOSPITAL_COMMUNITY): Payer: PPO

## 2023-07-09 LAB — CBC
HCT: 20.2 % — ABNORMAL LOW (ref 39.0–52.0)
Hemoglobin: 6.4 g/dL — CL (ref 13.0–17.0)
MCH: 29.9 pg (ref 26.0–34.0)
MCHC: 31.7 g/dL (ref 30.0–36.0)
MCV: 94.4 fL (ref 80.0–100.0)
Platelets: 420 10*3/uL — ABNORMAL HIGH (ref 150–400)
RBC: 2.14 MIL/uL — ABNORMAL LOW (ref 4.22–5.81)
RDW: 14.8 % (ref 11.5–15.5)
WBC: 16.4 10*3/uL — ABNORMAL HIGH (ref 4.0–10.5)
nRBC: 0.1 % (ref 0.0–0.2)

## 2023-07-09 LAB — BASIC METABOLIC PANEL
Anion gap: 10 (ref 5–15)
BUN: 47 mg/dL — ABNORMAL HIGH (ref 8–23)
CO2: 25 mmol/L (ref 22–32)
Calcium: 7.6 mg/dL — ABNORMAL LOW (ref 8.9–10.3)
Chloride: 98 mmol/L (ref 98–111)
Creatinine, Ser: 0.92 mg/dL (ref 0.61–1.24)
GFR, Estimated: >60 mL/min (ref >60)
Glucose, Bld: 158 mg/dL — ABNORMAL HIGH (ref 70–99)
Potassium: 5.3 mmol/L — ABNORMAL HIGH (ref 3.5–5.1)
Sodium: 133 mmol/L — ABNORMAL LOW (ref 135–145)

## 2023-07-09 LAB — GLUCOSE, CAPILLARY
Glucose-Capillary: 124 mg/dL — ABNORMAL HIGH (ref 70–99)
Glucose-Capillary: 139 mg/dL — ABNORMAL HIGH (ref 70–99)
Glucose-Capillary: 140 mg/dL — ABNORMAL HIGH (ref 70–99)
Glucose-Capillary: 151 mg/dL — ABNORMAL HIGH (ref 70–99)
Glucose-Capillary: 158 mg/dL — ABNORMAL HIGH (ref 70–99)
Glucose-Capillary: 165 mg/dL — ABNORMAL HIGH (ref 70–99)

## 2023-07-09 LAB — PREPARE RBC (CROSSMATCH)

## 2023-07-09 LAB — HEMOGLOBIN AND HEMATOCRIT, BLOOD
HCT: 28.7 % — ABNORMAL LOW (ref 39.0–52.0)
Hemoglobin: 9.2 g/dL — ABNORMAL LOW (ref 13.0–17.0)

## 2023-07-09 MED ORDER — SODIUM ZIRCONIUM CYCLOSILICATE 5 G PO PACK
5.0000 g | PACK | Freq: Once | ORAL | Status: DC
  Filled 2023-07-09: qty 1

## 2023-07-09 MED ORDER — SODIUM CHLORIDE 0.9% IV SOLUTION: Freq: Once | INTRAVENOUS | Status: DC

## 2023-07-09 MED ORDER — FUROSEMIDE 10 MG/ML IJ SOLN
20.0000 mg | Freq: Once | INTRAMUSCULAR | Status: AC
  Administered 2023-07-09: 20 mg via INTRAVENOUS
  Filled 2023-07-09: qty 2

## 2023-07-09 NOTE — Addendum Note (Signed)
Addendum  created 07/09/23 1302 by Atilano Median, DO   Clinical Note Signed

## 2023-07-09 NOTE — Anesthesia Post-op Follow-up Note (Signed)
  Anesthesia Pain Follow-up Note  Patient: Mario Knapp  Day #: 4  Date of Follow-up: 07/09/2023 Time: 1:02 PM  Last Vitals:  Vitals:   07/09/23 1200 07/09/23 1222  BP: (!) 157/71 (!) 145/71  Pulse: (!) 106   Resp: 20   Temp: 37.3 C 37.3 C  SpO2: 99%     Level of Consciousness: alert  Pain: 5 /10   Side Effects:None  Catheter Site Exam:clean, dry  Anti-Coag Meds (From admission, onward)    Start     Dose/Rate Route Frequency Ordered Stop   07/06/23 1400  heparin injection 5,000 Units        5,000 Units Subcutaneous Every 8 hours 07/06/23 1306        Epidural / Intrathecal (From admission, onward)    Start     Dose/Rate Route Frequency Ordered Stop   07/05/23 1145  ropivacaine (PF) 2 mg/mL (0.2%) (NAROPIN) injection        10 mL/hr 10 mL/hr  Epidural Continuous 07/05/23 1050          Plan: Continue current therapy of postop epidural at surgeon's request  [name]

## 2023-07-09 NOTE — Progress Notes (Signed)
Trauma/Critical Care Follow Up Note  Subjective:    Overnight Issues:   Objective:  Vital signs for last 24 hours: Temp:  [96.3 F (35.7 C)-98.8 F (37.1 C)] 98.7 F (37.1 C) (09/01 0800) Pulse Rate:  [90-109] 101 (09/01 0700) Resp:  [14-28] 17 (09/01 0700) BP: (93-166)/(36-79) 153/78 (09/01 0700) SpO2:  [100 %] 100 % (09/01 0700) Arterial Line BP: (94-178)/(45-65) 135/55 (09/01 0700) FiO2 (%):  [40 %] 40 % (09/01 0344)  Hemodynamic parameters for last 24 hours:    Intake/Output from previous day: 08/31 0701 - 09/01 0700 In: 1011.7 [P.O.:150; I.V.:43.8; IV Piggyback:661.3] Out: 1275 [Urine:1275]  Intake/Output this shift: No intake/output data recorded.  Vent settings for last 24 hours: Vent Mode: PSV;CPAP FiO2 (%):  [40 %] 40 % Set Rate:  [20 bmp] 20 bmp PEEP:  [5 cmH20-8 cmH20] 8 cmH20 Pressure Support:  [6 cmH20] 6 cmH20  Physical Exam:  Gen: comfortable, no distress Neuro: follows commands, alert, communicative HEENT: PERRL Neck: supple CV: RRR Pulm: unlabored breathing on [place], trialing off bipap, less labored breathing than yest off bipap Abd: soft, NT , distended but improved, tympanitic still  GU: urine  anuric o/n Extr: wwp, no edema  Results for orders placed or performed during the hospital encounter of 07/04/23 (from the past 24 hour(s))  Glucose, capillary     Status: Abnormal   Collection Time: 07/08/23 11:38 AM  Result Value Ref Range   Glucose-Capillary 221 (H) 70 - 99 mg/dL  Glucose, capillary     Status: Abnormal   Collection Time: 07/08/23  3:44 PM  Result Value Ref Range   Glucose-Capillary 196 (H) 70 - 99 mg/dL  Basic metabolic panel     Status: Abnormal   Collection Time: 07/08/23  3:55 PM  Result Value Ref Range   Sodium 132 (L) 135 - 145 mmol/L   Potassium 5.2 (H) 3.5 - 5.1 mmol/L   Chloride 96 (L) 98 - 111 mmol/L   CO2 26 22 - 32 mmol/L   Glucose, Bld 184 (H) 70 - 99 mg/dL   BUN 42 (H) 8 - 23 mg/dL   Creatinine, Ser 8.29 0.61  - 1.24 mg/dL   Calcium 7.9 (L) 8.9 - 10.3 mg/dL   GFR, Estimated >56 >21 mL/min   Anion gap 10 5 - 15  I-STAT 7, (LYTES, BLD GAS, ICA, H+H)     Status: Abnormal   Collection Time: 07/08/23  5:58 PM  Result Value Ref Range   pH, Arterial 7.449 7.35 - 7.45   pCO2 arterial 37.8 32 - 48 mmHg   pO2, Arterial 68 (L) 83 - 108 mmHg   Bicarbonate 26.6 20.0 - 28.0 mmol/L   TCO2 28 22 - 32 mmol/L   O2 Saturation 95 %   Acid-Base Excess 2.0 0.0 - 2.0 mmol/L   Sodium 132 (L) 135 - 145 mmol/L   Potassium 5.0 3.5 - 5.1 mmol/L   Calcium, Ion 1.13 (L) 1.15 - 1.40 mmol/L   HCT 22.0 (L) 39.0 - 52.0 %   Hemoglobin 7.5 (L) 13.0 - 17.0 g/dL   Patient temperature 30.8 F    Sample type ARTERIAL   Glucose, capillary     Status: Abnormal   Collection Time: 07/08/23  7:49 PM  Result Value Ref Range   Glucose-Capillary 176 (H) 70 - 99 mg/dL  Glucose, capillary     Status: Abnormal   Collection Time: 07/08/23 11:51 PM  Result Value Ref Range   Glucose-Capillary 141 (H) 70 - 99  mg/dL  Glucose, capillary     Status: Abnormal   Collection Time: 07/09/23  3:29 AM  Result Value Ref Range   Glucose-Capillary 139 (H) 70 - 99 mg/dL  CBC     Status: Abnormal   Collection Time: 07/09/23  5:10 AM  Result Value Ref Range   WBC 16.4 (H) 4.0 - 10.5 K/uL   RBC 2.14 (L) 4.22 - 5.81 MIL/uL   Hemoglobin 6.4 (LL) 13.0 - 17.0 g/dL   HCT 16.1 (L) 09.6 - 04.5 %   MCV 94.4 80.0 - 100.0 fL   MCH 29.9 26.0 - 34.0 pg   MCHC 31.7 30.0 - 36.0 g/dL   RDW 40.9 81.1 - 91.4 %   Platelets 420 (H) 150 - 400 K/uL   nRBC 0.1 0.0 - 0.2 %  Basic metabolic panel     Status: Abnormal   Collection Time: 07/09/23  5:10 AM  Result Value Ref Range   Sodium 133 (L) 135 - 145 mmol/L   Potassium 5.3 (H) 3.5 - 5.1 mmol/L   Chloride 98 98 - 111 mmol/L   CO2 25 22 - 32 mmol/L   Glucose, Bld 158 (H) 70 - 99 mg/dL   BUN 47 (H) 8 - 23 mg/dL   Creatinine, Ser 7.82 0.61 - 1.24 mg/dL   Calcium 7.6 (L) 8.9 - 10.3 mg/dL   GFR, Estimated >95 >62  mL/min   Anion gap 10 5 - 15  Glucose, capillary     Status: Abnormal   Collection Time: 07/09/23  7:46 AM  Result Value Ref Range   Glucose-Capillary 151 (H) 70 - 99 mg/dL    Assessment & Plan: The plan of care was discussed with the bedside nurse for the day, Cara, who is in agreement with this plan and no additional concerns were raised.   Present on Admission:  Flail chest    LOS: 5 days   Additional comments:I reviewed the patient's new clinical lab test results.   and I reviewed the patients new imaging test results.    Syncopal fall   Syncope - echo completed, carotid US pending, EKG noted L rib fx 6-11 with flail segment - aggressive pulm toilet, epidural placed by Anesthesia, noted TCTS does not recommend plating Acute hypoxic respiratory failure - due to above, intermittent BiPAP yesterday, CPAP overnight, back to [place] this morning.  At this point is DNI.  Anticipate challenges liberating from vent.  Remains at high risk for respiratory decompensation. Improvement in air hunger with precedex, though dropped his BP, continue prn.  Acute blood loss anemia -  HGB slowly trending down last few days, CXR this morning reviewed, no clear drainable hemothorax, may benefit from repeat CT at some point - though transport with epidural could be difficult, will transfuse 2 u PRBC this morning and give a dose of lasix this afternoon, continue to monitor hgb  L temporal lobe arachnoid cyst - discussed with Dr. Franky Macho - no treatment needed Insomnia/delirium - maintain sleep wake cycle during the day, sleep aid FEN - NPO in case of resp decline, moved bowels yest, improved abdominal distention pushing up on chest. Lasix.  DVT - SCDs, SQH while epidural in place Dispo - ICU  Quentin Ore, MD   07/09/2023  *Care during the described time interval was provided by me. I have reviewed this patient's available data, including medical history, events of note, physical examination and test  results as part of my evaluation.

## 2023-07-09 NOTE — Plan of Care (Signed)
  Problem: Clinical Measurements: Goal: Will remain free from infection Outcome: Progressing   Problem: Coping: Goal: Level of anxiety will decrease Outcome: Progressing   

## 2023-07-09 NOTE — Plan of Care (Signed)
Progressing toward goals. Primary concerns for this shift are respiratory, sleep, safety, and pain management, and to remain free of aspiration. Day shift to further address issues with nutrition (no meals for over 24 hours) and issues related to immobility.

## 2023-07-09 DEATH — deceased

## 2023-07-10 LAB — GLUCOSE, CAPILLARY
Glucose-Capillary: 117 mg/dL — ABNORMAL HIGH (ref 70–99)
Glucose-Capillary: 126 mg/dL — ABNORMAL HIGH (ref 70–99)
Glucose-Capillary: 128 mg/dL — ABNORMAL HIGH (ref 70–99)
Glucose-Capillary: 139 mg/dL — ABNORMAL HIGH (ref 70–99)
Glucose-Capillary: 156 mg/dL — ABNORMAL HIGH (ref 70–99)
Glucose-Capillary: 174 mg/dL — ABNORMAL HIGH (ref 70–99)

## 2023-07-10 LAB — BASIC METABOLIC PANEL
Anion gap: 5 (ref 5–15)
BUN: 33 mg/dL — ABNORMAL HIGH (ref 8–23)
CO2: 26 mmol/L (ref 22–32)
Calcium: 7.6 mg/dL — ABNORMAL LOW (ref 8.9–10.3)
Chloride: 102 mmol/L (ref 98–111)
Creatinine, Ser: 0.9 mg/dL (ref 0.61–1.24)
GFR, Estimated: >60 mL/min (ref >60)
Glucose, Bld: 134 mg/dL — ABNORMAL HIGH (ref 70–99)
Potassium: 5.1 mmol/L (ref 3.5–5.1)
Sodium: 133 mmol/L — ABNORMAL LOW (ref 135–145)

## 2023-07-10 LAB — BPAM RBC
Blood Product Expiration Date: 202409042359
Blood Product Expiration Date: 202409042359
ISSUE DATE / TIME: 202409011156
ISSUE DATE / TIME: 202409011447
Unit Type and Rh: 9500
Unit Type and Rh: 9500

## 2023-07-10 LAB — TYPE AND SCREEN
ABO/RH(D): O NEG
Antibody Screen: NEGATIVE
Unit division: 0
Unit division: 0

## 2023-07-10 LAB — CBC
HCT: 26.9 % — ABNORMAL LOW (ref 39.0–52.0)
Hemoglobin: 8.3 g/dL — ABNORMAL LOW (ref 13.0–17.0)
MCH: 28.7 pg (ref 26.0–34.0)
MCHC: 30.9 g/dL (ref 30.0–36.0)
MCV: 93.1 fL (ref 80.0–100.0)
Platelets: 436 10*3/uL — ABNORMAL HIGH (ref 150–400)
RBC: 2.89 MIL/uL — ABNORMAL LOW (ref 4.22–5.81)
RDW: 17 % — ABNORMAL HIGH (ref 11.5–15.5)
WBC: 18 10*3/uL — ABNORMAL HIGH (ref 4.0–10.5)
nRBC: 0.4 % — ABNORMAL HIGH (ref 0.0–0.2)

## 2023-07-10 MED ORDER — LIDOCAINE 5 % EX PTCH
1.0000 | MEDICATED_PATCH | CUTANEOUS | Status: DC
  Administered 2023-07-10 – 2023-07-14 (×5): 1 via TRANSDERMAL
  Filled 2023-07-10 (×5): qty 1

## 2023-07-10 MED ORDER — SODIUM ZIRCONIUM CYCLOSILICATE 10 G PO PACK
10.0000 g | PACK | Freq: Once | ORAL | Status: AC
  Administered 2023-07-10: 10 g via ORAL
  Filled 2023-07-10: qty 1

## 2023-07-10 MED ORDER — FUROSEMIDE 10 MG/ML IJ SOLN
40.0000 mg | Freq: Once | INTRAMUSCULAR | Status: AC
  Administered 2023-07-10: 40 mg via INTRAVENOUS
  Filled 2023-07-10: qty 4

## 2023-07-10 NOTE — Progress Notes (Signed)
RT notified by RN at this time that patient is back on cpap, per patient request. Due to this RT holding on obtaining ABG per Dr.Lovick order to obtain ABG after 2-4 hours coming off bipap. Patient comfortable on cpap settings at this time. RN stated that he notified MD. RT will continue to monitor as needed.

## 2023-07-10 NOTE — Progress Notes (Signed)
Trauma/Critical Care Follow Up Note  Subjective:    Overnight Issues:   Objective:  Vital signs for last 24 hours: Temp:  [97 F (36.1 C)-99.2 F (37.3 C)] 99.1 F (37.3 C) (09/02 0400) Pulse Rate:  [88-106] 90 (09/02 0600) Resp:  [12-29] 12 (09/02 0600) BP: (88-181)/(54-110) 140/54 (09/02 0600) SpO2:  [98 %-100 %] 100 % (09/02 0600) Arterial Line BP: (69-164)/(54-61) 164/61 (09/01 1100) FiO2 (%):  [40 %] 40 % (09/01 2234) Weight:  [122.6 kg] 122.6 kg (09/02 0500)  Hemodynamic parameters for last 24 hours:    Intake/Output from previous day: 09/01 0701 - 09/02 0700 In: 1141.5 [I.V.:40; Blood:731; IV Piggyback:130.6] Out: 2400 [Urine:2400]  Intake/Output this shift: No intake/output data recorded.  Vent settings for last 24 hours: Vent Mode: CPAP FiO2 (%):  [40 %] 40 % PEEP:  [8 cmH20] 8 cmH20  Physical Exam:  Gen: comfortable, no distress Neuro: follows commands, alert, communicative HEENT: PERRL Neck: supple CV: RRR Pulm: unlabored breathing on [place] Abd: soft, NT , distended  GU: urine clear and yellow, +spontaneous voids Extr: wwp, no edema  Results for orders placed or performed during the hospital encounter of 07/04/23 (from the past 24 hour(s))  Prepare RBC (crossmatch)     Status: None   Collection Time: 07/09/23  8:57 AM  Result Value Ref Range   Order Confirmation      ORDER PROCESSED BY BLOOD BANK Performed at [facility], [address], [place], [place] [address]   Type and screen [facility]     Status: None (Preliminary result)   Collection Time: 07/09/23  9:51 AM  Result Value Ref Range   ABO/RH(D) O NEG    Antibody Screen NEG    Sample Expiration 07/12/2023,2359    Unit Number [id number]    Blood Component Type RED CELLS,LR    Unit division 00    Status of Unit ISSUED    Transfusion Status OK TO TRANSFUSE    Crossmatch Result      Compatible Performed at [facility], [address], [place],  [place] [address]    Unit Number [id number]    Blood Component Type RED CELLS,LR    Unit division 00    Status of Unit ISSUED    Transfusion Status OK TO TRANSFUSE    Crossmatch Result Compatible   Glucose, capillary     Status: Abnormal   Collection Time: 07/09/23 11:38 AM  Result Value Ref Range   Glucose-Capillary 124 (H) 70 - 99 mg/dL  Glucose, capillary     Status: Abnormal   Collection Time: 07/09/23  3:43 PM  Result Value Ref Range   Glucose-Capillary 140 (H) 70 - 99 mg/dL  Glucose, capillary     Status: Abnormal   Collection Time: 07/09/23  7:35 PM  Result Value Ref Range   Glucose-Capillary 165 (H) 70 - 99 mg/dL  Hemoglobin and hematocrit, blood     Status: Abnormal   Collection Time: 07/09/23  8:00 PM  Result Value Ref Range   Hemoglobin 9.2 (L) 13.0 - 17.0 g/dL   HCT 53.6 (L) 64.4 - 03.4 %  Glucose, capillary     Status: Abnormal   Collection Time: 07/09/23 11:31 PM  Result Value Ref Range   Glucose-Capillary 158 (H) 70 - 99 mg/dL  Glucose, capillary     Status: Abnormal   Collection Time: 07/10/23  3:37 AM  Result Value Ref Range   Glucose-Capillary 128 (H) 70 - 99  mg/dL  Basic metabolic panel     Status: Abnormal   Collection Time: 07/10/23  4:29 AM  Result Value Ref Range   Sodium 133 (L) 135 - 145 mmol/L   Potassium 5.1 3.5 - 5.1 mmol/L   Chloride 102 98 - 111 mmol/L   CO2 26 22 - 32 mmol/L   Glucose, Bld 134 (H) 70 - 99 mg/dL   BUN 33 (H) 8 - 23 mg/dL   Creatinine, Ser 2.70 0.61 - 1.24 mg/dL   Calcium 7.6 (L) 8.9 - 10.3 mg/dL   GFR, Estimated >35 >00 mL/min   Anion gap 5 5 - 15  CBC     Status: Abnormal   Collection Time: 07/10/23  4:29 AM  Result Value Ref Range   WBC 18.0 (H) 4.0 - 10.5 K/uL   RBC 2.89 (L) 4.22 - 5.81 MIL/uL   Hemoglobin 8.3 (L) 13.0 - 17.0 g/dL   HCT 93.8 (L) 18.2 - 99.3 %   MCV 93.1 80.0 - 100.0 fL   MCH 28.7 26.0 - 34.0 pg   MCHC 30.9 30.0 - 36.0 g/dL   RDW 71.6 (H) 96.7 - 89.3 %   Platelets 436 (H) 150 - 400 K/uL   nRBC 0.4  (H) 0.0 - 0.2 %  Glucose, capillary     Status: Abnormal   Collection Time: 07/10/23  7:37 AM  Result Value Ref Range   Glucose-Capillary 126 (H) 70 - 99 mg/dL    Assessment & Plan: The plan of care was discussed with the bedside nurse for the day, who is in agreement with this plan and no additional concerns were raised.   Present on Admission:  Flail chest    LOS: 6 days   Additional comments:I reviewed the patient's new clinical lab test results.   and I reviewed the patients new imaging test results.    Syncopal fall   Syncope - echo completed, carotid US pending, EKG noted L rib fx 6-11 with flail segment - aggressive pulm toilet, epidural placed by Anesthesia, noted TCTS does not recommend plating Acute hypoxic respiratory failure - due to above, intermittent BiPAP yesterday, CPAP overnight, back to  this morning and looks comfortable. Trial 2-4h off bipap with abg off bipap. Anticipate challenges liberating from vent. Remains at high risk for respiratory decompensation. Improvement in air hunger with precedex, though dropped his BP, continue prn.  Acute blood loss anemia -  hgb drifting, s/p 2u pRBC 9/1 with appropriate response, trend  L temporal lobe arachnoid cyst - discussed with Dr. Franky Macho - no treatment needed Insomnia/delirium - maintain sleep wake cycle during the day, sleep aid FEN - CLD in case of resp decline, but not hungry, consider cortrak in AM, improved abdominal distention pushing up on chest. Lasix.  DVT - SCDs, SQH while epidural in place Dispo - ICU  Critical Care Total Time: 35 minutes  Diamantina Monks, MD Trauma & General Surgery Please use [email or web address] to contact on call provider  07/10/2023  *Care during the described time interval was provided by me. I have reviewed this patient's available data, including medical history, events of note, physical examination and test results as part of my evaluation.

## 2023-07-10 NOTE — Addendum Note (Signed)
Addendum  created 07/10/23 1532 by Achille Rich, MD   Clinical Note Signed

## 2023-07-10 NOTE — Progress Notes (Signed)
OT Cancellation Note  Patient Details Name: Mario Knapp MRN: [id number] DOB: 01/07/1946   Cancelled Treatment:    Reason Eval/Treat Not Completed: Medical issues which prohibited therapy (Discussed with Nsg. Pt with decline in respiratory status and asking to hold today. will follow up on a later date.)  [facility] 07/10/2023, 9:17 AM Luisa Dago, OT/L   Acute OT Clinical Specialist Acute Rehabilitation Services Pager [phone] Office [phone]

## 2023-07-10 NOTE — Anesthesia Post-op Follow-up Note (Signed)
  Anesthesia Pain Follow-up Note  Patient: Mario Knapp  Day #: 5  Date of Follow-up: 07/10/2023 Time: 3:31 PM  Last Vitals:  Vitals:   07/10/23 1414 07/10/23 1500  BP:  (!) 153/80  Pulse: (!) 103 100  Resp: (!) 21 16  Temp:    SpO2: 100% 100%    Level of Consciousness: alert  Pain: mild   Side Effects:None  Catheter Site Exam:clean, dry, no drainage  Anti-Coag Meds (From admission, onward)    Start     Dose/Rate Route Frequency Ordered Stop   07/06/23 1400  heparin injection 5,000 Units        5,000 Units Subcutaneous Every 8 hours 07/06/23 1306        Epidural / Intrathecal (From admission, onward)    Start     Dose/Rate Route Frequency Ordered Stop   07/05/23 1145  ropivacaine (PF) 2 mg/mL (0.2%) (NAROPIN) injection        10 mL/hr 10 mL/hr  Epidural Continuous 07/05/23 1050          Plan: Continue current therapy of postop epidural at surgeon's request  Sigmond Patalano S

## 2023-07-10 NOTE — Progress Notes (Signed)
PT Cancellation Note  Patient Details Name: Mario Knapp MRN: [id number] DOB: [birth year]   Cancelled Treatment:    Reason Eval/Treat Not Completed: Medical issues which prohibited therapy; as per OT will cancel for today due to respiratory issues.  PT will continue to follow.   Elray Mcgregor 07/10/2023, 9:20 AM Sheran Lawless, PT Acute Rehabilitation Services Office:[phone] 07/10/2023

## 2023-07-11 ENCOUNTER — Inpatient Hospital Stay (HOSPITAL_COMMUNITY): Payer: PPO

## 2023-07-11 LAB — PHOSPHORUS: Phosphorus: 2.6 mg/dL (ref 2.5–4.6)

## 2023-07-11 LAB — CBC
HCT: 25.9 % — ABNORMAL LOW (ref 39.0–52.0)
Hemoglobin: 8 g/dL — ABNORMAL LOW (ref 13.0–17.0)
MCH: 29.7 pg (ref 26.0–34.0)
MCHC: 30.9 g/dL (ref 30.0–36.0)
MCV: 96.3 fL (ref 80.0–100.0)
Platelets: 458 10*3/uL — ABNORMAL HIGH (ref 150–400)
RBC: 2.69 MIL/uL — ABNORMAL LOW (ref 4.22–5.81)
RDW: 16.2 % — ABNORMAL HIGH (ref 11.5–15.5)
WBC: 18.9 10*3/uL — ABNORMAL HIGH (ref 4.0–10.5)
nRBC: 0 % (ref 0.0–0.2)

## 2023-07-11 LAB — BASIC METABOLIC PANEL
Anion gap: 7 (ref 5–15)
BUN: 26 mg/dL — ABNORMAL HIGH (ref 8–23)
CO2: 24 mmol/L (ref 22–32)
Calcium: 7.7 mg/dL — ABNORMAL LOW (ref 8.9–10.3)
Chloride: 101 mmol/L (ref 98–111)
Creatinine, Ser: 0.63 mg/dL (ref 0.61–1.24)
GFR, Estimated: >60 mL/min (ref >60)
Glucose, Bld: 143 mg/dL — ABNORMAL HIGH (ref 70–99)
Potassium: 4.9 mmol/L (ref 3.5–5.1)
Sodium: 132 mmol/L — ABNORMAL LOW (ref 135–145)

## 2023-07-11 LAB — MAGNESIUM: Magnesium: 2.2 mg/dL (ref 1.7–2.4)

## 2023-07-11 LAB — GLUCOSE, CAPILLARY
Glucose-Capillary: 131 mg/dL — ABNORMAL HIGH (ref 70–99)
Glucose-Capillary: 136 mg/dL — ABNORMAL HIGH (ref 70–99)
Glucose-Capillary: 142 mg/dL — ABNORMAL HIGH (ref 70–99)
Glucose-Capillary: 150 mg/dL — ABNORMAL HIGH (ref 70–99)
Glucose-Capillary: 153 mg/dL — ABNORMAL HIGH (ref 70–99)
Glucose-Capillary: 157 mg/dL — ABNORMAL HIGH (ref 70–99)

## 2023-07-11 MED ORDER — HEPARIN SODIUM (PORCINE) 5000 UNIT/ML IJ SOLN
5000.0000 [IU] | Freq: Three times a day (TID) | INTRAMUSCULAR | Status: DC
  Administered 2023-07-12 – 2023-07-14 (×7): 5000 [IU] via SUBCUTANEOUS
  Filled 2023-07-11 (×7): qty 1

## 2023-07-11 MED ORDER — VITAL 1.5 CAL PO LIQD
1000.0000 mL | ORAL | Status: DC
  Administered 2023-07-11: 1000 mL

## 2023-07-11 MED ORDER — PROSOURCE TF20 ENFIT COMPATIBL EN LIQD
60.0000 mL | Freq: Every day | ENTERAL | Status: DC
  Administered 2023-07-11 – 2023-07-14 (×4): 60 mL
  Filled 2023-07-11 (×5): qty 60

## 2023-07-11 MED ORDER — VITAL HIGH PROTEIN PO LIQD: 1000.0000 mL | ORAL | Status: DC

## 2023-07-11 NOTE — Anesthesia Post-op Follow-up Note (Signed)
  Anesthesia Pain Follow-up Note  Patient: Mario Knapp  Day #: 6  Date of Follow-up: 07/11/2023 Time: 12:33 PM  Last Vitals:  Vitals:   07/11/23 1000 07/11/23 1100  BP: 121/63 (!) 128/59  Pulse: 93 96  Resp: 15 14  Temp:    SpO2: 100% 100%    Level of Consciousness: alert  Pain: none   Side Effects:None  Catheter Site Exam:clean, dry, no drainage  Anti-Coag Meds (From admission, onward)    Start     Dose/Rate Route Frequency Ordered Stop   07/06/23 1400  heparin injection 5,000 Units        5,000 Units Subcutaneous Every 8 hours 07/06/23 1306        Epidural / Intrathecal (From admission, onward)    Start     Dose/Rate Route Frequency Ordered Stop   07/05/23 1145  ropivacaine (PF) 2 mg/mL (0.2%) (NAROPIN) injection        10 mL/hr 10 mL/hr  Epidural Continuous 07/05/23 1050          Plan: Catheter removed/tip intact at surgeon's request  Rossie Bretado P Alisea Matte

## 2023-07-11 NOTE — Progress Notes (Signed)
Initial Nutrition Assessment  DOCUMENTATION CODES:   Obesity unspecified  INTERVENTION:  Initiate trickle tube feeding via Cortrak : Vital 1.5 at 80ml/hr  Once able to advance, recommend: Advancing by 10ml q8h to goal rate of 30ml/hr ( per day) Prosource TF20 60 ml once daily  Goal tube feeding regimen provides 2060 kcal, 109 gm protein, 1008 ml free water daily  NUTRITION DIAGNOSIS:   Inadequate oral intake related to acute illness as evidenced by NPO status.  GOAL:   Patient will meet greater than or equal to 90% of their needs  MONITOR:   Labs, Weight trends, TF tolerance, I & O's  REASON FOR ASSESSMENT:   Consult Enteral/tube feeding initiation and management  ASSESSMENT:   Pt admitted after a syncopal event leading to L rib fx 6/11 with flail segment. PMH significant  for OSA, morbid obesity, HLD, HTN, arthritis, DM2.  8/30 - NPO d/t concerns for respiratory decline 8/31 - s/p abd xray, gaseous distension of large/small bowel suggestive of ileus or distal obstruction 9/3 - Cortrak placed (tip post-pyloric)  CT surgery recommending conservative management of rib fractures.   Pt's daughter present at bedside assisted with nutrition related history as pt on BiPAP, Cortrak being placed at time of visit.   His daughter reports that he has had a good appetite up until about 4 days ago and has not eaten since then. She reports that he has had his esophagus stretched about 4 years ago.  Does not feel that this has affected his intake recently.   Reviewed weight history. Since December, pt's weight has increased 14.6 kg.   Medications: colace, SSI 0-20 units q4h, semglee 10 units daily, melatonin, reglan q6h, miralax, precedex  Labs: sodium 132, BUN 26, CBG's 117-174 x24 hours  UOP: x24 hours I/O's:-4859ml since admit  NUTRITION - FOCUSED PHYSICAL EXAM:  Flowsheet Row Most Recent Value  Orbital Region Mild depletion  Upper Arm Region No depletion   Thoracic and Lumbar Region No depletion  [significant bruising on L thoracic region]  Buccal Region Mild depletion  Temple Region No depletion  Clavicle Bone Region No depletion  Clavicle and Acromion Bone Region No depletion  Scapular Bone Region No depletion  Dorsal Hand No depletion  Patellar Region No depletion  Anterior Thigh Region No depletion  Posterior Calf Region No depletion  Edema (RD Assessment) None  Hair Reviewed  Eyes Reviewed  Mouth Other (Comment)  [edentulous]  Skin Reviewed  Nails Reviewed       Diet Order:   Diet Order             Diet NPO time specified Except for: Sips with Meds, Ice Chips  Diet effective now                   EDUCATION NEEDS:   Education needs have been addressed  Skin:  Skin Assessment: Reviewed RN Assessment  Last BM:  8/30 - distended, taut  Height:   Ht Readings from Last 1 Encounters:  07/04/23 5\' 10"  (1.778 m)    Weight:   Wt Readings from Last 1 Encounters:  07/11/23 124.8 kg    Ideal Body Weight:  75.5 kg  BMI:  Body mass index is 39.48 kg/m.  Estimated Nutritional Needs:   Kcal:  1900-2100  Protein:  100-115g  Fluid:  >/=2L  Drusilla Kanner, RDN, LDN Clinical Nutrition

## 2023-07-11 NOTE — Progress Notes (Signed)
Dr. Janee Morn gave verbal confirmation that the patient was able to get up to the chair today.  RN assissted patient and he sat in the chair for an hour.  Patient also wore venturi mask for an hour.  At this poiint patient requested to be put back in bed and placed back on the CPAP.  This was done without any complications.

## 2023-07-11 NOTE — Progress Notes (Addendum)
Patient ID: Mario Knapp, male   DOB: 28-Feb-1946, 77 y.o.   MRN: [id number] Follow up - Trauma Critical Care   Patient Details:    Mario Knapp is an 77 y.o. male.  Lines/tubes : PICC Triple Lumen 07/08/23 Right Basilic 42 cm 0 cm (Active)  Indication for Insertion or Continuance of Line Limited venous access - need for IV therapy >5 days (PICC only) 07/10/23 2000  Exposed Catheter (cm) 0 cm 07/10/23 1819  Site Assessment Clean, Dry, Intact 07/10/23 2000  Lumen #1 Status In-line blood sampling system in place 07/10/23 2000  Lumen #2 Status Saline locked;Flushed 07/10/23 2000  Lumen #3 Status Infusing;Flushed 07/10/23 2000  Dressing Type Transparent 07/10/23 2000  Dressing Status Antimicrobial disc in place 07/10/23 2000  Line Care Connections checked and tightened 07/10/23 2000  Line Adjustment (NICU/IV Team Only) No 07/08/23 0830  Dressing Intervention New dressing 07/08/23 0830  Dressing Change Due  07/10/23 2000     Epidural Catheter 07/06/23 (Active)  Site Assessment Clean, Dry, Intact 07/11/23 0000  Line Status Infusing 07/11/23 0000  Dressing Type Transparent 07/11/23 0000  Dressing Status Old drainage 07/11/23 0000  Dressing Change Due 07/13/23 07/10/23 2000     External Urinary Catheter (Active)  Dedicated Suction Verified suction is between 40-80 mmHg 07/11/23 0751  Site Assessment Clean, Dry, Intact 07/11/23 0751  Intervention No interventions needed at this time 07/11/23 0751  Output (mL) 800 mL 07/11/23 0351    Microbiology/Sepsis markers: Results for orders placed or performed during the hospital encounter of 07/04/23  MRSA Next Gen by PCR, Nasal     Status: None   Collection Time: 07/04/23  1:01 AM   Specimen: Nasal Mucosa; Nasal Swab  Result Value Ref Range Status   MRSA by PCR Next Gen NOT DETECTED NOT DETECTED Final    Comment: (NOTE) The GeneXpert MRSA Assay (FDA approved for NASAL specimens only), is one component of a comprehensive  MRSA colonization surveillance program. It is not intended to diagnose MRSA infection nor to guide or monitor treatment for MRSA infections. Test performance is not FDA approved in patients less than 29 years old. Performed at [facility], [address], [place], [place] [address]     Anti-infectives:  Anti-infectives (From admission, onward)    None      Consults: Treatment Team:  Lovett Sox, MD    Studies:    Events:  Subjective:    Overnight Issues:  Just placed on FM O2, no BM yesterday Objective:  Vital signs for last 24 hours: Temp:  [97.6 F (36.4 C)-99 F (37.2 C)] 98.1 F (36.7 C) (09/03 0400) Pulse Rate:  [82-105] 97 (09/03 0700) Resp:  [12-24] 15 (09/03 0700) BP: (93-162)/(54-116) 139/58 (09/03 0700) SpO2:  [99 %-100 %] 100 % (09/03 0803) FiO2 (%):  [40 %] 40 % (09/03 0803) Weight:  [124.8 kg] 124.8 kg (09/03 0500)  Hemodynamic parameters for last 24 hours:    Intake/Output from previous day: 09/02 0701 - 09/03 0700 In: 513 [I.V.:48.8; IV Piggyback:214.3] Out: 3210 [Urine:3210]  Intake/Output this shift: No intake/output data recorded.  Vent settings for last 24 hours: Vent Mode: CPAP FiO2 (%):  [40 %] 40 % PEEP:  [8 cmH20] 8 cmH20  Physical Exam:  General: alert and no respiratory distress Neuro: alert and F/C HEENT/Neck: FM O2 Resp: clear to auscultation bilaterally CVS: RRR, L chest contusion - large GI: softer, NT, mild dist Extremities: calves soft  Results for orders placed or performed  during the hospital encounter of 07/04/23 (from the past 24 hour(s))  Glucose, capillary     Status: Abnormal   Collection Time: 07/10/23 12:34 PM  Result Value Ref Range   Glucose-Capillary 156 (H) 70 - 99 mg/dL  Glucose, capillary     Status: Abnormal   Collection Time: 07/10/23  3:58 PM  Result Value Ref Range   Glucose-Capillary 174 (H) 70 - 99 mg/dL  Glucose, capillary     Status: Abnormal   Collection Time: 07/10/23  7:38  PM  Result Value Ref Range   Glucose-Capillary 139 (H) 70 - 99 mg/dL  Glucose, capillary     Status: Abnormal   Collection Time: 07/10/23 11:35 PM  Result Value Ref Range   Glucose-Capillary 117 (H) 70 - 99 mg/dL  Basic metabolic panel     Status: Abnormal   Collection Time: 07/11/23  3:00 AM  Result Value Ref Range   Sodium 132 (L) 135 - 145 mmol/L   Potassium 4.9 3.5 - 5.1 mmol/L   Chloride 101 98 - 111 mmol/L   CO2 24 22 - 32 mmol/L   Glucose, Bld 143 (H) 70 - 99 mg/dL   BUN 26 (H) 8 - 23 mg/dL   Creatinine, Ser 7.82 0.61 - 1.24 mg/dL   Calcium 7.7 (L) 8.9 - 10.3 mg/dL   GFR, Estimated >95 >62 mL/min   Anion gap 7 5 - 15  Glucose, capillary     Status: Abnormal   Collection Time: 07/11/23  3:57 AM  Result Value Ref Range   Glucose-Capillary 136 (H) 70 - 99 mg/dL  Glucose, capillary     Status: Abnormal   Collection Time: 07/11/23  7:52 AM  Result Value Ref Range   Glucose-Capillary 142 (H) 70 - 99 mg/dL    Assessment & Plan: Present on Admission:  Flail chest    LOS: 7 days   Additional comments:I reviewed the patient's new clinical lab test results. / Syncopal fall   Syncope - echo completed, carotid US pending, EKG noted L rib fx 6-11 with flail segment - aggressive pulm toilet, epidural placed by Anesthesia, noted TCTS does not recommend plating Acute hypoxic respiratory failure - BiPAP overnight, placed on FM this AM and doing well so far, Precedex helping, epidural is day 6 so I spoke with Anesthesia about removing today Acute blood loss anemia -  2u pRBC 9/1, CBC P now L temporal lobe arachnoid cyst - discussed with Dr. Franky Macho - no treatment needed Insomnia/delirium - maintain sleep wake cycle during the day, sleep aid FEN - diuresed yesterday, hyperkalemia improved, will place cortrak for nutrition today DVT - SCDs, hold SQH for anticipated epidural removal Dispo - ICU, got up to the chair II spoke with his daughter at the bedside.  Critical Care Total  Time*: 35 Minutes  Violeta Gelinas, MD, MPH, FACS Trauma & General Surgery Use [email or web address] to contact on call provider  07/11/2023  *Care during the described time interval was provided by me. I have reviewed this patient's available data, including medical history, events of note, physical examination and test results as part of my evaluation.

## 2023-07-11 NOTE — Progress Notes (Signed)
OT Cancellation Note  Patient Details Name: Mario Knapp MRN: [id number] DOB: 1945-12-10   Cancelled Treatment:    Reason Eval/Treat Not Completed: Medical issues which prohibited therapy- pt on bipap, cortrak just placed, and pt resting at this time.  OT will follow up and see as able later.    Barry Brunner, OT Acute Rehabilitation Services Office [phone]   Chancy Milroy 07/11/2023, 11:13 AM

## 2023-07-11 NOTE — Addendum Note (Signed)
Addendum  created 07/11/23 1234 by Leonides Grills, MD   Clinical Note Signed, LDA properties accepted

## 2023-07-11 NOTE — Progress Notes (Signed)
  Progress Note   Date: 07/11/2023  Patient Name: Mario Knapp        MRN#: [id number]  Review the patient's clinical findings supports the diagnosis of:   Hyponatremia

## 2023-07-11 NOTE — Procedures (Signed)
Cortrak  Person Inserting Tube:  Clovis Riley, Makayla L, RD Tube Type:  Cortrak - 55 inches Tube Size:  10 Tube Location:  Left nare Secured by: Bridle Technique Used to Measure Tube Placement:  Marking at nare/corner of mouth Cortrak Secured At:  100 cm   Cortrak Tube Team Note:  Consult received to place a Cortrak feeding tube.   X-ray is required, abdominal x-ray has been ordered by the Cortrak team. Please confirm tube placement before using the Cortrak tube.   If the tube becomes dislodged please keep the tube and contact the Cortrak team at [email or web address] for replacement.  If after hours and replacement cannot be delayed, place a NG tube and confirm placement with an abdominal x-ray.    Kirby Crigler RD, LDN Clinical Dietitian See Loretha Stapler for contact information.

## 2023-07-11 NOTE — Progress Notes (Signed)
PT Cancellation Note  Patient Details Name: Mario Knapp MRN: [id number] DOB: Jun 02, 1946   Cancelled Treatment:    Reason Eval/Treat Not Completed: Medical issues which prohibited therapy (Pt on BiPap and cortrak recently placed and medicated for pain. will follow up at later date/time as pt able and schedule allows.)   Renaldo Fiddler PT, DPT Acute Rehabilitation Services Office [phone]  07/11/23 11:16 AM

## 2023-07-12 LAB — GLUCOSE, CAPILLARY
Glucose-Capillary: 159 mg/dL — ABNORMAL HIGH (ref 70–99)
Glucose-Capillary: 164 mg/dL — ABNORMAL HIGH (ref 70–99)
Glucose-Capillary: 168 mg/dL — ABNORMAL HIGH (ref 70–99)
Glucose-Capillary: 182 mg/dL — ABNORMAL HIGH (ref 70–99)
Glucose-Capillary: 191 mg/dL — ABNORMAL HIGH (ref 70–99)
Glucose-Capillary: 217 mg/dL — ABNORMAL HIGH (ref 70–99)

## 2023-07-12 LAB — CBC
HCT: 25.3 % — ABNORMAL LOW (ref 39.0–52.0)
Hemoglobin: 7.9 g/dL — ABNORMAL LOW (ref 13.0–17.0)
MCH: 29.2 pg (ref 26.0–34.0)
MCHC: 31.2 g/dL (ref 30.0–36.0)
MCV: 93.4 fL (ref 80.0–100.0)
Platelets: 463 10*3/uL — ABNORMAL HIGH (ref 150–400)
RBC: 2.71 MIL/uL — ABNORMAL LOW (ref 4.22–5.81)
RDW: 15.7 % — ABNORMAL HIGH (ref 11.5–15.5)
WBC: 17.2 10*3/uL — ABNORMAL HIGH (ref 4.0–10.5)
nRBC: 0 % (ref 0.0–0.2)

## 2023-07-12 LAB — POCT I-STAT 7, (LYTES, BLD GAS, ICA,H+H)
Acid-base deficit: 1 mmol/L (ref 0.0–2.0)
Bicarbonate: 24.6 mmol/L (ref 20.0–28.0)
Calcium, Ion: 1.21 mmol/L (ref 1.15–1.40)
HCT: 24 % — ABNORMAL LOW (ref 39.0–52.0)
Hemoglobin: 8.2 g/dL — ABNORMAL LOW (ref 13.0–17.0)
O2 Saturation: 95 %
Patient temperature: 98.6
Potassium: 5.3 mmol/L — ABNORMAL HIGH (ref 3.5–5.1)
Sodium: 132 mmol/L — ABNORMAL LOW (ref 135–145)
TCO2: 26 mmol/L (ref 22–32)
pCO2 arterial: 41.6 mmHg (ref 32–48)
pH, Arterial: 7.38 (ref 7.35–7.45)
pO2, Arterial: 79 mmHg — ABNORMAL LOW (ref 83–108)

## 2023-07-12 LAB — BASIC METABOLIC PANEL
Anion gap: 9 (ref 5–15)
BUN: 23 mg/dL (ref 8–23)
CO2: 23 mmol/L (ref 22–32)
Calcium: 7.8 mg/dL — ABNORMAL LOW (ref 8.9–10.3)
Chloride: 99 mmol/L (ref 98–111)
Creatinine, Ser: 0.59 mg/dL — ABNORMAL LOW (ref 0.61–1.24)
GFR, Estimated: >60 mL/min (ref >60)
Glucose, Bld: 185 mg/dL — ABNORMAL HIGH (ref 70–99)
Potassium: 5 mmol/L (ref 3.5–5.1)
Sodium: 131 mmol/L — ABNORMAL LOW (ref 135–145)

## 2023-07-12 LAB — MAGNESIUM
Magnesium: 2 mg/dL (ref 1.7–2.4)
Magnesium: 2 mg/dL (ref 1.7–2.4)

## 2023-07-12 LAB — PHOSPHORUS
Phosphorus: 2.8 mg/dL (ref 2.5–4.6)
Phosphorus: 3.1 mg/dL (ref 2.5–4.6)

## 2023-07-12 MED ORDER — GUAIFENESIN 100 MG/5ML PO LIQD
15.0000 mL | ORAL | Status: DC
  Administered 2023-07-12 – 2023-07-13 (×8): 15 mL via ORAL
  Filled 2023-07-12 (×9): qty 15

## 2023-07-12 MED ORDER — VITAL 1.5 CAL PO LIQD
1000.0000 mL | ORAL | Status: DC
  Administered 2023-07-12 – 2023-07-14 (×3): 1000 mL

## 2023-07-12 MED ORDER — MAGNESIUM CITRATE PO SOLN
1.0000 | Freq: Once | ORAL | Status: AC
  Administered 2023-07-12: 1 via ORAL
  Filled 2023-07-12: qty 296

## 2023-07-12 MED ORDER — TRAMADOL HCL 50 MG PO TABS
100.0000 mg | ORAL_TABLET | Freq: Four times a day (QID) | ORAL | Status: DC
  Administered 2023-07-12 – 2023-07-15 (×12): 100 mg via ORAL
  Filled 2023-07-12 (×13): qty 2

## 2023-07-12 MED ORDER — ALTEPLASE 2 MG IJ SOLR: 2.0000 mg | Freq: Once | INTRAMUSCULAR | Status: DC

## 2023-07-12 MED ORDER — SODIUM ZIRCONIUM CYCLOSILICATE 10 G PO PACK
10.0000 g | PACK | Freq: Once | ORAL | Status: AC
  Administered 2023-07-12: 10 g via ORAL
  Filled 2023-07-12: qty 1

## 2023-07-12 MED ORDER — MAGNESIUM HYDROXIDE 400 MG/5ML PO SUSP
30.0000 mL | Freq: Once | ORAL | Status: DC
  Filled 2023-07-12 (×2): qty 30

## 2023-07-12 MED ORDER — BISACODYL 10 MG RE SUPP: 10.0000 mg | Freq: Once | RECTAL | Status: DC

## 2023-07-12 MED ORDER — METHOCARBAMOL 500 MG PO TABS
750.0000 mg | ORAL_TABLET | Freq: Three times a day (TID) | ORAL | Status: DC
  Administered 2023-07-12 – 2023-07-13 (×6): 750 mg via ORAL
  Filled 2023-07-12 (×6): qty 2

## 2023-07-12 MED ORDER — IBUPROFEN 200 MG PO TABS
600.0000 mg | ORAL_TABLET | Freq: Four times a day (QID) | ORAL | Status: DC
  Administered 2023-07-12 – 2023-07-15 (×11): 600 mg via ORAL
  Filled 2023-07-12 (×11): qty 3

## 2023-07-12 MED ORDER — TRAZODONE HCL 100 MG PO TABS
100.0000 mg | ORAL_TABLET | Freq: Every evening | ORAL | Status: DC | PRN
  Administered 2023-07-12 – 2023-07-14 (×3): 100 mg via ORAL
  Filled 2023-07-12: qty 2
  Filled 2023-07-12: qty 1
  Filled 2023-07-12: qty 2

## 2023-07-12 MED ORDER — ALTEPLASE 2 MG IJ SOLR
2.0000 mg | Freq: Once | INTRAMUSCULAR | Status: AC | PRN
  Administered 2023-07-12: 2 mg
  Filled 2023-07-12: qty 2

## 2023-07-12 MED ORDER — BUSPIRONE HCL 5 MG PO TABS
10.0000 mg | ORAL_TABLET | Freq: Three times a day (TID) | ORAL | Status: DC
  Administered 2023-07-12 – 2023-07-15 (×9): 10 mg via ORAL
  Filled 2023-07-12 (×4): qty 1
  Filled 2023-07-12 (×2): qty 2
  Filled 2023-07-12: qty 1
  Filled 2023-07-12: qty 2
  Filled 2023-07-12 (×3): qty 1

## 2023-07-12 MED ORDER — SENNA 8.6 MG PO TABS
2.0000 | ORAL_TABLET | Freq: Once | ORAL | Status: AC
  Administered 2023-07-12: 17.2 mg via ORAL
  Filled 2023-07-12: qty 2

## 2023-07-12 MED ORDER — FUROSEMIDE 10 MG/ML IJ SOLN
40.0000 mg | Freq: Once | INTRAMUSCULAR | Status: AC
  Administered 2023-07-12: 40 mg via INTRAVENOUS
  Filled 2023-07-12: qty 4

## 2023-07-12 MED ORDER — BISACODYL 5 MG PO TBEC
10.0000 mg | DELAYED_RELEASE_TABLET | Freq: Once | ORAL | Status: AC
  Administered 2023-07-12: 10 mg via ORAL
  Filled 2023-07-12: qty 2

## 2023-07-12 NOTE — Progress Notes (Signed)
Physical Therapy Treatment Patient Details Name: Jimmye Maule MRN: [id number] DOB: Aug 13, 1946 Today's Date: 07/12/2023   History of Present Illness Pt is 77 yo male who presents on 07/03/23 after a syncopal episode at home. Pt with 2 week hx of L sided chest pain, on abx from urgent care. Found with L fib fx 6-11 with flail segment. Complicated by acute hypoxic respiratory failure.  PMH includes: arthritis, depression, HTN, sleep apnea.    PT Comments  Treated pt in conjunction with OT per RN's recommendations based on pt's decreased activity tolerance and increased need for assistance lately. Pt is currently limited by his deficits in aerobic and muscular endurance and orthostatic hypotension. See below. Pt needs encouragement to try to progress his mobility, seemingly anxious and needing cues for breathing technique throughout. At this time, pt is needing CGA-minAx2 to transfer to stand and CGA-minA (+2 for safety) to ambulate short bedroom distances with a RW for support. He remains at risk for falls due to deficits listed above. Educated pt on importance of frequent mobility and sitting upright to improve BP and endurance. Updated recs to HHPT. Will continue to follow acutely.   SpO2 >/= 97% 4 L at rest and up to 6L during activity with venturi mask; RR in 20s-30s.   BP:  132/92 reclined start of session 112/91 sitting after first standing bout 133/81 sitting after resting 111/88 sitting after second standing bout 95/81 sitting on commode after ambulating ~6 ft 104/79 once transferred to recliner 116/69 reclined end of session  *pt reporting feeling lightheaded with activity    If plan is discharge home, recommend the following: Help with stairs or ramp for entrance;Assist for transportation;Assistance with cooking/housework;A little help with walking and/or transfers;A little help with bathing/dressing/bathroom   Can travel by private vehicle        Equipment Recommendations   Rollator (4 wheels) (bariatric)    Recommendations for Other Services       Precautions / Restrictions Precautions Precautions: Fall Precaution Comments: watch vitals; orthostatic hypotension 9/4 Restrictions Weight Bearing Restrictions: No     Mobility  Bed Mobility               General bed mobility comments: OOB in recliner    Transfers Overall transfer level: Needs assistance Equipment used: Rolling walker (2 wheels) Transfers: Sit to/from Stand, Bed to chair/wheelchair/BSC Sit to Stand: Min assist, +2 physical assistance, +2 safety/equipment, Contact guard assist   Step pivot transfers: Min assist, +2 safety/equipment       General transfer comment: initally min assist to power up and steady, fading to CGA for 2nd stand, but minA again to stand the third rep with noted decreased initiation by pt    Ambulation/Gait Ambulation/Gait assistance: Min assist, Contact guard assist, +2 safety/equipment Gait Distance (Feet): 6 Feet (x2 bouts of ~6 ft > ~2 ft) Assistive device: Rolling walker (2 wheels) Gait Pattern/deviations: Step-through pattern, Decreased stride length Gait velocity: decreased Gait velocity interpretation: <1.31 ft/sec, indicative of household ambulator   General Gait Details: Pt takes slow, small steps within room. Limited distance today due to symptomatic orthostatic hypotension that worsened with attempts   Stairs             Wheelchair Mobility     Tilt Bed    Modified Rankin (Stroke Patients Only)       Balance Overall balance assessment: Needs assistance Sitting-balance support: No upper extremity supported, Feet supported Sitting balance-Leahy Scale: Fair Sitting balance - Comments: statically  Standing balance support: Bilateral upper extremity supported, During functional activity Standing balance-Leahy Scale: Poor Standing balance comment: relies on BUE and external support                             Cognition Arousal: Alert Behavior During Therapy: Flat affect Overall Cognitive Status: Impaired/Different from baseline Area of Impairment: Problem solving, Memory, Following commands, Awareness                     Memory: Decreased short-term memory Following Commands: Follows one step commands consistently, Follows one step commands with increased time, Follows multi-step commands inconsistently   Awareness: Emergent Problem Solving: Slow processing, Requires verbal cues, Difficulty sequencing General Comments: pt oriented and following commands, some slow processing and decreased recall noted; needs encouragement to try to progress mobility, seemingly anxious; continue assessment        Exercises      General Comments General comments (skin integrity, edema, etc.): SpO2 >/= 97% 4 L at rest and up to 6L during activity with venturi mask; RR in 20s-30s. BP monitored throughout session with BP 132/92 reclined start of session, 112/91 sitting after first standing bout, 133/81 sitting after resting, 111/88 sitting after second standing bout, 95/81 sitting on commode after ambulating ~6 ft, 104/79 once transferred to recliner, 116/69 reclined end of session; pt reporting feeling lightheaded with activity; educated pt on importance of frequent mobility and sitting upright to improve BP and endurance      Pertinent Vitals/Pain Pain Assessment Pain Assessment: Faces Faces Pain Scale: Hurts little more Pain Location: abdomen, L side Pain Descriptors / Indicators: Discomfort, Grimacing, Guarding Pain Intervention(s): Monitored during session, Limited activity within patient's tolerance, Repositioned    Home Living                          Prior Function            PT Goals (current goals can now be found in the care plan section) Acute Rehab PT Goals Patient Stated Goal: return home PT Goal Formulation: With patient/family Time For Goal Achievement:  07/18/23 Potential to Achieve Goals: Good Progress towards PT goals: Progressing toward goals (slowly)    Frequency    Min 1X/week      PT Plan      Co-evaluation PT/OT/SLP Co-Evaluation/Treatment: Yes Reason for Co-Treatment: To address functional/ADL transfers;For patient/therapist safety;Other (comment) (activity tolerance) PT goals addressed during session: Mobility/safety with mobility;Balance;Proper use of DME OT goals addressed during session: ADL's and self-care      AM-PAC PT "6 Clicks" Mobility   Outcome Measure  Help needed turning from your back to your side while in a flat bed without using bedrails?: None Help needed moving from lying on your back to sitting on the side of a flat bed without using bedrails?: A Little Help needed moving to and from a bed to a chair (including a wheelchair)?: A Little Help needed standing up from a chair using your arms (e.g., wheelchair or bedside chair)?: A Lot Help needed to walk in hospital room?: Total (<20 ft) Help needed climbing 3-5 steps with a railing? : Total 6 Click Score: 14    End of Session Equipment Utilized During Treatment: Gait belt;Oxygen Activity Tolerance: Other (comment) (limited by drop in BP) Patient left: in chair;with call bell/phone within reach;with family/visitor present Nurse Communication: Mobility status;Other (comment) (BP, sats) PT Visit Diagnosis: Unsteadiness  on feet (R26.81);Difficulty in walking, not elsewhere classified (R26.2);Pain;Other abnormalities of gait and mobility (R26.89);Dizziness and giddiness (R42) Pain - Right/Left: Left Pain - part of body:  (ribs)     Time: 4098-1191 PT Time Calculation (min) (ACUTE ONLY): 34 min  Charges:    $Therapeutic Activity: 8-22 mins PT General Charges $$ ACUTE PT VISIT: 1 Visit                     [name], PT, DPT Acute Rehabilitation Services  Office: [phone]    [name] 07/12/2023, 12:46 PM

## 2023-07-12 NOTE — Progress Notes (Addendum)
Occupational Therapy Treatment Patient Details Name: Mario Knapp MRN: [id number] DOB: [birth year] Today's Date: 07/12/2023   History of present illness Pt is 77 yo male who presents on 07/03/23 after a syncopal episode at home. Pt with 2 week hx of L sided chest pain, on abx from urgent care. Found with L fib fx 6-11 with flail segment. Complicated by acute hypoxic respiratory failure.  PMH includes: arthritis, depression, HTN, sleep apnea.   OT comments  Patient in recliner and agreeable to OT/PT with encouragement.  He is on 4L at rest, up to 6L during activity with Spo2 maintained and RR in 20-30s. He reports feeling "whirly" in standing, transferred x 2 from recliner and ambulated several steps to [facility].  Mod assist for toileting, max assist for LB dressing. BP monitored throughout session, see below for details. Encouraged upright position throughout the day, as pt tends to recline back even up in recliner. Will follow acutely, updated dc plan to [facility] services at this time.    BP sitting 130s/90s  BP standing 110s/80/90 BP sitting on BSC 95/81  BP in recliner at end of session 116/69 with legs elevated       If plan is discharge home, recommend the following:  A little help with walking and/or transfers;Assist for transportation;Assistance with cooking/housework;A lot of help with bathing/dressing/bathroom;Direct supervision/assist for medications management;Direct supervision/assist for financial management;Help with stairs or ramp for entrance   Equipment Recommendations  BSC/3in1;Other (comment) (RW)    Recommendations for Other Services      Precautions / Restrictions Precautions Precautions: Fall Precaution Comments: watch vitals; orthostatic hypotension 9/4 Restrictions Weight Bearing Restrictions: No       Mobility Bed Mobility               General bed mobility comments: OOB in recliner    Transfers Overall transfer level: Needs assistance Equipment used:  Rolling walker (2 wheels) Transfers: Sit to/from Stand Sit to Stand: Min assist, +2 physical assistance, +2 safety/equipment, Contact guard assist           General transfer comment: initally min assist to power up and steady, fading to min guard for 2nd stand     Balance Overall balance assessment: Needs assistance Sitting-balance support: No upper extremity supported, Feet supported Sitting balance-Leahy Scale: Fair Sitting balance - Comments: statically   Standing balance support: Bilateral upper extremity supported, During functional activity Standing balance-Leahy Scale: Poor Standing balance comment: relies on BUE and external support                           ADL either performed or assessed with clinical judgement   ADL Overall ADL's : Needs assistance/impaired     Grooming: Set up;Sitting               Lower Body Dressing: Maximal assistance;Sit to/from stand Lower Body Dressing Details (indicate cue type and reason): requires assist for socks, min assist +2 in standing and relies on support to manage clothing over hips Toilet Transfer: Minimal assistance;+2 for safety/equipment;Ambulation;Rolling walker (2 wheels);BSC/3in1 Toilet Transfer Details (indicate cue type and reason): to [facility] Toileting- Clothing Manipulation and Hygiene: Sit to/from stand;Moderate assistance Toileting - Clothing Manipulation Details (indicate cue type and reason): pt able to pull clothing down past hips with increased time and encouragement, but assist for required to pull back up     Functional mobility during ADLs: Minimal assistance;+2 for physical assistance;+2 for safety/equipment General ADL Comments: limited by  dizziness, orthostatic hypotension    Extremity/Trunk Assessment              Vision       Perception     Praxis      Cognition Arousal: Alert Behavior During Therapy: Flat affect Overall Cognitive Status: Impaired/Different from baseline Area  of Impairment: Problem solving, Memory, Following commands, Awareness                     Memory: Decreased short-term memory Following Commands: Follows one step commands consistently, Follows one step commands with increased time, Follows multi-step commands inconsistently   Awareness: Emergent Problem Solving: Slow processing, Requires verbal cues, Difficulty sequencing General Comments: pt oriented and following commands, some slow processing and decreased recall noted; continue assessment        Exercises      Shoulder Instructions       General Comments Spo2 97% or greater on SpO2, 4 L at rest and up to 6L during activity; RR in 20s-30s.  BP montiored throughout session with BP sitting in 130s and down to 110s when standing, down to 95/81 on BSC.    Pertinent Vitals/ Pain       Pain Assessment Pain Assessment: Faces Faces Pain Scale: Hurts little more Pain Location: abdomen, L side Pain Descriptors / Indicators: Discomfort, Grimacing, Guarding Pain Intervention(s): Limited activity within patient's tolerance, Monitored during session, Repositioned  Home Living                                          Prior Functioning/Environment              Frequency  Min 1X/week        Progress Toward Goals  OT Goals(current goals can now be found in the care plan section)  Progress towards OT goals: Not progressing toward goals - comment;OT to reassess next treatment (orthostatic hypotension)  Acute Rehab OT Goals Patient Stated Goal: home OT Goal Formulation: With patient Time For Goal Achievement: 07/18/23 Potential to Achieve Goals: Good  Plan      Co-evaluation    PT/OT/SLP Co-Evaluation/Treatment: Yes Reason for Co-Treatment: To address functional/ADL transfers;For patient/therapist safety;Other (comment) (activity tolerance)   OT goals addressed during session: ADL's and self-care      AM-PAC OT "6 Clicks" Daily Activity      Outcome Measure   Help from another person eating meals?: None Help from another person taking care of personal grooming?: A Little Help from another person toileting, which includes using toliet, bedpan, or urinal?: A Lot Help from another person bathing (including washing, rinsing, drying)?: A Lot Help from another person to put on and taking off regular upper body clothing?: A Little Help from another person to put on and taking off regular lower body clothing?: A Lot 6 Click Score: 16    End of Session Equipment Utilized During Treatment: Rolling walker (2 wheels);Gait belt;Oxygen  OT Visit Diagnosis: Unsteadiness on feet (R26.81);Pain Pain - Right/Left: Left Pain - part of body:  (flank')   Activity Tolerance Patient limited by fatigue;Treatment limited secondary to medical complications (Comment) (orthostatic hypotension)   Patient Left in chair;with call bell/phone within reach;with family/visitor present   Nurse Communication Mobility status;Other (comment) (BP)        Time: 1610-9604 OT Time Calculation (min): 36 min  Charges: OT General Charges $OT Visit: 1 Visit OT  Treatments $Self Care/Home Management : 8-22 mins  Barry Brunner, OT Acute Rehabilitation Services Office [phone]   Chancy Milroy 07/12/2023, 12:03 PM

## 2023-07-12 NOTE — Progress Notes (Signed)
Trauma/Critical Care Follow Up Note  Subjective:    Overnight Issues:   Objective:  Vital signs for last 24 hours: Temp:  [97.5 F (36.4 C)-98.6 F (37 C)] 98.6 F (37 C) (09/04 0400) Pulse Rate:  [80-111] 111 (09/04 0700) Resp:  [11-31] 15 (09/04 0700) BP: (85-172)/(51-114) 137/67 (09/04 0700) SpO2:  [98 %-100 %] 98 % (09/04 0700) FiO2 (%):  [40 %] 40 % (09/04 0321) Weight:  [121.7 kg] 121.7 kg (09/04 0500)  Hemodynamic parameters for last 24 hours:    Intake/Output from previous day: 09/03 0701 - 09/04 0700 In: 770.6 [I.V.:180.1; NG/GT:364; IV Piggyback:172.7] Out: 1650 [Urine:1650]  Intake/Output this shift: No intake/output data recorded.  Vent settings for last 24 hours: Vent Mode: CPAP FiO2 (%):  [40 %] 40 % PEEP:  [8 cmH20] 8 cmH20  Physical Exam:  Gen: comfortable, no distress Neuro: follows commands, alert, communicative HEENT: PERRL Neck: supple CV: RRR Pulm: unlabored breathing on  simple mask Abd: soft, NT   , distended GU: urine clear and yellow, +spontaneous voids Extr: wwp, no edema  Results for orders placed or performed during the hospital encounter of 07/04/23 (from the past 24 hour(s))  Glucose, capillary     Status: Abnormal   Collection Time: 07/11/23 11:35 AM  Result Value Ref Range   Glucose-Capillary 131 (H) 70 - 99 mg/dL  Glucose, capillary     Status: Abnormal   Collection Time: 07/11/23  3:36 PM  Result Value Ref Range   Glucose-Capillary 150 (H) 70 - 99 mg/dL  Magnesium     Status: None   Collection Time: 07/11/23  4:50 PM  Result Value Ref Range   Magnesium 2.2 1.7 - 2.4 mg/dL  Phosphorus     Status: None   Collection Time: 07/11/23  4:50 PM  Result Value Ref Range   Phosphorus 2.6 2.5 - 4.6 mg/dL  Glucose, capillary     Status: Abnormal   Collection Time: 07/11/23  7:31 PM  Result Value Ref Range   Glucose-Capillary 153 (H) 70 - 99 mg/dL  Glucose, capillary     Status: Abnormal   Collection Time: 07/11/23 11:36 PM   Result Value Ref Range   Glucose-Capillary 157 (H) 70 - 99 mg/dL  Glucose, capillary     Status: Abnormal   Collection Time: 07/12/23  3:32 AM  Result Value Ref Range   Glucose-Capillary 159 (H) 70 - 99 mg/dL  Basic metabolic panel     Status: Abnormal   Collection Time: 07/12/23  5:56 AM  Result Value Ref Range   Sodium 131 (L) 135 - 145 mmol/L   Potassium 5.0 3.5 - 5.1 mmol/L   Chloride 99 98 - 111 mmol/L   CO2 23 22 - 32 mmol/L   Glucose, Bld 185 (H) 70 - 99 mg/dL   BUN 23 8 - 23 mg/dL   Creatinine, Ser 5.36 (L) 0.61 - 1.24 mg/dL   Calcium 7.8 (L) 8.9 - 10.3 mg/dL   GFR, Estimated >64 >40 mL/min   Anion gap 9 5 - 15  CBC     Status: Abnormal   Collection Time: 07/12/23  5:56 AM  Result Value Ref Range   WBC 17.2 (H) 4.0 - 10.5 K/uL   RBC 2.71 (L) 4.22 - 5.81 MIL/uL   Hemoglobin 7.9 (L) 13.0 - 17.0 g/dL   HCT 34.7 (L) 42.5 - 95.6 %   MCV 93.4 80.0 - 100.0 fL   MCH 29.2 26.0 - 34.0 pg   MCHC 31.2  30.0 - 36.0 g/dL   RDW 16.1 (H) 09.6 - 04.5 %   Platelets 463 (H) 150 - 400 K/uL   nRBC 0.0 0.0 - 0.2 %  Magnesium     Status: None   Collection Time: 07/12/23  5:56 AM  Result Value Ref Range   Magnesium 2.0 1.7 - 2.4 mg/dL  Phosphorus     Status: None   Collection Time: 07/12/23  5:56 AM  Result Value Ref Range   Phosphorus 2.8 2.5 - 4.6 mg/dL  I-STAT 7, (LYTES, BLD GAS, ICA, H+H)     Status: Abnormal   Collection Time: 07/12/23  8:11 AM  Result Value Ref Range   pH, Arterial 7.380 7.35 - 7.45   pCO2 arterial 41.6 32 - 48 mmHg   pO2, Arterial 79 (L) 83 - 108 mmHg   Bicarbonate 24.6 20.0 - 28.0 mmol/L   TCO2 26 22 - 32 mmol/L   O2 Saturation 95 %   Acid-base deficit 1.0 0.0 - 2.0 mmol/L   Sodium 132 (L) 135 - 145 mmol/L   Potassium 5.3 (H) 3.5 - 5.1 mmol/L   Calcium, Ion 1.21 1.15 - 1.40 mmol/L   HCT 24.0 (L) 39.0 - 52.0 %   Hemoglobin 8.2 (L) 13.0 - 17.0 g/dL   Patient temperature 40.9 F    Collection site RADIAL, ALLEN'S TEST ACCEPTABLE    Drawn by RT    Sample  type ARTERIAL     Assessment & Plan: The plan of care was discussed with the bedside nurse for the day, Rosey Bath, who is in agreement with this plan and no additional concerns were raised.   Present on Admission:  Flail chest    LOS: 8 days   Additional comments:I reviewed the patient's new clinical lab test results.   and I reviewed the patients new imaging test results.    Syncopal fall   Syncope - echo completed, carotid US negative, EKG noted L rib fx 6-11 with flail segment - aggressive pulm toilet, epidural placed by Anesthesia-now removed, noted TCTS does not recommend plating Acute hypoxic respiratory failure - BiPAP overnight, placed on facemask this AM and doing well, ABG on mask with pO2 79, Precedex prn Acute blood loss anemia -  2u pRBC 9/1, CBC P now L temporal lobe arachnoid cyst - discussed with Dr. Franky Macho - no treatment needed Insomnia/delirium - maintain sleep wake cycle during the day, sleep aid FEN - diuresed yesterday, hyperkalemia improved, will place cortrak for nutrition today DVT - SCDs, hold SQH for anticipated epidural removal Dispo - ICU, got up to the chair II spoke with his daughter at the bedside.    Diamantina Monks, MD Trauma & General Surgery Please use [email or web address] to contact on call provider  07/12/2023  *Care during the described time interval was provided by me. I have reviewed this patient's available data, including medical history, events of note, physical examination and test results as part of my evaluation.

## 2023-07-12 NOTE — Progress Notes (Signed)
ABG was ordered and obtained while patient had been off of bipap for at least 2 hours and was resting comfortably on 31% venturi mask.  Results given to MD.  Per MD to hold on going back on CPAP during the day and wear the mask tonight.  Will continue to monitor.   Latest Reference Range & Units 07/12/23 08:11  Sample type  ARTERIAL  pH, Arterial 7.35 - 7.45  7.380  pCO2 arterial 32 - 48 mmHg 41.6  pO2, Arterial 83 - 108 mmHg 79 (L)  TCO2 22 - 32 mmol/L 26  Acid-base deficit 0.0 - 2.0 mmol/L 1.0  Bicarbonate 20.0 - 28.0 mmol/L 24.6  O2 Saturation % 95  Patient temperature  98.6 F  Collection site  RADIAL, ALLEN'S TEST ACCEPTABLE

## 2023-07-13 LAB — GLUCOSE, CAPILLARY
Glucose-Capillary: 181 mg/dL — ABNORMAL HIGH (ref 70–99)
Glucose-Capillary: 216 mg/dL — ABNORMAL HIGH (ref 70–99)
Glucose-Capillary: 162 mg/dL — ABNORMAL HIGH (ref 70–99)
Glucose-Capillary: 113 mg/dL — ABNORMAL HIGH (ref 70–99)
Glucose-Capillary: 188 mg/dL — ABNORMAL HIGH (ref 70–99)
Glucose-Capillary: 172 mg/dL — ABNORMAL HIGH (ref 70–99)
Glucose-Capillary: 202 mg/dL — ABNORMAL HIGH (ref 70–99)

## 2023-07-13 LAB — PHOSPHORUS: Phosphorus: 3.3 mg/dL (ref 2.5–4.6)

## 2023-07-13 LAB — MAGNESIUM: Magnesium: 2.3 mg/dL (ref 1.7–2.4)

## 2023-07-13 LAB — CBC
HCT: 23.5 % — ABNORMAL LOW (ref 39.0–52.0)
Hemoglobin: 7.1 g/dL — ABNORMAL LOW (ref 13.0–17.0)
MCH: 28.7 pg (ref 26.0–34.0)
MCHC: 30.2 g/dL (ref 30.0–36.0)
MCV: 95.1 fL (ref 80.0–100.0)
Platelets: 438 10*3/uL — ABNORMAL HIGH (ref 150–400)
RBC: 2.47 MIL/uL — ABNORMAL LOW (ref 4.22–5.81)
RDW: 15.4 % (ref 11.5–15.5)
WBC: 17.2 10*3/uL — ABNORMAL HIGH (ref 4.0–10.5)
nRBC: 0 % (ref 0.0–0.2)

## 2023-07-13 LAB — BASIC METABOLIC PANEL
Anion gap: 7 (ref 5–15)
BUN: 35 mg/dL — ABNORMAL HIGH (ref 8–23)
CO2: 25 mmol/L (ref 22–32)
Calcium: 7.9 mg/dL — ABNORMAL LOW (ref 8.9–10.3)
Chloride: 102 mmol/L (ref 98–111)
Creatinine, Ser: 0.67 mg/dL (ref 0.61–1.24)
GFR, Estimated: >60 mL/min (ref >60)
Glucose, Bld: 210 mg/dL — ABNORMAL HIGH (ref 70–99)
Potassium: 4.8 mmol/L (ref 3.5–5.1)
Sodium: 134 mmol/L — ABNORMAL LOW (ref 135–145)

## 2023-07-13 MED ORDER — SODIUM ZIRCONIUM CYCLOSILICATE 10 G PO PACK
10.0000 g | PACK | Freq: Once | ORAL | Status: AC
  Administered 2023-07-13: 10 g via ORAL
  Filled 2023-07-13: qty 1

## 2023-07-13 MED ORDER — ENSURE ENLIVE PO LIQD
237.0000 mL | Freq: Two times a day (BID) | ORAL | Status: DC
  Administered 2023-07-13: 237 mL via ORAL

## 2023-07-13 MED ORDER — INSULIN ASPART 100 UNIT/ML IJ SOLN
3.0000 [IU] | INTRAMUSCULAR | Status: DC
  Administered 2023-07-13 – 2023-07-14 (×7): 3 [IU] via SUBCUTANEOUS

## 2023-07-13 NOTE — Progress Notes (Signed)
Patient ID: Mario Knapp, male   DOB: 27-Aug-1946, 77 y.o.   MRN: [id number] Follow up - Trauma Critical Care   Patient Details:    Mario Knapp is an 77 y.o. male.  Lines/tubes : PICC Triple Lumen 07/08/23 Right Basilic 42 cm 0 cm (Active)  Indication for Insertion or Continuance of Line Limited venous access - need for IV therapy >5 days (PICC only) 07/13/23 0800  Exposed Catheter (cm) 1 cm 07/12/23 1804  Site Assessment Clean, Dry, Intact 07/13/23 0800  Lumen #1 Status In-line blood sampling system in place 07/13/23 0800  Lumen #2 Status Infusing;Flushed;Blood return noted 07/13/23 0800  Lumen #3 Status Flushed;Blood return noted 07/13/23 0800  Dressing Type Securing device;Transparent 07/13/23 0800  Dressing Status Clean, Dry, Intact;Antimicrobial disc in place 07/13/23 0800  Line Care Connections checked and tightened 07/13/23 0800  Line Adjustment (NICU/IV Team Only) No 07/08/23 0830  Dressing Intervention New dressing 07/08/23 0830  Dressing Change Due 07/19/23 07/13/23 0800     External Urinary Catheter (Active)  Dedicated Suction Verified suction is between 40-80 mmHg 07/12/23 2000  Site Assessment Clean;Dry;Intact 07/12/23 2000  Intervention Suction Canister Replaced 07/13/23 0600  Output (mL) 200 mL 07/13/23 0600    Microbiology/Sepsis markers: Results for orders placed or performed during the hospital encounter of 07/04/23  MRSA Next Gen by PCR, Nasal     Status: None   Collection Time: 07/04/23  1:01 AM   Specimen: Nasal Mucosa; Nasal Swab  Result Value Ref Range Status   MRSA by PCR Next Gen NOT DETECTED NOT DETECTED Final    Comment: (NOTE) The GeneXpert MRSA Assay (FDA approved for NASAL specimens only), is one component of a comprehensive MRSA colonization surveillance program. It is not intended to diagnose MRSA infection nor to guide or monitor treatment for MRSA infections. Test performance is not FDA approved in patients less than 103  years old. Performed at [facility], [address], [place], [place] [address]     Anti-infectives:  Anti-infectives (From admission, onward)    None     Consults: Treatment Team:  Lovett Sox, MD    Studies:    Events:  Subjective:    Overnight Issues: on and off BiPAP overnight, off this AM, not SOB, asking to have cortrak removed  Objective:  Vital signs for last 24 hours: Temp:  [97.3 F (36.3 C)-98.6 F (37 C)] 97.3 F (36.3 C) (09/05 0400) Pulse Rate:  [80-114] 114 (09/05 0800) Resp:  [13-29] 29 (09/05 0800) BP: (89-170)/(51-111) 130/85 (09/05 0800) SpO2:  [85 %-100 %] 85 % (09/05 0800) FiO2 (%):  [40 %] 40 % (09/05 0330) Weight:  [122.7 kg] 122.7 kg (09/05 0500)  Hemodynamic parameters for last 24 hours:    Intake/Output from previous day: 09/04 0701 - 09/05 0700 In: 1163.6 [I.V.:295; NG/GT:868.7] Out: 1400 [Urine:1400]  Intake/Output this shift: Total I/O In: 52.1 [I.V.:12.1; NG/GT:40] Out: -   Vent settings for last 24 hours: Vent Mode: CPAP FiO2 (%):  [40 %] 40 % PEEP:  [8 cmH20] 8 cmH20  Physical Exam:  General: alert and no respiratory distress Neuro: alert and F/C well HEENT/Neck: cortrak, FM O2 Resp: clear to auscultation bilaterally and large L lateral chest ecchymosis CVS: RRR GI: much softer, NT Extremities: edema 1+  Results for orders placed or performed during the hospital encounter of 07/04/23 (from the past 24 hour(s))  Glucose, capillary     Status: Abnormal   Collection Time: 07/12/23 10:58 AM  Result  Value Ref Range   Glucose-Capillary 191 (H) 70 - 99 mg/dL  Glucose, capillary     Status: Abnormal   Collection Time: 07/12/23  2:57 PM  Result Value Ref Range   Glucose-Capillary 164 (H) 70 - 99 mg/dL  Magnesium     Status: None   Collection Time: 07/12/23  4:26 PM  Result Value Ref Range   Magnesium 2.0 1.7 - 2.4 mg/dL  Phosphorus     Status: None   Collection Time: 07/12/23  4:26 PM  Result Value Ref  Range   Phosphorus 3.1 2.5 - 4.6 mg/dL  Glucose, capillary     Status: Abnormal   Collection Time: 07/12/23  7:46 PM  Result Value Ref Range   Glucose-Capillary 217 (H) 70 - 99 mg/dL  Glucose, capillary     Status: Abnormal   Collection Time: 07/12/23 11:33 PM  Result Value Ref Range   Glucose-Capillary 182 (H) 70 - 99 mg/dL  Glucose, capillary     Status: Abnormal   Collection Time: 07/13/23  3:34 AM  Result Value Ref Range   Glucose-Capillary 181 (H) 70 - 99 mg/dL  Basic metabolic panel     Status: Abnormal   Collection Time: 07/13/23  5:51 AM  Result Value Ref Range   Sodium 134 (L) 135 - 145 mmol/L   Potassium 4.8 3.5 - 5.1 mmol/L   Chloride 102 98 - 111 mmol/L   CO2 25 22 - 32 mmol/L   Glucose, Bld 210 (H) 70 - 99 mg/dL   BUN 35 (H) 8 - 23 mg/dL   Creatinine, Ser 1.61 0.61 - 1.24 mg/dL   Calcium 7.9 (L) 8.9 - 10.3 mg/dL   GFR, Estimated >09 >60 mL/min   Anion gap 7 5 - 15  CBC     Status: Abnormal   Collection Time: 07/13/23  5:51 AM  Result Value Ref Range   WBC 17.2 (H) 4.0 - 10.5 K/uL   RBC 2.47 (L) 4.22 - 5.81 MIL/uL   Hemoglobin 7.1 (L) 13.0 - 17.0 g/dL   HCT 45.4 (L) 09.8 - 11.9 %   MCV 95.1 80.0 - 100.0 fL   MCH 28.7 26.0 - 34.0 pg   MCHC 30.2 30.0 - 36.0 g/dL   RDW 14.7 82.9 - 56.2 %   Platelets 438 (H) 150 - 400 K/uL   nRBC 0.0 0.0 - 0.2 %  Magnesium     Status: None   Collection Time: 07/13/23  5:51 AM  Result Value Ref Range   Magnesium 2.3 1.7 - 2.4 mg/dL  Phosphorus     Status: None   Collection Time: 07/13/23  5:51 AM  Result Value Ref Range   Phosphorus 3.3 2.5 - 4.6 mg/dL  Glucose, capillary     Status: Abnormal   Collection Time: 07/13/23  7:30 AM  Result Value Ref Range   Glucose-Capillary 216 (H) 70 - 99 mg/dL    Assessment & Plan: Present on Admission:  Flail chest    LOS: 9 days   Additional comments:I reviewed the patient's new clinical lab test results. / Syncopal fall   Syncope - echo completed, carotid US negative, EKG  noted L rib fx 6-11 with flail segment - aggressive pulm toilet, epidural placed by Anesthesia-now removed, noted TCTS does not recommend plating Acute hypoxic respiratory failure - BiPAP on and off overnight, placed on facemask this AM and doing well, try to keep off BiPAP today Acute blood loss anemia -  2u pRBC 9/1, Hb 7.1, follow  L temporal lobe arachnoid cyst - discussed with Dr. Franky Macho - no treatment needed Insomnia/delirium - maintain sleep wake cycle during the day, sleep aid FEN - large BM this AM, try fulls and encourage, D/C reglan, continue TF today, add TF coverage insulin, lokelma x 1 again for hyperkalemia DVT - SCDs, SQH Dispo - ICU, mobilize with therapies II spoke with his daughter at the bedside. Critical Care Total Time*: 34 Minutes  Violeta Gelinas, MD, MPH, FACS Trauma & General Surgery Use [email or web address] to contact on call provider  07/13/2023  *Care during the described time interval was provided by me. I have reviewed this patient's available data, including medical history, events of note, physical examination and test results as part of my evaluation.

## 2023-07-13 NOTE — Progress Notes (Signed)
Nutrition Follow-up  DOCUMENTATION CODES:   Obesity unspecified  INTERVENTION:   Vital 1.5 at 30ml/hr  If POs poor recommend advance to goal:  Goal rate of 76ml/hr ( per day) Prosource TF20 60 ml once daily  Goal tube feeding regimen provides 2060 kcal, 109 gm protein, 1008 ml free water daily  Encourage PO on Full Liquids  Pt does not like ensure.  Will try magic cup with meals.    NUTRITION DIAGNOSIS:   Inadequate oral intake related to acute illness as evidenced by NPO status.  GOAL:   Patient will meet greater than or equal to 90% of their needs  MONITOR:   Labs, Weight trends, TF tolerance, I & O's  REASON FOR ASSESSMENT:   Consult Enteral/tube feeding initiation and management  ASSESSMENT:   Pt admitted after a syncopal event leading to L rib fx 6/11 with flail segment. PMH significant  for OSA, morbid obesity, HLD, HTN, arthritis, DM2.  Spoke with RN. Pt has poor appetite.  Pt does not like ensure but willing to try magic cups.    8/30 - NPO d/t concerns for respiratory decline 8/31 - s/p abd xray, gaseous distension of large/small bowel suggestive of ileus or distal obstruction 9/3 - Cortrak placed (tip post-pyloric)   Medications: colace, SSI, 3 units novolog every 4 hours, 10 units semglee daily, miralax Precedex  Labs: Na 134 CBG's:  162-216   UOP: 1400 ml x24 hours I/O's:- 5.7L since admit   Diet Order:   Diet Order             Diet full liquid Room service appropriate? Yes; Fluid consistency: Thin  Diet effective now                   EDUCATION NEEDS:   Education needs have been addressed  Skin:  Skin Assessment: Reviewed RN Assessment  Last BM:  9/5 large; type 5  Height:   Ht Readings from Last 1 Encounters:  07/04/23 5\' 10"  (1.778 m)    Weight:   Wt Readings from Last 1 Encounters:  07/13/23 122.7 kg    Ideal Body Weight:  75.5 kg  BMI:  Body mass index is 38.81 kg/m.  Estimated Nutritional  Needs:   Kcal:  1900-2100  Protein:  100-115g  Fluid:  >/=2L  Rosaria Kubin P., RD, LDN, CNSC See AMiON for contact information

## 2023-07-13 NOTE — Progress Notes (Signed)
Physical Therapy Treatment Patient Details Name: Mario Knapp MRN: [id number] DOB: August 03, 1946 Today's Date: 07/13/2023   History of Present Illness Pt is 77 yo male who presents on 07/03/23 after a syncopal episode at home. Pt with 2 week hx of L sided chest pain, on abx from urgent care. Found with L fib fx 6-11 with flail segment. Complicated by acute hypoxic respiratory failure.  PMH includes: arthritis, depression, HTN, sleep apnea.    PT Comments  Pt reports feeling better today and was able to make good progress with functional mobility tolerance and independence. He only required CGA for safety for bedroom distance mobility using the RW today. His BP appeared to remain stable with positional changes and his SpO2 remained stable on 4L via venturi mask today, even though he did still feel a little lightheaded at times. Educated pt on activity pacing and on progressing endurance safely with frequent mobility with nursing. Will continue to follow acutely.    If plan is discharge home, recommend the following: Help with stairs or ramp for entrance;Assist for transportation;Assistance with cooking/housework;A little help with walking and/or transfers;A little help with bathing/dressing/bathroom   Can travel by private vehicle        Equipment Recommendations  Rollator (4 wheels) (bariatric)    Recommendations for Other Services       Precautions / Restrictions Precautions Precautions: Fall Precaution Comments: watch vitals; orthostatic hypotension 9/4 Restrictions Weight Bearing Restrictions: No     Mobility  Bed Mobility Overal bed mobility: Needs Assistance Bed Mobility: Rolling, Sidelying to Sit Rolling: Supervision, Used rails Sidelying to sit: Supervision, HOB elevated       General bed mobility comments: Supervision for safety    Transfers Overall transfer level: Needs assistance Equipment used: Rolling walker (2 wheels) Transfers: Sit to/from Stand Sit to  Stand: Contact guard assist           General transfer comment: Cues for hand placement, CGA for safety    Ambulation/Gait Ambulation/Gait assistance: Min assist Gait Distance (Feet): 30 Feet (x2 bouts of ~7 ft > ~30 ft) Assistive device: Rolling walker (2 wheels) Gait Pattern/deviations: Step-through pattern, Decreased stride length Gait velocity: decreased Gait velocity interpretation: <1.31 ft/sec, indicative of household ambulator   General Gait Details: Pt ambulated slowly but steadily in the room with the RW, cuing pt for activity pacing, no LOB, CGA for safety   Stairs             Wheelchair Mobility     Tilt Bed    Modified Rankin (Stroke Patients Only)       Balance Overall balance assessment: Needs assistance Sitting-balance support: No upper extremity supported, Feet supported Sitting balance-Leahy Scale: Fair Sitting balance - Comments: statically   Standing balance support: Bilateral upper extremity supported, During functional activity Standing balance-Leahy Scale: Poor Standing balance comment: relies on BUE support                            Cognition Arousal: Alert Behavior During Therapy: Flat affect Overall Cognitive Status: Impaired/Different from baseline Area of Impairment: Problem solving                             Problem Solving: Slow processing General Comments: Still mildly slow processing at times        Exercises      General Comments General comments (skin integrity, edema, etc.): BP 130s-140s/50s-70s  except x1 reading of 94/77 at end when pt had been reclined for a while, RN reported this x1 reading was not accurate; SpO2 stable on 4L throughout via venturi mask      Pertinent Vitals/Pain Pain Assessment Pain Assessment: Faces Faces Pain Scale: Hurts little more Pain Location: abdomen, L side Pain Descriptors / Indicators: Discomfort, Grimacing, Guarding Pain Intervention(s): Limited  activity within patient's tolerance, Monitored during session, Repositioned    Home Living                          Prior Function            PT Goals (current goals can now be found in the care plan section) Acute Rehab PT Goals Patient Stated Goal: return home PT Goal Formulation: With patient/family Time For Goal Achievement: 07/18/23 Potential to Achieve Goals: Good Progress towards PT goals: Progressing toward goals    Frequency    Min 1X/week      PT Plan      Co-evaluation              AM-PAC PT "6 Clicks" Mobility   Outcome Measure  Help needed turning from your back to your side while in a flat bed without using bedrails?: None Help needed moving from lying on your back to sitting on the side of a flat bed without using bedrails?: A Little Help needed moving to and from a bed to a chair (including a wheelchair)?: A Little Help needed standing up from a chair using your arms (e.g., wheelchair or bedside chair)?: A Little Help needed to walk in hospital room?: A Little Help needed climbing 3-5 steps with a railing? : A Lot 6 Click Score: 18    End of Session Equipment Utilized During Treatment: Gait belt;Oxygen Activity Tolerance: Patient tolerated treatment well Patient left: in chair;with call bell/phone within reach;with family/visitor present Nurse Communication: Mobility status;Other (comment) (BP, sats) PT Visit Diagnosis: Unsteadiness on feet (R26.81);Difficulty in walking, not elsewhere classified (R26.2);Pain;Other abnormalities of gait and mobility (R26.89);Dizziness and giddiness (R42) Pain - Right/Left: Left Pain - part of body:  (ribs)     Time: 4332-9518 PT Time Calculation (min) (ACUTE ONLY): 30 min  Charges:    $Gait Training: 8-22 mins $Therapeutic Activity: 8-22 mins PT General Charges $$ ACUTE PT VISIT: 1 Visit                     Mario Knapp, PT, DPT Acute Rehabilitation Services  Office:  [phone]    Mario Knapp 07/13/2023, 6:23 PM

## 2023-07-14 LAB — BASIC METABOLIC PANEL
Anion gap: 6 (ref 5–15)
BUN: 28 mg/dL — ABNORMAL HIGH (ref 8–23)
CO2: 28 mmol/L (ref 22–32)
Calcium: 8.2 mg/dL — ABNORMAL LOW (ref 8.9–10.3)
Chloride: 99 mmol/L (ref 98–111)
Creatinine, Ser: 0.65 mg/dL (ref 0.61–1.24)
GFR, Estimated: >60 mL/min (ref >60)
Glucose, Bld: 233 mg/dL — ABNORMAL HIGH (ref 70–99)
Potassium: 5.2 mmol/L — ABNORMAL HIGH (ref 3.5–5.1)
Sodium: 133 mmol/L — ABNORMAL LOW (ref 135–145)

## 2023-07-14 LAB — CBC
HCT: 22.6 % — ABNORMAL LOW (ref 39.0–52.0)
Hemoglobin: 7 g/dL — ABNORMAL LOW (ref 13.0–17.0)
MCH: 29.3 pg (ref 26.0–34.0)
MCHC: 31 g/dL (ref 30.0–36.0)
MCV: 94.6 fL (ref 80.0–100.0)
Platelets: 484 10*3/uL — ABNORMAL HIGH (ref 150–400)
RBC: 2.39 MIL/uL — ABNORMAL LOW (ref 4.22–5.81)
RDW: 15.2 % (ref 11.5–15.5)
WBC: 19.7 10*3/uL — ABNORMAL HIGH (ref 4.0–10.5)
nRBC: 0.2 % (ref 0.0–0.2)

## 2023-07-14 LAB — GLUCOSE, CAPILLARY
Glucose-Capillary: 221 mg/dL — ABNORMAL HIGH (ref 70–99)
Glucose-Capillary: 205 mg/dL — ABNORMAL HIGH (ref 70–99)
Glucose-Capillary: 176 mg/dL — ABNORMAL HIGH (ref 70–99)
Glucose-Capillary: 143 mg/dL — ABNORMAL HIGH (ref 70–99)
Glucose-Capillary: 154 mg/dL — ABNORMAL HIGH (ref 70–99)
Glucose-Capillary: 150 mg/dL — ABNORMAL HIGH (ref 70–99)

## 2023-07-14 LAB — HEMOGLOBIN AND HEMATOCRIT, BLOOD
HCT: 27.2 % — ABNORMAL LOW (ref 39.0–52.0)
Hemoglobin: 8.6 g/dL — ABNORMAL LOW (ref 13.0–17.0)

## 2023-07-14 LAB — PREPARE RBC (CROSSMATCH)

## 2023-07-14 MED ORDER — INSULIN GLARGINE-YFGN 100 UNIT/ML ~~LOC~~ SOLN
15.0000 [IU] | Freq: Every day | SUBCUTANEOUS | Status: DC
  Administered 2023-07-15: 15 [IU] via SUBCUTANEOUS
  Filled 2023-07-14: qty 0.15

## 2023-07-14 MED ORDER — SODIUM ZIRCONIUM CYCLOSILICATE 10 G PO PACK
10.0000 g | PACK | Freq: Once | ORAL | Status: AC
  Administered 2023-07-14: 10 g via ORAL
  Filled 2023-07-14: qty 1

## 2023-07-14 MED ORDER — OXYCODONE HCL 5 MG PO TABS: 10.0000 mg | ORAL_TABLET | ORAL | Status: DC | PRN

## 2023-07-14 MED ORDER — INSULIN ASPART 100 UNIT/ML IJ SOLN: 0.0000 [IU] | Freq: Every day | INTRAMUSCULAR | Status: DC

## 2023-07-14 MED ORDER — ALTEPLASE 2 MG IJ SOLR
2.0000 mg | Freq: Once | INTRAMUSCULAR | Status: AC
  Administered 2023-07-14: 2 mg
  Filled 2023-07-14: qty 2

## 2023-07-14 MED ORDER — INSULIN ASPART 100 UNIT/ML IJ SOLN
6.0000 [IU] | Freq: Three times a day (TID) | INTRAMUSCULAR | Status: DC
  Administered 2023-07-14 (×2): 6 [IU] via SUBCUTANEOUS

## 2023-07-14 MED ORDER — ACETAMINOPHEN 325 MG PO TABS: 650.0000 mg | ORAL_TABLET | Freq: Four times a day (QID) | ORAL | Status: DC

## 2023-07-14 MED ORDER — OXYCODONE HCL 5 MG PO TABS
10.0000 mg | ORAL_TABLET | ORAL | Status: DC | PRN
  Administered 2023-07-14 – 2023-07-15 (×3): 15 mg via ORAL
  Filled 2023-07-14 (×4): qty 3

## 2023-07-14 MED ORDER — ENOXAPARIN SODIUM 40 MG/0.4ML IJ SOSY
40.0000 mg | PREFILLED_SYRINGE | Freq: Two times a day (BID) | INTRAMUSCULAR | Status: DC
  Administered 2023-07-14 – 2023-07-15 (×3): 40 mg via SUBCUTANEOUS
  Filled 2023-07-14 (×3): qty 0.4

## 2023-07-14 MED ORDER — VITAL 1.5 CAL PO LIQD: 1000.0000 mL | ORAL | Status: DC

## 2023-07-14 MED ORDER — INSULIN ASPART 100 UNIT/ML IJ SOLN
0.0000 [IU] | Freq: Three times a day (TID) | INTRAMUSCULAR | Status: DC
  Administered 2023-07-14 (×2): 4 [IU] via SUBCUTANEOUS

## 2023-07-14 MED ORDER — ALUM & MAG HYDROXIDE-SIMETH 200-200-20 MG/5ML PO SUSP
30.0000 mL | ORAL | Status: DC | PRN
  Administered 2023-07-14 (×3): 30 mL via ORAL
  Filled 2023-07-14 (×3): qty 30

## 2023-07-14 MED ORDER — FUROSEMIDE 10 MG/ML IJ SOLN
60.0000 mg | Freq: Once | INTRAMUSCULAR | Status: AC
  Administered 2023-07-14: 60 mg via INTRAVENOUS
  Filled 2023-07-14: qty 6

## 2023-07-14 MED ORDER — VITAL 1.5 CAL PO LIQD
1000.0000 mL | ORAL | Status: AC
  Administered 2023-07-14: 1000 mL

## 2023-07-14 MED ORDER — SODIUM CHLORIDE 0.9% IV SOLUTION: Freq: Once | INTRAVENOUS | Status: AC

## 2023-07-14 MED ORDER — METHOCARBAMOL 500 MG PO TABS
1000.0000 mg | ORAL_TABLET | Freq: Three times a day (TID) | ORAL | Status: DC
  Administered 2023-07-14 (×2): 1000 mg via ORAL
  Filled 2023-07-14 (×4): qty 2

## 2023-07-14 MED ORDER — ACETAMINOPHEN 500 MG PO TABS
1000.0000 mg | ORAL_TABLET | Freq: Four times a day (QID) | ORAL | Status: DC
  Administered 2023-07-14: 1000 mg via ORAL
  Filled 2023-07-14 (×2): qty 2

## 2023-07-14 NOTE — Progress Notes (Signed)
Rounded on pt to remove tpa from picc. Able to remove 3ml of blood from white (#3) lumen. Able to flush with difficulty. Attempted to flush 2nd time and line no longer flushed. Attempted to remove tpa from gray (#2) lumen without success. Primary RN at bedside and aware. 2nd IV RN Mario Knapp also at bedside and aware. USIV 20g placed in L forearm at this time to maintain access for blood administration.

## 2023-07-14 NOTE — Progress Notes (Signed)
Trauma/Critical Care Follow Up Note  Subjective:    Overnight Issues:   Objective:  Vital signs for last 24 hours: Temp:  [97 F (36.1 C)-98.1 F (36.7 C)] 97 F (36.1 C) (09/06 0800) Pulse Rate:  [89-114] 107 (09/06 0900) Resp:  [13-29] 20 (09/06 0900) BP: (92-175)/(50-134) 119/59 (09/06 0900) SpO2:  [87 %-100 %] 95 % (09/06 0900) FiO2 (%):  [40 %] 40 % (09/06 0235) Weight:  [123.5 kg] 123.5 kg (09/06 0500)  Hemodynamic parameters for last 24 hours:    Intake/Output from previous day: 09/05 0701 - 09/06 0700 In: 1051.3 [I.V.:131.3; NG/GT:920] Out: 600 [Urine:600]  Intake/Output this shift: No intake/output data recorded.  Vent settings for last 24 hours: Vent Mode: CPAP FiO2 (%):  [40 %] 40 % PEEP:  [5 cmH20] 5 cmH20  Physical Exam:  Gen: comfortable, no distress Neuro: follows commands, alert, communicative HEENT: PERRL Neck: supple CV: RRR Pulm: unlabored breathing on [place] Abd: soft, NT    GU: urine clear and yellow, +spontaneous voids Extr: wwp, no edema  Results for orders placed or performed during the hospital encounter of 06/15/2023 (from the past 24 hour(s))  Glucose, capillary     Status: Abnormal   Collection Time: 07/13/23 11:22 AM  Result Value Ref Range   Glucose-Capillary 162 (H) 70 - 99 mg/dL  Glucose, capillary     Status: Abnormal   Collection Time: 07/13/23  3:42 PM  Result Value Ref Range   Glucose-Capillary 113 (H) 70 - 99 mg/dL  Glucose, capillary     Status: Abnormal   Collection Time: 07/13/23  6:25 PM  Result Value Ref Range   Glucose-Capillary 188 (H) 70 - 99 mg/dL  Glucose, capillary     Status: Abnormal   Collection Time: 07/13/23  7:42 PM  Result Value Ref Range   Glucose-Capillary 172 (H) 70 - 99 mg/dL  Glucose, capillary     Status: Abnormal   Collection Time: 07/13/23 11:22 PM  Result Value Ref Range   Glucose-Capillary 202 (H) 70 - 99 mg/dL  Glucose, capillary     Status: Abnormal   Collection Time: 07/14/23  3:59 AM   Result Value Ref Range   Glucose-Capillary 221 (H) 70 - 99 mg/dL  CBC     Status: Abnormal   Collection Time: 07/14/23  5:38 AM  Result Value Ref Range   WBC 19.7 (H) 4.0 - 10.5 K/uL   RBC 2.39 (L) 4.22 - 5.81 MIL/uL   Hemoglobin 7.0 (L) 13.0 - 17.0 g/dL   HCT 13.0 (L) 86.5 - 78.4 %   MCV 94.6 80.0 - 100.0 fL   MCH 29.3 26.0 - 34.0 pg   MCHC 31.0 30.0 - 36.0 g/dL   RDW 69.6 29.5 - 28.4 %   Platelets 484 (H) 150 - 400 K/uL   nRBC 0.2 0.0 - 0.2 %  Basic metabolic panel     Status: Abnormal   Collection Time: 07/14/23  5:38 AM  Result Value Ref Range   Sodium 133 (L) 135 - 145 mmol/L   Potassium 5.2 (H) 3.5 - 5.1 mmol/L   Chloride 99 98 - 111 mmol/L   CO2 28 22 - 32 mmol/L   Glucose, Bld 233 (H) 70 - 99 mg/dL   BUN 28 (H) 8 - 23 mg/dL   Creatinine, Ser 1.32 0.61 - 1.24 mg/dL   Calcium 8.2 (L) 8.9 - 10.3 mg/dL   GFR, Estimated >44 >01 mL/min   Anion gap 6 5 - 15  Glucose,  capillary     Status: Abnormal   Collection Time: 07/14/23  7:34 AM  Result Value Ref Range   Glucose-Capillary 205 (H) 70 - 99 mg/dL    Assessment & Plan: The plan of care was discussed with the bedside nurse for the day, Cyndia Skeeters., who is in agreement with this plan and no additional concerns were raised.   Present on Admission:  Flail chest    LOS: 10 days   Additional comments:I reviewed the patient's new clinical lab test results.   and I reviewed the patients new imaging test results.    Syncopal fall   Syncope - echo completed, carotid US negative, EKG noted L rib fx 6-11 with flail segment - aggressive pulm toilet, epidural placed by Anesthesia-now removed, noted TCTS does not recommend plating Acute hypoxic respiratory failure - BiPAP HS, did not require during the day yest, pulling 750 on IS this AM Acute blood loss anemia -  2u pRBC 9/1, Hb 7, txf 1u prbc today L temporal lobe arachnoid cyst - discussed with Dr. Franky Macho - no treatment needed Insomnia/delirium - maintain sleep wake cycle  during the day, sleep aid FEN - large BM yest, adv diet, continue TF today and tonight, adv to reg diet, change to nocturnal TF 9/7 PM, d/c if meeting 75% of goal nutrition to reduce aspiration resk due to bipap HS, add TF coverage insulin, lokelma and lasix x 1 again for hyperkalemia DVT - SCDs, SQH Dispo - 4NP, mobilize with therapies  I spoke with his daughter at the bedside.  Diamantina Monks, MD Trauma & General Surgery Please use [email or web address] to contact on call provider  07/14/2023  *Care during the described time interval was provided by me. I have reviewed this patient's available data, including medical history, events of note, physical examination and test results as part of my evaluation.

## 2023-07-14 NOTE — Progress Notes (Signed)
   07/14/23 2121  BiPAP/CPAP/SIPAP  $ Non-Invasive Home Ventilator  Subsequent  BiPAP/CPAP/SIPAP Pt Type Adult  BiPAP/CPAP/SIPAP Resmed  Mask Type Full face mask  Mask Size Medium  Respiratory Rate 26 breaths/min  IPAP 17 cmH20  EPAP 13 cmH2O  FiO2 (%) 21 %  Minute Ventilation 15.6  Leak 16  Tidal Volume (Vt) 327  Patient Home Equipment Yes  Auto Titrate No  Nasal massage performed Yes  CPAP/SIPAP surface wiped down Yes  Safety Check Completed by RT for Home Unit Yes, no issues noted  BiPAP/CPAP /SiPAP Vitals  SpO2 93 %  Bilateral Breath Sounds Clear;Diminished

## 2023-07-14 NOTE — Progress Notes (Addendum)
Patient transferred to 4NP06, walking with FWW, standby assist. Report given to [facility]. All belongings taken with patient. Message left with daughter of transfer.

## 2023-07-14 NOTE — Progress Notes (Signed)
 PICC line removed per order and with no complications.  Pressure held to achieve hemostasis.  Vaseline/gauze/tegaderm applied.  Aftercare instructions reviewed with pt who verbalized understanding.

## 2023-07-14 NOTE — Progress Notes (Signed)
Physical Therapy Treatment Patient Details Name: Mario Knapp MRN: [id number] DOB: 08-21-1946 Today's Date: 07/14/2023   History of Present Illness Pt is 77 yo male who presents on 07/03/23 after a syncopal episode at home. Pt with 2 week hx of L sided chest pain, on abx from urgent care. Found with L fib fx 6-11 with flail segment. Complicated by acute hypoxic respiratory failure.  PMH includes: arthritis, depression, HTN, sleep apnea.    PT Comments  Pt was able to progress his gait distance today, ambulating up to ~100 ft with a RW at a CGA level. He initially did not believe he could ambulate away from his recliner due to symptoms of lightheadedness. His BP did appear to drop initially upon standing, see General Comments below, but with lateral weight shifting and leg movement standing in place anterior to chair his symptoms and BP improves. Educated pt on performing exercises to improve his BP and symptoms before starting to ambulate. Increased time spent transferring pt to stand and pivoting to/from [facility] and performing clean up after BM today. Pt encouraged to clean himself in standing (with x1 UE support on RW) and donn his pants in sitting to increase his independence and balance as pt was initially asking therapist to do it for him. Will continue to follow acutely.    If plan is discharge home, recommend the following: Help with stairs or ramp for entrance;Assist for transportation;Assistance with cooking/housework;A little help with walking and/or transfers;A little help with bathing/dressing/bathroom   Can travel by private vehicle        Equipment Recommendations  Rollator (4 wheels) (bariatric)    Recommendations for Other Services       Precautions / Restrictions Precautions Precautions: Fall Precaution Comments: watch vitals; orthostatic hypotension 9/4 Restrictions Weight Bearing Restrictions: No     Mobility  Bed Mobility               General bed mobility  comments: Pt OOB in chair upon arrival    Transfers Overall transfer level: Needs assistance Equipment used: Rolling walker (2 wheels) Transfers: Sit to/from Stand, Bed to chair/wheelchair/BSC Sit to Stand: Contact guard assist   Step pivot transfers: Contact guard assist       General transfer comment: Cues for hand placement, CGA for safety    Ambulation/Gait Ambulation/Gait assistance: Contact guard assist, +2 safety/equipment Gait Distance (Feet): 100 Feet (x2 bouts of ~100 ft > ~3 ft) Assistive device: Rolling walker (2 wheels) Gait Pattern/deviations: Step-through pattern, Decreased stride length Gait velocity: decreased Gait velocity interpretation: <1.31 ft/sec, indicative of household ambulator   General Gait Details: Pt ambulated slowly but steadily with the RW, cuing pt for activity pacing, no LOB, CGA for safety. Chair follow for safety   Stairs             Wheelchair Mobility     Tilt Bed    Modified Rankin (Stroke Patients Only)       Balance Overall balance assessment: Needs assistance Sitting-balance support: No upper extremity supported, Feet supported Sitting balance-Leahy Scale: Good Sitting balance - Comments: Able to reach off BOS to laso pants around feet in sitting   Standing balance support: Bilateral upper extremity supported, During functional activity, Single extremity supported Standing balance-Leahy Scale: Poor Standing balance comment: Reliant on 1 UE support to reach off COG to clean self after BM, CGA for safety  Cognition Arousal: Alert Behavior During Therapy: WFL for tasks assessed/performed Overall Cognitive Status: Impaired/Different from baseline Area of Impairment: Problem solving                             Problem Solving: Slow processing General Comments: Still mildly slow processing at times        Exercises      General Comments General comments (skin  integrity, edema, etc.): SpO2 stable on 3L va [place] throughout, BP 119/59 standing the first rep, 148/67 sitting after standing the first rep, 150/124 standing the second rep with pt shifting weight laterally in place, 149/107 sitting after ambulating; educated pt on importance on "pushing through" the initial uncomfortable feeling when he first stands up (within reason though) and performing lateral weight shifts and ankle pumps to try to improve BP and symptoms before beginning to ambulate and to sit if symptoms worsen for his safety      Pertinent Vitals/Pain Pain Assessment Pain Assessment: Faces Faces Pain Scale: Hurts little more Pain Location: abdomen, L side Pain Descriptors / Indicators: Discomfort, Grimacing, Guarding Pain Intervention(s): Limited activity within patient's tolerance, Monitored during session, Repositioned    Home Living                          Prior Function            PT Goals (current goals can now be found in the care plan section) Acute Rehab PT Goals Patient Stated Goal: return home PT Goal Formulation: With patient/family Time For Goal Achievement: 07/18/23 Potential to Achieve Goals: Good Progress towards PT goals: Progressing toward goals    Frequency    Min 1X/week      PT Plan      Co-evaluation              AM-PAC PT "6 Clicks" Mobility   Outcome Measure  Help needed turning from your back to your side while in a flat bed without using bedrails?: None Help needed moving from lying on your back to sitting on the side of a flat bed without using bedrails?: A Little Help needed moving to and from a bed to a chair (including a wheelchair)?: A Little Help needed standing up from a chair using your arms (e.g., wheelchair or bedside chair)?: A Little Help needed to walk in hospital room?: A Little Help needed climbing 3-5 steps with a railing? : A Lot 6 Click Score: 18    End of Session Equipment Utilized During Treatment:  Gait belt;Oxygen Activity Tolerance: Patient tolerated treatment well Patient left: in chair;with call bell/phone within reach;with family/visitor present Nurse Communication: Mobility status PT Visit Diagnosis: Unsteadiness on feet (R26.81);Difficulty in walking, not elsewhere classified (R26.2);Pain;Other abnormalities of gait and mobility (R26.89);Dizziness and giddiness (R42) Pain - Right/Left: Left Pain - part of body:  (ribs)     Time: 1610-9604 PT Time Calculation (min) (ACUTE ONLY): 55 min  Charges:    $Gait Training: 8-22 mins $Therapeutic Activity: 38-52 mins PT General Charges $$ ACUTE PT VISIT: 1 Visit                     [name], PT, DPT Acute Rehabilitation Services  Office: [phone]    [name] 07/14/2023, 12:15 PM

## 2023-07-14 NOTE — Progress Notes (Addendum)
Nutrition Follow-up  DOCUMENTATION CODES:   Obesity unspecified  INTERVENTION:   -Continue Magic cup TID with meals, each supplement provides 290 kcal and 9 grams of protein  -Continue TF via cortrak tube (until 07/12/2023 0800):   Vital 1.5 @ 55 ml/hr (132 ml daily)  60 ml Prosource TF daily  Tube feeding regimen provides 2060 kcal 109 grams of protein, and 1008 ml of H2O    -Transition to nocturnal feeds on 07/09/2023 1800):  Vital 1.5 @ 70 ml/hr over 14 hours (1800-0800) (980 ml daily)  60 ml Prosource TF daily  Tube feeding regimen provides 1550 kcal, 86 grams of protein, and 749 ml of H2O    -Initiate 48 hour calorie count on 07/14/2023  NUTRITION DIAGNOSIS:   Inadequate oral intake related to acute illness as evidenced by NPO status.  Progressing; advanced to regular diet on 07/13/23; receiving TF at goal rate  GOAL:   Patient will meet greater than or equal to 90% of their needs  Met with TF  MONITOR:   Labs, Weight trends, TF tolerance, I & O's  REASON FOR ASSESSMENT:   Consult Enteral/tube feeding initiation and management  ASSESSMENT:   Pt admitted after a syncopal event leading to L rib fx 6/11 with flail segment. PMH significant  for OSA, morbid obesity, HLD, HTN, arthritis, DM2.  8/30 - NPO d/t concerns for respiratory decline 8/31 - s/p abd xray, gaseous distension of large/small bowel suggestive of ileus or distal obstruction 9/3 - Cortrak placed (tip post-pyloric) 9/5- advanced to full liquid diet 9/6- advanced to a regular diet  Reviewed I/O's: +451 ml x 24 hours and -5.5 L since admission  UOP: 600 ml x 24 hours   Pt requiring Bipap at night- did not require Bi-pap during the day yesterday.   Pt has not yet received a meal tray today.   Per trauma, plan to continue TF today and tonight (pt now at goal rate of 55 ml/hr). Plan to transition to nocturnal feedings on 07/18/2023 PM; will continue to monitor oral intake. Can consider d/c cortrak tube if pt  an consistently meet >75% of estimated nutritional needs.   Plan communicated with RN.  Medications reviewed and include colace, melatonin, and miralax.   Labs reviewed: CBGS: 113-221 (inpatient orders for glycemic control are 0-20 units insulin aspart TID with meals, 0-5 units insulin aspart daily at bedtime,. 6 units insulin aspart TID with meals,and 15 units insulin glargine-yfgn daily).    Diet Order:   Diet Order             Diet regular Room service appropriate? Yes; Fluid consistency: Thin  Diet effective now                   EDUCATION NEEDS:   Education needs have been addressed  Skin:  Skin Assessment: Reviewed RN Assessment  Last BM:  9/5 large; type 5  Height:   Ht Readings from Last 1 Encounters:  06/26/2023 5\' 10"  (1.778 m)    Weight:   Wt Readings from Last 1 Encounters:  07/14/23 123.5 kg    Ideal Body Weight:  75.5 kg  BMI:  Body mass index is 39.07 kg/m.  Estimated Nutritional Needs:   Kcal:  1900-2100  Protein:  100-115g  Fluid:  >/=2L    Levada Schilling, RD, LDN, CDCES Registered Dietitian II Certified Diabetes Care and Education Specialist Please refer to AMION for RD and/or RD on-call/weekend/after hours pager

## 2023-07-15 ENCOUNTER — Inpatient Hospital Stay (HOSPITAL_COMMUNITY): Payer: PPO | Admitting: Certified Registered Nurse Anesthetist

## 2023-07-15 ENCOUNTER — Inpatient Hospital Stay (HOSPITAL_COMMUNITY): Payer: PPO

## 2023-07-15 ENCOUNTER — Encounter (HOSPITAL_COMMUNITY): Payer: Self-pay | Admitting: Certified Registered Nurse Anesthetist

## 2023-07-15 DIAGNOSIS — I469 Cardiac arrest, cause unspecified: Secondary | ICD-10-CM | POA: Diagnosis not present

## 2023-07-15 DIAGNOSIS — R55 Syncope and collapse: Secondary | ICD-10-CM | POA: Diagnosis present

## 2023-07-15 DIAGNOSIS — S2249XA Multiple fractures of ribs, unspecified side, initial encounter for closed fracture: Secondary | ICD-10-CM | POA: Diagnosis present

## 2023-07-15 LAB — TYPE AND SCREEN
ABO/RH(D): O NEG
Antibody Screen: NEGATIVE
Unit division: 0

## 2023-07-15 LAB — BPAM RBC
Blood Product Expiration Date: 202409242359
ISSUE DATE / TIME: 202409061603
Unit Type and Rh: 9500

## 2023-07-15 LAB — GLUCOSE, CAPILLARY
Glucose-Capillary: 278 mg/dL — ABNORMAL HIGH (ref 70–99)
Glucose-Capillary: 243 mg/dL — ABNORMAL HIGH (ref 70–99)

## 2023-07-25 ENCOUNTER — Ambulatory Visit: Payer: PPO | Admitting: Psychiatry

## 2023-08-08 NOTE — Death Summary Note (Signed)
Pt attempted to get out of bed again after the initial episode this morning and he became unresponsive.  Code blue was started and ACLS was started @ 0844.  Pt was pulseless and asytole.  CRNA intubated pt.  Multiple rounds of ACLS were performed with no ROSC.  After 30 min of CPR, pt continued to remain asystole, pupils were fixed and dilated.  TOD called at 0914.  Pt's daughter at bedside and pt's wife and other family were notified and are on their way.   Medical Examiner, Sabas Sous was called @ [phone].  This will be an ME case.  Leave everything in and transport body to morgue once body prep complete.    Chaplain at bedside comforting family at this time.

## 2023-08-08 NOTE — Progress Notes (Signed)
   07/17/2023 1046  Spiritual Encounters  Care provided to: Family;Pt not available  Conversation partners present during encounter Nurse;Physician;Other (comment)  Referral source Code page  Reason for visit Code  OnCall Visit Yes   Responded to code blue page. Arrived and met with daughter Kriste Basque and proved comfort. Patient 2 code, unable to resus. Remained with daughter in the hallway until she was able to view patient.  Stayed with daughter in room with patient until other family members arrived. Patient's spouse, Gigi Gin, daughter April, Son-in-law and grandson arrived. Stayed and prayed with family until they were ready. Family is contacting funeral home in [place]. Provided family with patient card to call in the information for the funeral home.

## 2023-08-08 NOTE — Progress Notes (Signed)
Trauma Event Note    Reason for Call : Pt had syncopal episode while trying to transfer from bed to chair.   Initial Focused Assessment: Pt tachypneac in 20's-30's Pallor and diaphoretic  Pt responsive to voice and oriented x4 Lung sounds diminished on L side SBP 92 after getting pt back to bed.  Now in low 100's.   Interventions: Notified Dr Freida Busman who is at bedside EKG ordered and done. CXR ordered  CBC, CMP, mag and Troponin ordered    Plan of Care: See what troponin shows - poss cards consult  Possible tx back to ICU.   Event Summary: Pt transferred from ICU yesterday and was on bipap until about 3 days ago.  According to family member, pt had a good day yesterday but was slightly restless last night.  Pt had blood transfusion yesterday for hgb of 7 --> now 8.6. Pt back in bed currently attempting to have a BM on the bedpan.   Last imported Vital Signs BP 138/60 (BP Location: Left Arm)   Pulse (!) 114   Temp 97.9 F (36.6 C) (Oral)   Resp (!) 21   Ht 5\' 10"  (1.778 m)   Wt 272 lb 4.3 oz (123.5 kg)   SpO2 93%   BMI 39.07 kg/m   Trending CBC Recent Labs    07/13/23 0551 07/14/23 0538 07/14/23 2100  WBC 17.2* 19.7*  --   HGB 7.1* 7.0* 8.6*  HCT 23.5* 22.6* 27.2*  PLT 438* 484*  --     Trending Coag's No results for input(s): "APTT", "INR" in the last 72 hours.  Trending BMET Recent Labs    07/13/23 0551 07/14/23 0538  NA 134* 133*  K 4.8 5.2*  CL 102 99  CO2 25 28  BUN 35* 28*  CREATININE 0.67 0.65  GLUCOSE 210* 233*    Jaelle Campanile W  Trauma Response RN  Please call TRN at [phone] for further assistance.

## 2023-08-08 NOTE — Death Summary Note (Signed)
DEATH SUMMARY   Patient Details  Name: Mario Knapp MRN: [id number] DOB: Apr 08, 1946  Admission/Discharge Information   Admit Date:  08-03-2023  Date of Death: Date of Death: 08-14-2023  Time of Death: Time of Death: 0915  Length of Stay: 18-Oct-2023  Referring Physician: Gracelyn Nurse, MD   Reason(s) for Hospitalization  Fall  Diagnoses  Preliminary cause of death: Critical polytrauma Secondary Diagnoses (including complications and co-morbidities):  Principal Problem:   Flail chest Active Problems:   Multiple rib fractures   Syncope and collapse   Cardiac arrest [facility])   Brief Hospital Course (including significant findings, care, treatment, and services provided and events leading to death)  Mario Knapp is a 77 y.o. year old male who is s/p fall. Patient had a syncopal even this morning when getting out of bed. I examined patient at bedside around 7:30am. At that time he was somnolent but roused easily, and was oriented and answering questions appropriately. Increased work of breathing noted. Patient had drop in SBP to 90s when getting up, but BP had recovered with him supine in bed. He reported he still felt like he was going to pass out. Stat labs were ordered. CXR unremarkable, EKG showed RBBB, which was present on prior EKG. Prior echo reviewed and showed normal function.   While awaiting lab workup, patient per report tried to get out of bed again and became unresponsive, and a code blue was called. ACLS was started at 0844, and patient was pulseless and in asystole at that time. Multiple rounds of ACLS were done, with no ROSC, and patient remained in asystole. Intubation performed by anesthesia. After 30 minutes of ACLS, patient remained in asystole, and pupils were fixed and dilated. Time of death was called at 0914. Patient's wife was notified immediately.    Pertinent Labs and Studies  Significant Diagnostic Studies DG CHEST PORT 1 VIEW  Result Date:  August 14, 2023 CLINICAL DATA:  Dyspnea. EXAM: PORTABLE CHEST 1 VIEW COMPARISON:  July 09, 2023. FINDINGS: Stable cardiomediastinal silhouette. Feeding tube is seen entering stomach. Left basilar atelectasis or infiltrate is noted with associated effusion. Right lung is clear. Bony thorax is unremarkable IMPRESSION: Stable left basilar opacity as noted on prior exam. Electronically Signed   By: Lupita Raider M.D.   On: 08/14/23 08:50   DG Abd Portable 1V  Result Date: 07/11/2023 CLINICAL DATA:  Feeding tube placement. EXAM: PORTABLE ABDOMEN - 1 VIEW COMPARISON:  July 08, 2023. FINDINGS: Distal tip of feeding tube is seen in expected position of ligament of Treitz or proximal jejunum. IMPRESSION: See above. Electronically Signed   By: Lupita Raider M.D.   On: 07/11/2023 12:04   DG Chest Port 1 View  Result Date: 07/09/2023 CLINICAL DATA:  Respiratory failure EXAM: PORTABLE CHEST 1 VIEW COMPARISON:  07/08/2023 FINDINGS: Left base consolidation or volume loss. Layering pleural effusion on the left. Pulmonary vascular congestion. Calcified aorta. No pneumothorax. IMPRESSION: Vascular congestion. Left base consolidation or volume loss and left-sided pleural effusion. Electronically Signed   By: Layla Maw M.D.   On: 07/09/2023 09:25   DG Abd 1 View  Result Date: 07/08/2023 CLINICAL DATA:  Ileus EXAM: ABDOMEN - 1 VIEW COMPARISON:  07/07/2023, 07/03/2023 FINDINGS: Three supine frontal views of the abdomen and pelvis are obtained. Evaluation is limited by patient body habitus and portable technique. Progressive gaseous distention of the large and small bowel compatible with given history of ileus. No masses or abnormal calcifications. Displaced left rib fractures  again noted. IMPRESSION: 1. Progressive gaseous distension of large and small bowel, compatible with ileus or distal obstruction. Electronically Signed   By: Sharlet Salina M.D.   On: 07/08/2023 15:32   DG Chest Port 1 View  Result Date:  07/08/2023 CLINICAL DATA:  Respiratory failure. EXAM: PORTABLE CHEST 1 VIEW COMPARISON:  07/07/2023 FINDINGS: Low volume film. The cardio pericardial silhouette is enlarged. Vascular congestion again noted. Left base collapse/consolidation with small to moderate pleural effusion is similar to prior. Right PICC line tip is new in the interval with tip overlying the right atrium. Telemetry leads overlie the chest. IMPRESSION: 1. Interval placement of right PICC line with tip overlying the right atrium. 2. Otherwise no substantial interval change. Electronically Signed   By: Kennith Center M.D.   On: 07/08/2023 13:21   [place] EKG SITE RITE  Result Date: 07/07/2023 If Site Rite image not attached, placement could not be confirmed due to current cardiac rhythm.  VAS US CAROTID  Result Date: 07/07/2023 Carotid Arterial Duplex Study Patient Name:  Mario Knapp  Date of Exam:   07/06/2023 Medical Rec #: [id number]           Accession #:    [id number] Date of Birth: 01-Sep-1946            Patient Gender: M Patient Age:   60 years Exam Location:  [facility] Procedure:      VAS US CAROTID Referring Phys: Kris Mouton --------------------------------------------------------------------------------  Indications:       Syncope. Risk Factors:      Hypertension, hyperlipidemia. Other Factors:     Obstructive sleep apnea (non compliant on CPap). Limitations        Today's exam was limited due to the patient's respiratory                    variation, the body habitus of the patient and pate int in                    recliner. Comparison Study:  No prior study on file Performing Technologist: Sherren Kerns RVS  Examination Guidelines: A complete evaluation includes B-mode imaging, spectral Doppler, color Doppler, and power Doppler as needed of all accessible portions of each vessel. Bilateral testing is considered an integral part of a complete examination. Limited examinations for reoccurring indications may be  performed as noted.  Right Carotid Findings: +----------+--------+--------+--------+------------------+--------+           PSV cm/sEDV cm/sStenosisPlaque DescriptionComments +----------+--------+--------+--------+------------------+--------+ CCA Prox  61      12                                         +----------+--------+--------+--------+------------------+--------+ CCA Distal57      9                                          +----------+--------+--------+--------+------------------+--------+ ICA Prox  76      17              focal                      +----------+--------+--------+--------+------------------+--------+ ICA Mid   115     22                                         +----------+--------+--------+--------+------------------+--------+  ICA Distal106     29                                         +----------+--------+--------+--------+------------------+--------+ ECA       70      9                                          +----------+--------+--------+--------+------------------+--------+ +----------+--------+-------+--------+-------------------+           PSV cm/sEDV cmsDescribeArm Pressure (mmHG) +----------+--------+-------+--------+-------------------+ [id number]                                         +----------+--------+-------+--------+-------------------+ +---------+--------+--+--------+--+ VertebralPSV cm/s47EDV cm/s14 +---------+--------+--+--------+--+  Left Carotid Findings: +----------+--------+--------+--------+------------------+--------+           PSV cm/sEDV cm/sStenosisPlaque DescriptionComments +----------+--------+--------+--------+------------------+--------+ CCA Prox  130     22                                         +----------+--------+--------+--------+------------------+--------+ CCA Distal58      10                                          +----------+--------+--------+--------+------------------+--------+ ICA Prox  70      17                                         +----------+--------+--------+--------+------------------+--------+ ICA Mid   90      25                                         +----------+--------+--------+--------+------------------+--------+ ICA Distal142     26                                         +----------+--------+--------+--------+------------------+--------+ ECA       98      14                                         +----------+--------+--------+--------+------------------+--------+ +----------+--------+--------+--------+-------------------+           PSV cm/sEDV cm/sDescribeArm Pressure (mmHG) +----------+--------+--------+--------+-------------------+ [id number]                                         +----------+--------+--------+--------+-------------------+ +---------+--------+--+--------+--+ VertebralPSV cm/s52EDV cm/s10 +---------+--------+--+--------+--+   Summary: Right Carotid: The extracranial vessels were near-normal with only minimal wall                thickening or plaque. Left Carotid: The extracranial vessels were near-normal with only minimal  wall               thickening or plaque. Vertebrals:  Bilateral vertebral arteries demonstrate antegrade flow. Subclavians: Normal flow hemodynamics were seen in bilateral subclavian              arteries. *See table(s) above for measurements and observations.  Electronically signed by Gerarda Fraction on 07/07/2023 at 9:20:38 AM.    Final    DG Chest Port 1 View  Result Date: 07/07/2023 CLINICAL DATA:  Shortness of breath.  NG tube placement. EXAM: PORTABLE CHEST 1 VIEW COMPARISON:  07/06/2023 FINDINGS: The enteric tube tip and side port are in satisfactory position well below the GE junction. Increased gas scratch stable cardiomediastinal contours. Decreased aeration to the left lung base corresponding to previous  noted left pleural effusion. Interval increase in interstitial markings concerning for pulmonary vascular congestion. Again seen are acute left rib fractures which appear displaced. IMPRESSION: 1. Satisfactory position of enteric tube. 2. Increased interstitial markings concerning for pulmonary vascular congestion. 3. Decreased aeration to the left lung base corresponding to previous noted left pleural effusion. 4. Displaced acute rib fractures on the left as noted previously. Electronically Signed   By: Signa Kell M.D.   On: 07/07/2023 06:59   DG Abd Portable 1V  Result Date: 07/07/2023 CLINICAL DATA:  Status post NG tube placement. EXAM: PORTABLE ABDOMEN - 1 VIEW COMPARISON:  07/03/2023 FINDINGS: Enteric tube is identified with tip in the projection of the expected location of the body of stomach. Side port is well below the GE junction. Increase gaseous distension of the transverse colon is noted. Decreased aeration to the left base compatible with previously noted pleural effusion. IMPRESSION: 1. Enteric tube tip is in the projection of the body of stomach. 2. Increased gaseous distension of the transverse colon. Electronically Signed   By: Signa Kell M.D.   On: 07/07/2023 06:54   DG Chest Port 1 View  Result Date: 07/06/2023 CLINICAL DATA:  Shortness of breath. EXAM: PORTABLE CHEST 1 VIEW COMPARISON:  14-Jul-2023.  July 03, 2023. FINDINGS: Stable cardiomegaly. Elevated right hemidiaphragm. Mild left basilar atelectasis is noted. Bony thorax is unremarkable. IMPRESSION: Mild left basilar atelectasis. Electronically Signed   By: Lupita Raider M.D.   On: 07/06/2023 10:00   DG CHEST PORT 1 VIEW  Result Date: 2023-07-14 CLINICAL DATA:  Shortness of breath EXAM: PORTABLE CHEST 1 VIEW COMPARISON:  07/03/2023 FINDINGS: Shallow inspiration. Cardiac enlargement. Suggestion of small pleural effusions with basilar atelectasis. No pneumothorax. Calcification of the aorta. IMPRESSION: Cardiac  enlargement. Shallow inspiration with possible effusions and atelectasis in the lung bases. Electronically Signed   By: Burman Nieves M.D.   On: 07/14/23 18:27   ECHOCARDIOGRAM COMPLETE BUBBLE STUDY  Result Date: 07/14/2023    ECHOCARDIOGRAM REPORT   Patient Name:   EVALD VALASEK Date of Exam: 07/14/23 Medical Rec #:  [id number]          Height:       70.0 in Accession #:    [id number]         Weight:       268.7 lb Date of Birth:  01/29/1946           BSA:          2.367 m Patient Age:    77 years           BP:           173/78 mmHg Patient Gender: M  HR:           96 bpm. Exam Location:  Inpatient Procedure: 2D Echo, Color Doppler, Cardiac Doppler, Intracardiac Opacification            Agent and Saline Contrast Bubble Study Indications:    Syncope  History:        Patient has no prior history of Echocardiogram examinations.                 Signs/Symptoms:Syncope; Risk Factors:Dyslipidemia, Hypertension,                 Diabetes and Sleep Apnea.  Sonographer:    Milbert Coulter Referring Phys: [id number] Diamantina Monks  Sonographer Comments: Patient is obese and no subcostal window. Image acquisition challenging due to patient body habitus. IMPRESSIONS  1. Left ventricular ejection fraction, by estimation, is 65 to 70%. The left ventricle has normal function. The left ventricle has no regional wall motion abnormalities. There is moderate left ventricular hypertrophy. Left ventricular diastolic parameters are consistent with Grade I diastolic dysfunction (impaired relaxation).  2. Right ventricular systolic function is hyperdynamic. The right ventricular size is normal. Tricuspid regurgitation signal is inadequate for assessing PA pressure.  3. Left atrial size was mildly dilated.  4. The mitral valve was not well visualized. No evidence of mitral valve regurgitation.  5. The aortic valve was not well visualized. Aortic valve regurgitation is mild.  6. Technically difficult study.  Comparison(s): No prior Echocardiogram. FINDINGS  Left Ventricle: Intracavitary gradient related to dynamica function; maximal resting gradient not well characterized. Left ventricular ejection fraction, by estimation, is 65 to 70%. The left ventricle has normal function. The left ventricle has no regional wall motion abnormalities. Definity contrast agent was given IV to delineate the left ventricular endocardial borders. The left ventricular internal cavity size was normal in size. There is moderate left ventricular hypertrophy. Left ventricular  diastolic parameters are consistent with Grade I diastolic dysfunction (impaired relaxation). Right Ventricle: The right ventricular size is normal. No increase in right ventricular wall thickness. Right ventricular systolic function is hyperdynamic. Tricuspid regurgitation signal is inadequate for assessing PA pressure. Left Atrium: Left atrial size was mildly dilated. Right Atrium: Right atrial size was normal in size. Pericardium: There is no evidence of pericardial effusion. Mitral Valve: The mitral valve was not well visualized. No evidence of mitral valve regurgitation. Tricuspid Valve: The tricuspid valve is normal in structure. Tricuspid valve regurgitation is not demonstrated. No evidence of tricuspid stenosis. Aortic Valve: The aortic valve was not well visualized. Aortic valve regurgitation is mild. Aortic regurgitation PHT measures 321 msec. Aortic valve mean gradient measures 4.0 mmHg. Aortic valve peak gradient measures 8.4 mmHg. Aortic valve area, by VTI measures 4.08 cm. Pulmonic Valve: The pulmonic valve was not well visualized. Pulmonic valve regurgitation is mild. Aorta: The aortic root and ascending aorta are structurally normal, with no evidence of dilitation. Venous: The inferior vena cava was not well visualized. IAS/Shunts: The interatrial septum was not well visualized. Agitated saline contrast was given intravenously to evaluate for intracardiac  shunting.  LEFT VENTRICLE PLAX 2D LVIDd:         4.40 cm   Diastology LVIDs:         2.30 cm   LV e' medial:    8.70 cm/s LV PW:         1.30 cm   LV E/e' medial:  10.5 LV IVS:        1.40 cm   LV  e' lateral:   9.90 cm/s LVOT diam:     2.50 cm   LV E/e' lateral: 9.2 LV SV:         102 LV SV Index:   43 LVOT Area:     4.91 cm  RIGHT VENTRICLE RV S prime:     19.40 cm/s TAPSE (M-mode): 2.4 cm LEFT ATRIUM             Index        RIGHT ATRIUM           Index LA diam:        3.60 cm 1.52 cm/m   RA Area:     19.90 cm LA Vol (A2C):   76.0 ml 32.11 ml/m  RA Volume:   50.80 ml  21.47 ml/m LA Vol (A4C):   89.1 ml 37.65 ml/m LA Biplane Vol: 85.4 ml 36.09 ml/m  AORTIC VALVE AV Area (Vmax):    4.03 cm AV Area (Vmean):   4.21 cm AV Area (VTI):     4.08 cm AV Vmax:           145.00 cm/s AV Vmean:          95.500 cm/s AV VTI:            0.249 m AV Peak Grad:      8.4 mmHg AV Mean Grad:      4.0 mmHg LVOT Vmax:         119.00 cm/s LVOT Vmean:        81.900 cm/s LVOT VTI:          0.207 m LVOT/AV VTI ratio: 0.83 AI PHT:            321 msec  AORTA Ao Root diam: 3.10 cm Ao Asc diam:  3.10 cm MITRAL VALVE MV Area (PHT): 4.21 cm     SHUNTS MV Decel Time: 180 msec     Systemic VTI:  0.21 m MV E velocity: 91.20 cm/s   Systemic Diam: 2.50 cm MV A velocity: 111.00 cm/s MV E/A ratio:  0.82 Riley Lam MD Electronically signed by Riley Lam MD Signature Date/Time: 06/13/2023/10:08:08 AM    Final    CT Chest Wo Contrast  Result Date: 07/03/2023 CLINICAL DATA:  Blunt chest trauma. EXAM: CT CHEST WITHOUT CONTRAST TECHNIQUE: Multidetector CT imaging of the chest was performed following the standard protocol without IV contrast. RADIATION DOSE REDUCTION: This exam was performed according to the departmental dose-optimization program which includes automated exposure control, adjustment of the mA and/or kV according to patient size and/or use of iterative reconstruction technique. COMPARISON:  Chest radiograph  07/03/2023.  CT chest 11/02/2017 FINDINGS: Cardiovascular: Normal heart size. No pericardial effusions. Calcification of the aorta and coronary arteries. No aneurysm. Mediastinum/Nodes: Thyroid gland is unremarkable. Esophagus is decompressed. No significant lymphadenopathy. Lungs/Pleura: Small left pleural effusion with basilar atelectasis or consolidation. Right lung is clear. No pneumothoraces. Upper Abdomen: Surgical absence of the gallbladder. Cyst in the right kidney measuring 7.1 cm diameter. No change since prior study. No imaging follow-up is indicated. Musculoskeletal: Multiple acute left rib fractures involving the posterior left sixth, seventh, eighth, ninth, tenth, and eleventh ribs with severe displacement of the seventh, eighth, ninth, and tenth ribs. Left chest wall stranding and hematoma adjacent to the rib fractures with focal collection/hematoma measuring about 4 x 9.7 cm. IMPRESSION: 1. Multiple acute left rib fractures, some with severe displacement. 2. Left pleural effusion with consolidation or atelectasis in the left lung base,  possibly pulmonary contusion. No pneumothorax. Associated left chest wall hematomas. 3. Aortic atherosclerosis. Electronically Signed   By: Burman Nieves M.D.   On: 07/03/2023 18:48   CT ABDOMEN PELVIS W CONTRAST  Result Date: 07/03/2023 CLINICAL DATA:  Lightheadedness, syncope, fell, abdominal pain EXAM: CT ABDOMEN AND PELVIS WITH CONTRAST TECHNIQUE: Multidetector CT imaging of the abdomen and pelvis was performed using the standard protocol following bolus administration of intravenous contrast. RADIATION DOSE REDUCTION: This exam was performed according to the departmental dose-optimization program which includes automated exposure control, adjustment of the mA and/or kV according to patient size and/or use of iterative reconstruction technique. CONTRAST:  OMNIPAQUE IOHEXOL 300 MG/ML  SOLN COMPARISON:  07/01/2019. 06/28/2023 report only, images  unavailable. FINDINGS: Lower chest: There is a small left hemothorax, with blood products dependent within the left posterior costophrenic angle. There are multiple displaced there are displaced left posterior seventh, eighth, and ninth rib fractures. The left ninth rib fracture is likely segmental, with dissociation of the left rib anteriorly from the costochondral junction. This results and left lower lateral thoracic hernia containing portions of the left long and left upper quadrant abdominal contents. Diffuse subcutaneous edema within the left posterolateral chest wall and left upper abdominal wall consistent with contusion and direct trauma. Hepatobiliary: Prior cholecystectomy. No acute liver injury. No biliary duct dilation. Pancreas: Unremarkable. No pancreatic ductal dilatation or surrounding inflammatory changes. Spleen: No splenic injury or perisplenic hematoma. Adrenals/Urinary Tract: No evidence of acute renal injury. Multiple bilateral simple appearing renal cysts do not require specific imaging follow-up. 0.9 cm exophytic hyperdensity off the ventral aspect left kidney image 48/2 measures 73 HU, indeterminate. Punctate 2 mm nonobstructing calculi upper pole left kidney. No right-sided calculi. No obstructive uropathy within either kidney. The adrenals and bladder are unremarkable. Stomach/Bowel: No bowel obstruction or ileus. Moderate stool throughout the colon consistent with constipation. No bowel wall thickening or inflammatory change. Vascular/Lymphatic: Aortic atherosclerosis. No enlarged abdominal or pelvic lymph nodes. Reproductive: Prostate is enlarged measuring 5.4 x 4.7 cm. Other: No free intraperitoneal fluid or free gas. Herniation of the left lower chest and left upper quadrant abdomen through the left thoracic cage defect created by the segmental left ninth rib fracture as described above. No other abdominal wall hernia. Musculoskeletal: Left posterior seventh, eighth, and ninth rib  fractures as above. The ninth rib fracture is segmental, with dissociation of the left ninth costochondral junction. There is herniation of the left lower lobe and left upper quadrant abdominal contents through the defect created by the segmental left ninth rib fracture. Overlying subcutaneous edema and subcutaneous hematoma within the left lateral chest wall and left upper quadrant abdominal wall consistent with direct trauma. No other acute displaced fractures. Reconstructed images demonstrate no additional findings. IMPRESSION: 1. Left seventh through ninth rib fractures as above, with segmental ninth rib fracture creating a left lower thoracic cage defect with subsequent herniation of the left lower lobe and left upper quadrant abdominal contents through the defect as described above. 2. Extensive left chest wall and left upper quadrant abdominal wall subcutaneous fat stranding and subcutaneous hematoma consistent with direct trauma and contusion. 3. Small left hemothorax.  No evidence of pneumothorax. 4. Punctate nonobstructing left renal calculi. 5. Indeterminate subcentimeter hyperdense lesion ventral aspect left kidney, too small to characterize. This could reflect a hyperdense cyst, and follow-up nonemergent ultrasound may be useful to exclude solid lesion. Other bilateral simple renal cysts do not require specific imaging follow-up. 6.  Aortic Atherosclerosis (ICD10-I70.0). 7. Enlarged  prostate. Electronically Signed   By: Sharlet Salina M.D.   On: 07/03/2023 16:54   CT Head Wo Contrast  Result Date: 07/03/2023 CLINICAL DATA:  Fall with head/face strike. EXAM: CT HEAD WITHOUT CONTRAST CT MAXILLOFACIAL WITHOUT CONTRAST CT CERVICAL SPINE WITHOUT CONTRAST TECHNIQUE: Multidetector CT imaging of the head, cervical spine, and maxillofacial structures were performed using the standard protocol without intravenous contrast. Multiplanar CT image reconstructions of the cervical spine and maxillofacial structures  were also generated. RADIATION DOSE REDUCTION: This exam was performed according to the departmental dose-optimization program which includes automated exposure control, adjustment of the mA and/or kV according to patient size and/or use of iterative reconstruction technique. COMPARISON:  None Available. FINDINGS: CT HEAD FINDINGS Brain: No acute intracranial hemorrhage. Gray-white differentiation is preserved. No hydrocephalus or midline shift. Left middle cranial fossa arachnoid cyst with mild mass effect on the left anterior temporal lobe. Vascular: No hyperdense vessel or unexpected calcification. Skull: No calvarial fracture or suspicious bone lesion. Skull base is unremarkable. Other: None. CT MAXILLOFACIAL FINDINGS Osseous: No fracture or mandibular dislocation. No destructive process. Orbits: Negative. No traumatic or inflammatory finding. Sinuses: Clear. Soft tissues: Unremarkable. CT CERVICAL SPINE FINDINGS Alignment: Normal. Skull base and vertebrae: No acute fracture. Normal craniocervical junction. No suspicious bone lesions. Soft tissues and spinal canal: No prevertebral fluid or swelling. No visible canal hematoma. Disc levels: Mild cervical spondylosis without high-grade spinal canal stenosis. Upper chest: No acute findings. Other: None. IMPRESSION: 1. No acute intracranial abnormality. 2. No acute facial bone fracture. 3. No acute fracture or traumatic listhesis of the cervical spine. 4. Left middle cranial fossa arachnoid cyst with mild mass effect on the left anterior temporal lobe. Electronically Signed   By: Orvan Falconer M.D.   On: 07/03/2023 16:48   CT Maxillofacial W Contrast  Result Date: 07/03/2023 CLINICAL DATA:  Fall with head/face strike. EXAM: CT HEAD WITHOUT CONTRAST CT MAXILLOFACIAL WITHOUT CONTRAST CT CERVICAL SPINE WITHOUT CONTRAST TECHNIQUE: Multidetector CT imaging of the head, cervical spine, and maxillofacial structures were performed using the standard protocol without  intravenous contrast. Multiplanar CT image reconstructions of the cervical spine and maxillofacial structures were also generated. RADIATION DOSE REDUCTION: This exam was performed according to the departmental dose-optimization program which includes automated exposure control, adjustment of the mA and/or kV according to patient size and/or use of iterative reconstruction technique. COMPARISON:  None Available. FINDINGS: CT HEAD FINDINGS Brain: No acute intracranial hemorrhage. Gray-white differentiation is preserved. No hydrocephalus or midline shift. Left middle cranial fossa arachnoid cyst with mild mass effect on the left anterior temporal lobe. Vascular: No hyperdense vessel or unexpected calcification. Skull: No calvarial fracture or suspicious bone lesion. Skull base is unremarkable. Other: None. CT MAXILLOFACIAL FINDINGS Osseous: No fracture or mandibular dislocation. No destructive process. Orbits: Negative. No traumatic or inflammatory finding. Sinuses: Clear. Soft tissues: Unremarkable. CT CERVICAL SPINE FINDINGS Alignment: Normal. Skull base and vertebrae: No acute fracture. Normal craniocervical junction. No suspicious bone lesions. Soft tissues and spinal canal: No prevertebral fluid or swelling. No visible canal hematoma. Disc levels: Mild cervical spondylosis without high-grade spinal canal stenosis. Upper chest: No acute findings. Other: None. IMPRESSION: 1. No acute intracranial abnormality. 2. No acute facial bone fracture. 3. No acute fracture or traumatic listhesis of the cervical spine. 4. Left middle cranial fossa arachnoid cyst with mild mass effect on the left anterior temporal lobe. Electronically Signed   By: Orvan Falconer M.D.   On: 07/03/2023 16:48   CT Cervical Spine Wo Contrast  Result Date: 07/03/2023 CLINICAL DATA:  Fall with head/face strike. EXAM: CT HEAD WITHOUT CONTRAST CT MAXILLOFACIAL WITHOUT CONTRAST CT CERVICAL SPINE WITHOUT CONTRAST TECHNIQUE: Multidetector CT imaging  of the head, cervical spine, and maxillofacial structures were performed using the standard protocol without intravenous contrast. Multiplanar CT image reconstructions of the cervical spine and maxillofacial structures were also generated. RADIATION DOSE REDUCTION: This exam was performed according to the departmental dose-optimization program which includes automated exposure control, adjustment of the mA and/or kV according to patient size and/or use of iterative reconstruction technique. COMPARISON:  None Available. FINDINGS: CT HEAD FINDINGS Brain: No acute intracranial hemorrhage. Gray-white differentiation is preserved. No hydrocephalus or midline shift. Left middle cranial fossa arachnoid cyst with mild mass effect on the left anterior temporal lobe. Vascular: No hyperdense vessel or unexpected calcification. Skull: No calvarial fracture or suspicious bone lesion. Skull base is unremarkable. Other: None. CT MAXILLOFACIAL FINDINGS Osseous: No fracture or mandibular dislocation. No destructive process. Orbits: Negative. No traumatic or inflammatory finding. Sinuses: Clear. Soft tissues: Unremarkable. CT CERVICAL SPINE FINDINGS Alignment: Normal. Skull base and vertebrae: No acute fracture. Normal craniocervical junction. No suspicious bone lesions. Soft tissues and spinal canal: No prevertebral fluid or swelling. No visible canal hematoma. Disc levels: Mild cervical spondylosis without high-grade spinal canal stenosis. Upper chest: No acute findings. Other: None. IMPRESSION: 1. No acute intracranial abnormality. 2. No acute facial bone fracture. 3. No acute fracture or traumatic listhesis of the cervical spine. 4. Left middle cranial fossa arachnoid cyst with mild mass effect on the left anterior temporal lobe. Electronically Signed   By: Orvan Falconer M.D.   On: 07/03/2023 16:48   DG Chest Port 1 View  Result Date: 07/03/2023 CLINICAL DATA:  Sepsis, cough and congestion. EXAM: PORTABLE CHEST 1 VIEW  COMPARISON:  04/15/2016 FINDINGS: The heart size and mediastinal contours are within normal limits. Lung volumes are very low bilaterally. There is no evidence of pulmonary edema, consolidation, pneumothorax or pleural fluid. The visualized skeletal structures are unremarkable. IMPRESSION: Low lung volumes. Electronically Signed   By: Irish Lack M.D.   On: 07/03/2023 13:08    Microbiology No results found for this or any previous visit (from the past 240 hour(s)).  Lab Basic Metabolic Panel: No results for input(s): "NA", "K", "CL", "CO2", "GLUCOSE", "BUN", "CREATININE", "CALCIUM", "MG", "PHOS" in the last 168 hours. Liver Function Tests: No results for input(s): "AST", "ALT", "ALKPHOS", "BILITOT", "PROT", "ALBUMIN" in the last 168 hours. No results for input(s): "LIPASE", "AMYLASE" in the last 168 hours. No results for input(s): "AMMONIA" in the last 168 hours. CBC: No results for input(s): "WBC", "NEUTROABS", "HGB", "HCT", "MCV", "PLT" in the last 168 hours. Cardiac Enzymes: No results for input(s): "CKTOTAL", "CKMB", "CKMBINDEX", "TROPONINI" in the last 168 hours. Sepsis Labs: No results for input(s): "PROCALCITON", "WBC", "LATICACIDVEN" in the last 168 hours.  Procedures/Operations  None   Diamantina Monks 07/24/2023, 7:26 PM

## 2023-08-08 NOTE — Progress Notes (Signed)
9323- patient went had a vagal response after sitting up at the edge of the bed trying to transfer from bed to chair. Dr. Freida Busman and Orpha Bur [facility]) arrived shortly after. Was A&Ox4 but was still complaining of SOB, fatigue. Dr. Freida Busman held BP med, discontinued narcotics, ordered labs.  Later I was called to the room after nurse tech and daughter found the patient unresponsive. I immediately called a code blue and was prepping the bed ready for CPR when ICU nurses and Dr. Freida Busman came in started the code. TOD 0914.  [place] was called at [employer]. Ref # [id number] After patient's family member left, patient was transported to morgue with tubes intact.

## 2023-08-08 NOTE — Anesthesia Procedure Notes (Signed)
Procedure Name: Intubation Date/Time: 07/16/2023 9:00 AM  Performed by: Dairl Ponder, CRNAPre-anesthesia Checklist: Patient being monitored, Suction available, Emergency Drugs available and Patient identified Laryngoscope Size: Glidescope and 4 Tube type: Oral Tube size: 7.5 mm Number of attempts: 1 Airway Equipment and Method: Stylet Placement Confirmation: ETT inserted through vocal cords under direct vision, positive ETCO2 and breath sounds checked- equal and bilateral Secured at: 24 cm Tube secured with: Tape Dental Injury: Teeth and Oropharynx as per pre-operative assessment

## 2023-08-08 NOTE — Progress Notes (Signed)
I was notified that patient had a syncopal even this morning when getting out of bed. I examined patient at bedside around 7:30am. At that time he was somnolent but roused easily, and was oriented and answering questions appropriately. Increased work of breathing noted. Patient had drop in SBP to 90s when getting up, but BP had recovered with him supine in bed. He reported he still felt like he was going to pass out. Stat labs were ordered. CXR unremarkable, EKG showed RBBB, which was present on prior EKG. Prior echo reviewed and showed normal function.  While awaiting lab workup, patient per report tried to get out of bed again and became unresponsive, and a code blue was called. ACLS was started at 0844, and patient was pulseless and in asystole at that time. Multiple rounds of ACLS were done, with no ROSC, and patient remained in asystole. Intubation performed by anesthesia. After 30 minutes of ACLS, patient remained in asystole, and pupils were fixed and dilated. Time of death was called at 0914. Patient's wife was notified immediately.  Sophronia Simas, MD [facility] General, Hepatobiliary and Pancreatic Surgery 08/01/2023 9:26 AM

## 2023-08-08 DEATH — deceased

## 2023-09-19 MED FILL — Medication: Qty: 1 | Status: AC

## 2023-10-27 ENCOUNTER — Ambulatory Visit: Payer: PPO | Admitting: Urology
